# Patient Record
Sex: Female | Born: 1975 | Race: Black or African American | Hispanic: No | Marital: Married | Smoking: Never smoker
Health system: Southern US, Community
[De-identification: ages and names within clinical notes are randomized; demographics above are authoritative.]

## PROBLEM LIST (undated history)

## (undated) ENCOUNTER — Inpatient Hospital Stay (HOSPITAL_COMMUNITY): Payer: Self-pay

## (undated) DIAGNOSIS — K219 Gastro-esophageal reflux disease without esophagitis: Secondary | ICD-10-CM

## (undated) DIAGNOSIS — J45909 Unspecified asthma, uncomplicated: Secondary | ICD-10-CM

## (undated) DIAGNOSIS — I10 Essential (primary) hypertension: Secondary | ICD-10-CM

## (undated) HISTORY — PX: APPENDECTOMY: SHX54

## (undated) HISTORY — PX: TONSILLECTOMY: SUR1361

## (undated) HISTORY — PX: TOOTH EXTRACTION: SUR596

---

## 2001-10-06 HISTORY — PX: TONSILLECTOMY: SUR1361

## 2011-12-05 HISTORY — PX: DILATION AND CURETTAGE OF UTERUS: SHX78

## 2012-08-18 DIAGNOSIS — T781XXA Other adverse food reactions, not elsewhere classified, initial encounter: Secondary | ICD-10-CM | POA: Insufficient documentation

## 2012-08-18 DIAGNOSIS — Z91010 Allergy to peanuts: Secondary | ICD-10-CM | POA: Insufficient documentation

## 2012-11-01 ENCOUNTER — Encounter: Payer: Self-pay | Admitting: Emergency Medicine

## 2012-11-01 ENCOUNTER — Emergency Department
Admission: EM | Admit: 2012-11-01 | Discharge: 2012-11-01 | Disposition: A | Discharge status: Home / Self Care | Payer: Federal, State, Local not specified - PPO | Source: Home / Self Care | Attending: Family Medicine | Admitting: Family Medicine

## 2012-11-01 DIAGNOSIS — J45909 Unspecified asthma, uncomplicated: Secondary | ICD-10-CM

## 2012-11-01 DIAGNOSIS — J0100 Acute maxillary sinusitis, unspecified: Secondary | ICD-10-CM

## 2012-11-01 HISTORY — DX: Unspecified asthma, uncomplicated: J45.909

## 2012-11-01 MED ORDER — BENZONATATE 200 MG PO CAPS: 200.0000 mg | ORAL_CAPSULE | Freq: Every day | ORAL | Status: DC

## 2012-11-01 MED ORDER — AMOXICILLIN 875 MG PO TABS: 875.0000 mg | ORAL_TABLET | Freq: Two times a day (BID) | ORAL | Status: DC

## 2012-11-01 MED ORDER — ALBUTEROL SULFATE HFA 108 (90 BASE) MCG/ACT IN AERS: 2.0000 | INHALATION_SPRAY | RESPIRATORY_TRACT | Status: DC | PRN

## 2012-11-01 MED ORDER — PREDNISONE 20 MG PO TABS: 20.0000 mg | ORAL_TABLET | Freq: Two times a day (BID) | ORAL | Status: DC

## 2012-11-01 NOTE — ED Provider Notes (Signed)
History     CSN: [id number]  Arrival date & time 11/01/12  1753   First MD Initiated Contact with Patient 11/01/12 1903      Chief Complaint  Patient presents with  . Sinus Problem      HPI Comments: Patient reports that she has had persistent sinus congestion for about a month, and uses Rhinocort regularly.  She has asthma, and over the past four days has developed increased sinus congestion and facial pain, cough and increased wheezing, and chills.  The history is provided by the patient.    Past Medical History  Diagnosis Date  . Asthma     Past Surgical History  Procedure Date  . Tonsillectomy     No family history on file.  History  Substance Use Topics  . Smoking status: Never Smoker   . Smokeless tobacco: Not on file  . Alcohol Use: No    OB History    Grav Para Term Preterm Abortions TAB SAB Ect Mult Living                  Review of Systems + sore throat + cough No pleuritic pain + wheezing + nasal congestion + post-nasal drainage + sinus pain/pressure No itchy/red eyes No earache No hemoptysis + SOB No fever, + chills No nausea No vomiting No abdominal pain No diarrhea No urinary symptoms No skin rashes + fatigue + myalgias + headache Used OTC meds without relief  Allergies  Compazine and Latex  Home Medications   Current Outpatient Rx  Name  Route  Sig  Dispense  Refill  . ALBUTEROL SULFATE (2.5 MG/3ML) 0.083% IN NEBU   Nebulization   Take 2.5 mg by nebulization every 6 (six) hours as needed.         Marland Kitchen FLUTICASONE-SALMETEROL 100-50 MCG/DOSE IN AEPB   Inhalation   Inhale 1 puff into the lungs every 12 (twelve) hours.         Marland Kitchen HYDROCHLOROTHIAZIDE 25 MG PO TABS   Oral   Take 25 mg by mouth daily.         Marland Kitchen LORATADINE 10 MG PO TABS   Oral   Take 10 mg by mouth daily.         Marland Kitchen MONTELUKAST SODIUM 10 MG PO TABS   Oral   Take 10 mg by mouth at bedtime.         . ALBUTEROL SULFATE HFA 108 (90 BASE) MCG/ACT IN  AERS   Inhalation   Inhale 2 puffs into the lungs every 4 (four) hours as needed for wheezing.   1 Inhaler   1     Prefer Ventolin   . AMOXICILLIN 875 MG PO TABS   Oral   Take 1 tablet (875 mg total) by mouth 2 (two) times daily.   20 tablet   0   . BENZONATATE 200 MG PO CAPS   Oral   Take 1 capsule (200 mg total) by mouth at bedtime. Take as needed for cough   15 capsule   0   . PREDNISONE 20 MG PO TABS   Oral   Take 1 tablet (20 mg total) by mouth 2 (two) times daily.   10 tablet   0     BP 104/71  Pulse 70  Temp 98.4 F (36.9 C) (Oral)  Resp 16  Ht 5' 6.5" (1.689 m)  Wt 141 lb (63.957 kg)  BMI 22.42 kg/m2  SpO2 99%  LMP 10/18/2012  Physical Exam  Nursing notes and Vital Signs reviewed. Appearance:  Patient appears healthy, stated age, and in no acute distress Eyes:  Pupils are equal, round, and reactive to light and accomodation.  Extraocular movement is intact.  Conjunctivae are not inflamed  Ears:  Canals normal.  Tympanic membranes normal.  Nose:  Mildly congested turbinates.  Maxillary sinus tenderness is present.  Pharynx:  Normal Neck:  Supple.  Slightly tender shotty posterior nodes are palpated bilaterally  Lungs:  Clear to auscultation.  Breath sounds are equal.  Heart:  Regular rate and rhythm without murmurs, rubs, or gallops.  Abdomen:  Nontender without masses or hepatosplenomegaly.  Bowel sounds are present.  No CVA or flank tenderness.  Extremities:  No edema.  No calf tenderness Skin:  No rash present.   ED Course  Procedures  none      1. Acute maxillary sinusitis   2. Asthma       MDM  Begin amoxicillin and prednisone burst.  Prescription written for Benzonatate (Tessalon) to take at bedtime for night-time cough.  Take Mucinex D (guaifenesin with decongestant) twice daily for congestion.  Increase fluid intake, rest. May use Afrin nasal spray (or generic oxymetazoline) twice daily for about 5 days.  Also recommend using saline  nasal spray several times daily and saline nasal irrigation (AYR is a common brand).  Use Rhinocort nasal spray after Afrin and saline rinse. Stop all antihistamines for now, and other non-prescription cough/cold preparations. Continue all inhalers Follow-up with family doctor if not improving 7 to 10 days.         [name], MD 11/05/12 [phone]

## 2012-11-01 NOTE — ED Notes (Signed)
Sinus Pain, pressure, pressure behind eyes, headache, body aches, congestion, chills, SOB x 4 days

## 2012-11-16 ENCOUNTER — Encounter: Payer: Self-pay | Admitting: Family Medicine

## 2012-11-16 ENCOUNTER — Ambulatory Visit (INDEPENDENT_AMBULATORY_CARE_PROVIDER_SITE_OTHER): Payer: Federal, State, Local not specified - PPO | Admitting: Family Medicine

## 2012-11-16 VITALS — BP 118/73 | HR 91 | Ht 66.0 in | Wt 146.0 lb

## 2012-11-16 DIAGNOSIS — J45909 Unspecified asthma, uncomplicated: Secondary | ICD-10-CM

## 2012-11-16 DIAGNOSIS — J309 Allergic rhinitis, unspecified: Secondary | ICD-10-CM | POA: Insufficient documentation

## 2012-11-16 DIAGNOSIS — F419 Anxiety disorder, unspecified: Secondary | ICD-10-CM | POA: Insufficient documentation

## 2012-11-16 DIAGNOSIS — A499 Bacterial infection, unspecified: Secondary | ICD-10-CM

## 2012-11-16 DIAGNOSIS — J329 Chronic sinusitis, unspecified: Secondary | ICD-10-CM

## 2012-11-16 DIAGNOSIS — F32A Depression, unspecified: Secondary | ICD-10-CM | POA: Insufficient documentation

## 2012-11-16 DIAGNOSIS — M5030 Other cervical disc degeneration, unspecified cervical region: Secondary | ICD-10-CM | POA: Insufficient documentation

## 2012-11-16 DIAGNOSIS — K589 Irritable bowel syndrome without diarrhea: Secondary | ICD-10-CM | POA: Insufficient documentation

## 2012-11-16 DIAGNOSIS — I10 Essential (primary) hypertension: Secondary | ICD-10-CM

## 2012-11-16 DIAGNOSIS — B9689 Other specified bacterial agents as the cause of diseases classified elsewhere: Secondary | ICD-10-CM

## 2012-11-16 DIAGNOSIS — J4550 Severe persistent asthma, uncomplicated: Secondary | ICD-10-CM | POA: Insufficient documentation

## 2012-11-16 DIAGNOSIS — J4540 Moderate persistent asthma, uncomplicated: Secondary | ICD-10-CM | POA: Insufficient documentation

## 2012-11-16 MED ORDER — THEOPHYLLINE ER 400 MG PO CP24: 400.0000 mg | ORAL_CAPSULE | Freq: Every day | ORAL | Status: DC

## 2012-11-16 MED ORDER — LEVOFLOXACIN 500 MG PO TABS: 500.0000 mg | ORAL_TABLET | Freq: Every day | ORAL | Status: AC

## 2012-11-16 MED ORDER — ALBUTEROL SULFATE HFA 108 (90 BASE) MCG/ACT IN AERS: 2.0000 | INHALATION_SPRAY | RESPIRATORY_TRACT | Status: DC | PRN

## 2012-11-16 MED ORDER — HYDROCHLOROTHIAZIDE 25 MG PO TABS: 25.0000 mg | ORAL_TABLET | Freq: Every day | ORAL | Status: DC

## 2012-11-16 MED ORDER — FLUTICASONE-SALMETEROL 500-50 MCG/DOSE IN AEPB: 1.0000 | INHALATION_SPRAY | Freq: Two times a day (BID) | RESPIRATORY_TRACT | Status: DC

## 2012-11-16 MED ORDER — MONTELUKAST SODIUM 10 MG PO TABS: 10.0000 mg | ORAL_TABLET | Freq: Every day | ORAL | Status: DC

## 2012-11-16 NOTE — Progress Notes (Signed)
CC: Toni Harmon is a 37 y.o. female is here for Establish Toni   Subjective: HPI:  Toni Harmon here to establish Toni. Toni Harmon.  History of asthma: This is been present ever since her teens. She's currently on Advair, theophylline, Singulair and albuterol. She reports no need to use albuterol at night or while sleeping. She reports using it twice a day on a scheduled regimen.  Triggers include pollen and dust environments such as her work place at the [employer]. This is improved if she wears a mask or respirator. She's never been hospitalized for asthma, she is never been intubated for asthma. Her immunologist has told her that they may take her off of theophylline in the near future. She was on prednisone for a sinus infection last week. She denies wheezing, cough, chest congestion, wheezing, chest pain, lightheadedness. She denies tremor, nor unintentional weight loss. Interestingly she was given a diagnosis for hypertension around the time she was started on theophylline.  History of irritable bowel syndrome: She reports almost weekly episodes of abdominal pain that is improved with bowel movements. She reports both constipation and loose stools with the above symptoms. She has noticed improvement in her symptoms ever since cutting back on gluten and increasing vegetables in her diet. She does not take a fiber supplement. She once tried MiraLax but made symptoms worse from a dietary standpoint. She denies waking with abdominal pain at night, blood in the stool,tar like stools, food aversions, nausea, nor vomiting.  History allergic rhinitis: Currently taking Zyrtec and Rhinocort on a daily basis. Continues to have pressure underneath both eyes it is worse when lying down flat. Moderate in severity. No improvement whatsoever on amoxicillin 2 weeks ago. She reports clear to thick nasal discharge. She is using nasal saline which helps with the pain  in the face, nothing else makes better or worse.   essential hypertension: No outside blood pressures to report. Taking hydrochlorothiazide twice a day for years now. She believes her blood pressure has been fantastic ever since starting this medication.   Review of Systems - General ROS: negative for - chills, fever, night sweats, weight gain or weight loss Ophthalmic ROS: negative for - decreased vision Psychological ROS: negative for - anxiety or depression ENT ROS: negative for - hearing change tinnitus  Hematological and Lymphatic ROS: negative for - bleeding problems, bruising or swollen lymph nodes Breast ROS: negative Respiratory ROS: no cough, shortness of breath, or wheezing Cardiovascular ROS: no chest pain or dyspnea on exertion Gastrointestinal ROS: no  black or bloody stools Genito-Urinary ROS: negative for - genital discharge, genital ulcers, incontinence or abnormal bleeding from genitals Musculoskeletal ROS: negative for - joint pain or muscle pain Neurological ROS: negative for - headaches or memory loss Dermatological ROS: negative for lumps, mole changes, rash and skin lesion changes  Past Medical History  Diagnosis Date  . Asthma      No family history on file.   History  Substance Use Topics  . Smoking status: Never Smoker   . Smokeless tobacco: Not on file  . Alcohol Use: No     Objective: Filed Vitals:   11/16/12 1319  BP: 118/73  Pulse: 91    General: Alert and Oriented, No Acute Distress HEENT: Pupils equal, round, reactive to light. Conjunctivae clear.  External ears unremarkable, canals clear with intact TMs with appropriate landmarks.  Middle ear appears open without effusion. erythematous and boggy inferior turbinates with mild mucoid  discharge.  Moist mucous membranes, pharynx without inflammation nor lesions.  Neck supple without palpable lymphadenopathy nor abnormal masses. Lungs: Clear to auscultation bilaterally, no wheezing/ronchi/rales.   Comfortable work of breathing. Good air movement. Cardiac: Regular rate and rhythm. Normal S1/S2.  No murmurs, rubs, nor gallops.   Extremities: No peripheral edema.  Strong peripheral pulses.  Mental Status: No depression, anxiety, nor agitation. Skin: Warm and dry.  Assessment & Plan: Artesha was seen today for establish Toni.  Diagnoses and associated orders for this visit:  Essential hypertension, benign - hydrochlorothiazide (HYDRODIURIL) 25 MG tablet; Take 1 tablet (25 mg total) by mouth daily.  Asthma, chronic - albuterol (PROVENTIL HFA;VENTOLIN HFA) 108 (90 BASE) MCG/ACT inhaler; Inhale 2 puffs into the lungs every 4 (four) hours as needed for wheezing. Please supply Ventolin HFA - Fluticasone-Salmeterol (ADVAIR DISKUS) 500-50 MCG/DOSE AEPB; Inhale 1 puff into the lungs 2 (two) times daily. - montelukast (SINGULAIR) 10 MG tablet; Take 1 tablet (10 mg total) by mouth at bedtime. - theophylline (THEO-24) 400 MG 24 hr capsule; Take 1 capsule (400 mg total) by mouth daily.  Allergic rhinitis  IBS (irritable bowel syndrome)  Bacterial sinusitis - levofloxacin (LEVAQUIN) 500 MG tablet; Take 1 tablet (500 mg total) by mouth daily.  Other Orders - Discontinue: Fluticasone-Salmeterol (ADVAIR DISKUS) 500-50 MCG/DOSE AEPB; Inhale 1 puff into the lungs 2 (two) times daily. - Discontinue: theophylline (THEO-24) 400 MG 24 hr capsule; Take 1 capsule (400 mg total) by mouth daily.    Essential hypertension: Controlled, continue hydrochlorothiazide, discussed that she may be able to stop this once theophylline was discontinued. Asthma: Controlled and stable, continue albuterol, Singulair, Advair, he often however encouraged her to stop this under the guidance of her immunologist if the opportunity arises. Allergic rhinitis: Stable continue Zyrtec and Rhinocort IBS: Stable, strongly encouraged patient to add one heaping tablespoon of psyllum  powder to her diet daily to help minimize  future exacerbation frequency and severity Bacterial sinusitis: Deteriorated, we'll stepup therapy beyond amoxicillin, start Levaquin, continue nasal saline washes  FMLA paperwork was filled out for her asthma and IBS.  45 minutes spent face-to-face during visit today of which at least 50% was counseling or coordinating Toni regarding allergic rhinitis, asthma, IBS, bacterial sinusitis, essential hypertension.  Will hold off on labs until recent blood work is obtained from outside records to minimize unnecessary lab work  Return in about 3 months (around 02/13/2013).

## 2013-04-12 ENCOUNTER — Inpatient Hospital Stay (HOSPITAL_COMMUNITY): Payer: Federal, State, Local not specified - PPO

## 2013-04-12 ENCOUNTER — Encounter (HOSPITAL_COMMUNITY): Payer: Self-pay | Admitting: *Deleted

## 2013-04-12 ENCOUNTER — Inpatient Hospital Stay (HOSPITAL_COMMUNITY)
Admission: AD | Admit: 2013-04-12 | Discharge: 2013-04-12 | Disposition: A | Discharge status: Home / Self Care | Payer: Federal, State, Local not specified - PPO | Source: Ambulatory Visit | Attending: Family Medicine | Admitting: Family Medicine

## 2013-04-12 DIAGNOSIS — R109 Unspecified abdominal pain: Secondary | ICD-10-CM | POA: Insufficient documentation

## 2013-04-12 DIAGNOSIS — O039 Complete or unspecified spontaneous abortion without complication: Secondary | ICD-10-CM | POA: Insufficient documentation

## 2013-04-12 HISTORY — DX: Essential (primary) hypertension: I10

## 2013-04-12 HISTORY — DX: Gastro-esophageal reflux disease without esophagitis: K21.9

## 2013-04-12 LAB — CBC
HCT: 37.7 % (ref 36.0–46.0)
MCH: 28.5 pg (ref 26.0–34.0)
MCHC: 34.2 g/dL (ref 30.0–36.0)
MCV: 83.4 fL (ref 78.0–100.0)
Platelets: 336 10*3/uL (ref 150–400)
RDW: 12.7 % (ref 11.5–15.5)

## 2013-04-12 MED ORDER — IBUPROFEN 600 MG PO TABS: 600.0000 mg | ORAL_TABLET | Freq: Four times a day (QID) | ORAL | Status: DC | PRN

## 2013-04-12 NOTE — MAU Provider Note (Signed)
Chart reviewed and agree with management and plan.  

## 2013-04-12 NOTE — MAU Note (Addendum)
Pt G3 P0 , LMP 02/17/2013, +UPT, started spotting 1 wk ago, was seen at [facility], had an U/S performed, "everything looked good."  Cyst on left ovary was noted.  Bleeding and cramping increased yesterday.

## 2013-04-12 NOTE — MAU Note (Signed)
Bleeding and cramping started one week ago and worsened five days ago.  Says she has been bleeding moderately, approximately one pad/hour since last Wednesday.

## 2013-04-12 NOTE — MAU Provider Note (Signed)
Chief Complaint: Vaginal Bleeding and Abdominal Cramping   First Provider Initiated Contact with Patient 04/12/13 0216     SUBJECTIVE HPI: Toni Harmon is a 37 y.o. G3P0020 at 7.5 weeks by LMP who presents with moderate vaginal bleeding and low abd/back cramping. Bleeding now heavier than the beginning of a period, although minimal since arrival to MAU. She was seen at [facility] 1 week ago for spotting. States US showed intrauterine pregnancy, but baby was not visible. States she had blood work, pelvic exam, cultures done.   Past Medical History  Diagnosis Date  . Asthma   . GERD (gastroesophageal reflux disease)   . Hypertension     only to combat the effects of hctz   OB History   Grav Para Term Preterm Abortions TAB SAB Ect Mult Living   3    2 1 1    0     # Outc Date GA Lbr Len/2nd Wgt Sex Del Anes PTL Lv   1 TAB            2 SAB            3 CUR              Past Surgical History  Procedure Laterality Date  . Tonsillectomy     History   Social History  . Marital Status: Single    Spouse Name: N/A    Number of Children: N/A  . Years of Education: N/A   Occupational History  . Not on file.   Social History Main Topics  . Smoking status: Never Smoker   . Smokeless tobacco: Not on file  . Alcohol Use: No  . Drug Use: No  . Sexually Active: Not Currently    Birth Control/ Protection: None   Other Topics Concern  . Not on file   Social History Narrative  . No narrative on file   No current facility-administered medications on file prior to encounter.   Current Outpatient Prescriptions on File Prior to Encounter  Medication Sig Dispense Refill  . albuterol (PROVENTIL HFA;VENTOLIN HFA) 108 (90 BASE) MCG/ACT inhaler Inhale 2 puffs into the lungs every 4 (four) hours as needed for wheezing. Please supply Ventolin HFA  1 Inhaler  1  . Fluticasone-Salmeterol (ADVAIR DISKUS) 500-50 MCG/DOSE AEPB Inhale 1 puff into the lungs 2 (two) times  daily.  1 each  3  . hydrochlorothiazide (HYDRODIURIL) 25 MG tablet Take 1 tablet (25 mg total) by mouth daily.  90 tablet  3  . loratadine (CLARITIN) 10 MG tablet Take 10 mg by mouth daily.      . montelukast (SINGULAIR) 10 MG tablet Take 1 tablet (10 mg total) by mouth at bedtime.  90 tablet  3  . albuterol (PROVENTIL) (2.5 MG/3ML) 0.083% nebulizer solution Take 2.5 mg by nebulization every 6 (six) hours as needed.      . theophylline (THEO-24) 400 MG 24 hr capsule Take 1 capsule (400 mg total) by mouth daily.  90 capsule  1   Allergies  Allergen Reactions  . Compazine (Prochlorperazine Edisylate)   . Latex   . Peanut-Containing Drug Products     ROS: Pertinent positive items in HPI. Neg for passage of clots or tissue, fever, chills, vaginal discharge, urinary complaints, GI complaints.   OBJECTIVE Blood pressure 112/64, pulse 80, temperature 97.9 F (36.6 C), temperature source Oral, resp. rate 16, height 5' 6.8" (1.697 m), weight 72.576 kg (160 lb), last menstrual period 02/17/2013. GENERAL: Well-developed,  well-nourished female in no acute distress.  HEENT: Normocephalic HEART: normal rate RESP: normal effort ABDOMEN: Soft, non-tender EXTREMITIES: Nontender, no edema NEURO: Alert and oriented SPECULUM EXAM: NEFG, small amount of dark red blood noted in vault. Scant active bleeding from os. Cervix clean. BIMANUAL: cervix closed; uterus ? Slightly enlarged, no adnexal tenderness or masses. No CMT. Declined GC/CT cultures.  LAB RESULTS Results for orders placed during the hospital encounter of 04/12/13 (from the past 24 hour(s))  POCT PREGNANCY, URINE     Status: Abnormal   Collection Time    04/12/13  1:53 AM      Result Value Range   Preg Test, Ur POSITIVE (*) NEGATIVE  ABO/RH     Status: None   Collection Time    04/12/13  1:55 AM      Result Value Range   ABO/RH(D) B POS    CBC     Status: None   Collection Time    04/12/13  1:55 AM      Result Value Range   WBC 5.3   4.0 - 10.5 K/uL   RBC 4.52  3.87 - 5.11 MIL/uL   Hemoglobin 12.9  12.0 - 15.0 g/dL   HCT 45.4  09.8 - 11.9 %   MCV 83.4  78.0 - 100.0 fL   MCH 28.5  26.0 - 34.0 pg   MCHC 34.2  30.0 - 36.0 g/dL   RDW 14.7  82.9 - 56.2 %   Platelets 336  150 - 400 K/uL  HCG, QUANTITATIVE, PREGNANCY     Status: Abnormal   Collection Time    04/12/13  1:55 AM      Result Value Range   hCG, Beta Chain, Quant, S 192 (*) <5 mIU/mL    IMAGING US Ob Comp Less 14 Wks  04/12/2013   *RADIOLOGY REPORT*  Clinical Data: Early pregnancy with cramping and bleeding. Estimated gestational age by LMP is 7 weeks 5 days.  Quantitative beta HCG is 192.  OBSTETRIC <14 WK [place] AND TRANSVAGINAL OB US  Technique:  Both transabdominal and transvaginal ultrasound examinations were performed for complete evaluation of the gestation as well as the maternal uterus, adnexal regions, and pelvic cul-de-sac.  Transvaginal technique was performed to assess early pregnancy.  Comparison:  None.  Intrauterine gestational sac:  Multiple tiny cysts are demonstrated in the endometrium.  One or more of these could be early gestational sacs but are not definitive. Yolk sac: Not visualized Embryo: Not visualized Cardiac Activity: Not visualized  Maternal uterus/adnexae: Uterine myometrium appears homogeneous.  No myometrial mass lesions.  Both ovaries are visualized.  The right ovary measures 3 x 1.6 x 2 cm.  The left ovary measures 3.1 x 1.8 x 1.7 cm.  Normal follicular changes are demonstrated bilaterally.  No abnormal adnexal masses.  No free pelvic fluid collections.  IMPRESSION: Several small fluid collections within the endometrium are nonspecific.  Intrauterine pregnancy is not confirmed.  Follow up ultrasound is recommended in 10 days.   Original Report Authenticated By: Burman Nieves, M.D.   US Ob Transvaginal  04/12/2013   *RADIOLOGY REPORT*  Clinical Data: Early pregnancy with cramping and bleeding. Estimated gestational age by LMP is 7 weeks 5  days.  Quantitative beta HCG is 192.  OBSTETRIC <14 WK [place] AND TRANSVAGINAL OB US  Technique:  Both transabdominal and transvaginal ultrasound examinations were performed for complete evaluation of the gestation as well as the maternal uterus, adnexal regions, and pelvic cul-de-sac.  Transvaginal technique was  performed to assess early pregnancy.  Comparison:  None.  Intrauterine gestational sac:  Multiple tiny cysts are demonstrated in the endometrium.  One or more of these could be early gestational sacs but are not definitive. Yolk sac: Not visualized Embryo: Not visualized Cardiac Activity: Not visualized  Maternal uterus/adnexae: Uterine myometrium appears homogeneous.  No myometrial mass lesions.  Both ovaries are visualized.  The right ovary measures 3 x 1.6 x 2 cm.  The left ovary measures 3.1 x 1.8 x 1.7 cm.  Normal follicular changes are demonstrated bilaterally.  No abnormal adnexal masses.  No free pelvic fluid collections.  IMPRESSION: Several small fluid collections within the endometrium are nonspecific.  Intrauterine pregnancy is not confirmed.  Follow up ultrasound is recommended in 10 days.   Original Report Authenticated By: Burman Nieves, M.D.   MAU COURSE Records requested from [facility].  Per [facility] records 04/04/13:  Quant 4073  [place]: 5.5 week GS. No YS or FP. Left CLC. Multiple cystic areas in the endometrium.   ASSESSMENT 1. Miscarriage- IUP not confirmed    PLAN Discharge home in stable condition. Support given. Bleeding, pain , fever precautions.      Follow-up Information   Follow up with [facility] In 1 week. (or you Ob/Gyn office to check pregnancy hormone level.)    Contact information:   [address] [place] [address] [phone]      Follow up with [facility] [place] [facility] [place] MATERNITY ADMISSIONS. (As needed for severe bleeding, pain or fever greater than 100.4. )    Contact information:   [address] [id number] [place] [address] [phone]       Medication List         albuterol 108 (90 BASE) MCG/ACT inhaler  Commonly known as:  PROVENTIL HFA;VENTOLIN HFA  Inhale 2 puffs into the lungs every 4 (four) hours as needed for wheezing. Please supply Ventolin HFA     albuterol (2.5 MG/3ML) 0.083% nebulizer solution  Commonly known as:  PROVENTIL  Take 2.5 mg by nebulization every 6 (six) hours as needed.     Fluticasone-Salmeterol 500-50 MCG/DOSE Aepb  Commonly known as:  ADVAIR DISKUS  Inhale 1 puff into the lungs 2 (two) times daily.     hydrochlorothiazide 25 MG tablet  Commonly known as:  HYDRODIURIL  Take 1 tablet (25 mg total) by mouth daily.     ibuprofen 600 MG tablet  Commonly known as:  ADVIL,MOTRIN  Take 1 tablet (600 mg total) by mouth every 6 (six) hours as needed for pain.     loratadine 10 MG tablet  Commonly known as:  CLARITIN  Take 10 mg by mouth daily.     montelukast 10 MG tablet  Commonly known as:  SINGULAIR  Take 1 tablet (10 mg total) by mouth at bedtime.     pseudoephedrine 30 MG tablet  Commonly known as:  SUDAFED  Take 30 mg by mouth every 4 (four) hours as needed for congestion.     theophylline 400 MG 24 hr capsule  Commonly known as:  THEO-24  Take 1 capsule (400 mg total) by mouth daily.       [place], CNM 04/12/2013  3:40 AM

## 2013-04-22 ENCOUNTER — Other Ambulatory Visit: Payer: Federal, State, Local not specified - PPO

## 2013-04-22 DIAGNOSIS — O039 Complete or unspecified spontaneous abortion without complication: Secondary | ICD-10-CM

## 2013-04-23 LAB — HCG, QUANTITATIVE, PREGNANCY: hCG, Beta Chain, Quant, S: 4.8 m[IU]/mL

## 2013-04-28 ENCOUNTER — Ambulatory Visit (INDEPENDENT_AMBULATORY_CARE_PROVIDER_SITE_OTHER): Payer: Federal, State, Local not specified - PPO | Admitting: Family Medicine

## 2013-04-28 ENCOUNTER — Encounter: Payer: Self-pay | Admitting: Family Medicine

## 2013-04-28 VITALS — BP 110/77 | HR 71 | Temp 97.9°F | Ht 66.0 in | Wt 158.7 lb

## 2013-04-28 DIAGNOSIS — O039 Complete or unspecified spontaneous abortion without complication: Secondary | ICD-10-CM

## 2013-04-28 NOTE — Progress Notes (Signed)
Subjective:     Patient ID: Blanchard Kelch, female   DOB: 11-Mar-1976, 37 y.o.   MRN: [id number]  HPI  Ms. Franzoni is a 37 yo G3P0030 who is here for f/u HCG.    Was seen in MAU on 7/8 at [redacted]w[redacted]d for severe cramping and bleeding.  [place] at that time showed Several small fluid collections within the endometrium are  nonspecific. Intrauterine pregnancy is not confirmed. Quant 192  Prior quant and [place] Per [facility] records 04/04/13:  Quant 4073  [place]: 5.5 week GS. No YS or FP. Left CLC. Multiple cystic areas in the endometrium.  Was to follow up in a week with [place] for repeat Quant but did not. Stopped bleeding on 7/9. Has done well since then, feels physically normal. Emotionally doing well but sometimes still feeling down.  Taking a break for a couple months and going to use condoms and then try again.      Review of Systems No fevers, chills, abd pain, vaginal bleeding.  No headaches, sob.     Objective:   Physical Exam  Filed Vitals:   04/28/13 1545  BP: 110/77  Pulse: 71  Temp: 97.9 F (36.6 C)  Height: 5\' 6"  (1.676 m)  Weight: 158 lb 11.2 oz (71.986 kg)   GEN: NAD ABD: soft, NT, ND.  GU: deferred     Assessment:     Spontaneous abortion in first trimester - Plan: B-HCG Quant       Plan:     - completed abortion.  Based on [place] and story this may have been more a bligthed ovum - will repeat HCG  - discussed options for contraception - pt opted for condoms - may need further infertility workup in the future should she choose to pursue this.   Yanci Bachtell, [place] Baseman, MD

## 2013-04-28 NOTE — Patient Instructions (Signed)
Miscarriage A miscarriage is the sudden loss of an unborn baby (fetus) before the 20th week of pregnancy. Most miscarriages happen in the first 3 months of pregnancy. Sometimes, it happens before a woman even knows she is pregnant. A miscarriage is also called a "spontaneous miscarriage" or "early pregnancy loss." Having a miscarriage can be an emotional experience. Talk with your caregiver about any questions you may have about miscarrying, the grieving process, and your future pregnancy plans. CAUSES   Problems with the fetal chromosomes that make it impossible for the baby to develop normally. Problems with the baby's genes or chromosomes are most often the result of errors that occur, by chance, as the embryo divides and grows. The problems are not inherited from the parents.  Infection of the cervix or uterus.   Hormone problems.   Problems with the cervix, such as having an incompetent cervix. This is when the tissue in the cervix is not strong enough to hold the pregnancy.   Problems with the uterus, such as an abnormally shaped uterus, uterine fibroids, or congenital abnormalities.   Certain medical conditions.   Smoking, drinking alcohol, or taking illegal drugs.   Trauma.  Often, the cause of a miscarriage is unknown.  SYMPTOMS   Vaginal bleeding or spotting, with or without cramps or pain.  Pain or cramping in the abdomen or lower back.  Passing fluid, tissue, or blood clots from the vagina. DIAGNOSIS  Your caregiver will perform a physical exam. You may also have an ultrasound to confirm the miscarriage. Blood or urine tests may also be ordered. TREATMENT   Sometimes, treatment is not necessary if you naturally pass all the fetal tissue that was in the uterus. If some of the fetus or placenta remains in the body (incomplete miscarriage), tissue left behind may become infected and must be removed. Usually, a dilation and curettage (D and C) procedure is performed.  During a D and C procedure, the cervix is widened (dilated) and any remaining fetal or placental tissue is gently removed from the uterus.  Antibiotic medicines are prescribed if there is an infection. Other medicines may be given to reduce the size of the uterus (contract) if there is a lot of bleeding.  If you have Rh negative blood and your baby was Rh positive, you will need a Rh immunoglobulin shot. This shot will protect any future baby from having Rh blood problems in future pregnancies. HOME CARE INSTRUCTIONS   Your caregiver may order bed rest or may allow you to continue light activity. Resume activity as directed by your caregiver.  Have someone help with home and family responsibilities during this time.   Keep track of the number of sanitary pads you use each day and how soaked (saturated) they are. Write down this information.   Do not use tampons. Do not douche or have sexual intercourse until approved by your caregiver.   Only take over-the-counter or prescription medicines for pain or discomfort as directed by your caregiver.   Do not take aspirin. Aspirin can cause bleeding.   Keep all follow-up appointments with your caregiver.   If you or your partner have problems with grieving, talk to your caregiver or seek counseling to help cope with the pregnancy loss. Allow enough time to grieve before trying to get pregnant again.  SEEK IMMEDIATE MEDICAL CARE IF:   You have severe cramps or pain in your back or abdomen.  You have a fever.  You pass large blood clots (walnut-sized   or larger) ortissue from your vagina. Save any tissue for your caregiver to inspect.   Your bleeding increases.   You have a thick, bad-smelling vaginal discharge.  You become lightheaded, weak, or you faint.   You have chills.  MAKE SURE YOU:  Understand these instructions.  Will watch your condition.  Will get help right away if you are not doing well or get  worse. Document Released: 03/18/2001 Document Revised: 03/23/2012 Document Reviewed: 11/11/2011 [employer] Patient Information 2014 [employer].  

## 2013-04-29 ENCOUNTER — Other Ambulatory Visit: Payer: Federal, State, Local not specified - PPO

## 2013-10-26 ENCOUNTER — Ambulatory Visit (INDEPENDENT_AMBULATORY_CARE_PROVIDER_SITE_OTHER): Payer: Federal, State, Local not specified - PPO | Admitting: Family Medicine

## 2013-10-26 ENCOUNTER — Encounter: Payer: Self-pay | Admitting: Family Medicine

## 2013-10-26 VITALS — BP 126/82 | HR 74 | Temp 97.8°F | Wt 151.0 lb

## 2013-10-26 DIAGNOSIS — J45909 Unspecified asthma, uncomplicated: Secondary | ICD-10-CM

## 2013-10-26 DIAGNOSIS — J309 Allergic rhinitis, unspecified: Secondary | ICD-10-CM

## 2013-10-26 MED ORDER — AMBULATORY NON FORMULARY MEDICATION: Status: DC

## 2013-10-26 MED ORDER — MONTELUKAST SODIUM 10 MG PO TABS: 10.0000 mg | ORAL_TABLET | Freq: Every day | ORAL | Status: DC

## 2013-10-26 MED ORDER — LEVOCETIRIZINE DIHYDROCHLORIDE 2.5 MG/5ML PO SOLN: ORAL | Status: DC

## 2013-10-26 MED ORDER — FLUTICASONE-SALMETEROL 500-50 MCG/DOSE IN AEPB
1.0000 | INHALATION_SPRAY | Freq: Two times a day (BID) | RESPIRATORY_TRACT | Status: DC
Start: 2013-10-26 — End: 2015-02-05

## 2013-10-26 MED ORDER — ALBUTEROL SULFATE HFA 108 (90 BASE) MCG/ACT IN AERS: 2.0000 | INHALATION_SPRAY | RESPIRATORY_TRACT | Status: DC | PRN

## 2013-10-26 NOTE — Progress Notes (Signed)
CC: Blanchard Toni Harmon is a 38 y.o. female is here for Medication Management   Subjective: HPI:  Followup of asthma: She is requesting refills on Ventolin, Advair, Singulair she has not run out of any of these medications however abdomen was switched to generic albuterol which she believes is not helping. She is using albuterol inhaler 2 puffs 3-4 times a day for shortness of breath and chest tightness. Symptoms are relieved only for matter of one-to hours.  Denies any nocturnal symptoms. She has had one asthma exacerbation in the last 12 months. Symptoms are worse when at work due to in part are allergens including but not limited to dust.  Denies fevers, chills, nor confusion.  She is requesting a [facility]bulizer which [facility] not [facility] with since her move from [place].  She complains of nasal congestion that is persistent on a daily basis present all hours of the day no response to Allegra, Benadryl, Claritin, significantly improved with Zyrtec however as small as 2.5 mg will cause her sedation to where she has to fall asleep.  Denies cough, facial pressure, nor sore throat    Review Of Systems Outlined In HPI  Past Medical History  Diagnosis Date  . Asthma   . GERD (gastroesophageal reflux disease)   . Hypertension     only to combat the effects of hctz     Family History  Problem Relation Age of Onset  . Cancer Mother   . Hypertension Mother   . Diabetes Maternal Grandfather      History  Substance Use Topics  . Smoking status: Never Smoker   . Smokeless tobacco: Not on file  . Alcohol Use: No     Objective: Filed Vitals:   10/26/13 1125  BP: 126/82  Pulse: 74  Temp: 97.8 F (36.6 C)    General: Alert and Oriented, No Acute Distress HEENT: Pupils equal, round, reactive to light. Conjunctivae clear.  External ears unremarkable, canals clear with intact TMs with appropriate landmarks.  Middle ear appears open without effusion. Boggy erythematous inferior  turbinates.  Moist mucous membranes, pharynx without inflammation nor lesions.  Neck supple without palpable lymphadenopathy nor abnormal masses. Lungs: Clear to auscultation bilaterally, no wheezing/ronchi/rales.  Comfortable work of breathing. Good air movement. Cardiac: Regular rate and rhythm. Normal S1/S2.  No murmurs, rubs, nor gallops.   Mental Status: No depression, anxiety, nor agitation. Skin: Warm and dry.  Assessment & Plan: Toni Harmon was seen today for medication management.  Diagnoses and associated orders for this visit:  Asthma, chronic - montelukast (SINGULAIR) 10 MG tablet; Take 1 tablet (10 mg total) by mouth at bedtime. - levocetirizine (XYZAL) 2.5 MG/5ML solution; 2.5-975mL every evening for allergies. - albuterol (PROVENTIL HFA;VENTOLIN HFA) 108 (90 BASE) MCG/ACT inhaler; Inhale 2 puffs into the lungs every 4 (four) hours as needed for wheezing. Please supply Ventolin HFA - Discontinue: AMBULATORY NON FORMULARY MEDICATION; Nebulizer with necessary accessories.  Dx: Moderate Persistent Asthma - AMBULATORY NON FORMULARY MEDICATION; Nebulizer with necessary accessories.  Dx: Moderate Persistent Asthma - Fluticasone-Salmeterol (ADVAIR DISKUS) 500-50 MCG/DOSE AEPB; Inhale 1 puff into the lungs 2 (two) times daily.  Allergic rhinitis    Chronic asthma currently classified as moderate persistent and uncontrolled chronic condition, continue Singulair, albuterol specifically Ventolin, Advair.  We'll send a request/prescription for a home nebulizer with supplies to a respiratory company that is compatible with her insurance Allergic rhinitis: Uncontrolled stop Claritin instead start xyzal in hopes of minimizing sedation  Return in about 4  weeks (around 11/23/2013) for Asthma.

## 2014-01-10 ENCOUNTER — Encounter: Payer: Self-pay | Admitting: Family Medicine

## 2014-01-10 ENCOUNTER — Ambulatory Visit (INDEPENDENT_AMBULATORY_CARE_PROVIDER_SITE_OTHER): Payer: Federal, State, Local not specified - PPO | Admitting: Family Medicine

## 2014-01-10 VITALS — BP 112/69 | HR 70 | Wt 140.0 lb

## 2014-01-10 DIAGNOSIS — J309 Allergic rhinitis, unspecified: Secondary | ICD-10-CM

## 2014-01-10 DIAGNOSIS — I10 Essential (primary) hypertension: Secondary | ICD-10-CM

## 2014-01-10 DIAGNOSIS — J45909 Unspecified asthma, uncomplicated: Secondary | ICD-10-CM

## 2014-01-10 MED ORDER — MONTELUKAST SODIUM 10 MG PO TABS: 10.0000 mg | ORAL_TABLET | Freq: Every day | ORAL | Status: DC

## 2014-01-10 MED ORDER — HYDROCHLOROTHIAZIDE 25 MG PO TABS: 25.0000 mg | ORAL_TABLET | Freq: Every day | ORAL | Status: DC

## 2014-01-10 MED ORDER — BECLOMETHASONE DIPROPIONATE 80 MCG/ACT NA AERS: INHALATION_SPRAY | NASAL | Status: DC

## 2014-01-10 MED ORDER — ALBUTEROL SULFATE HFA 108 (90 BASE) MCG/ACT IN AERS: 2.0000 | INHALATION_SPRAY | RESPIRATORY_TRACT | Status: DC | PRN

## 2014-01-10 NOTE — Progress Notes (Signed)
CC: Toni Harmon is a 38 y.o. female is here for asthma f/u   Subjective: HPI:  Followup asthma: Continues to use Ventolin, Singulair, Advair without missed doses. Uses Ventolin 2-3 times a day this past week since pollen counts have been increasing. Prior to this week was using less than most days of the week only once a day.  Describes symptoms as cough and wheezing moderate in severity which resolves with Ventolin use. Denies fevers, chills, blood in sputum, sputum production, chest pain, confusion.  Followup allergic rhinitis: At her last visit we switched Zyrtec to xyzal which also caused intolerable fatigue. She's been using Claritin 10 mg twice a day which helps with itchy eyes and sneezing but not so much with nasal congestion and postnasal drip. She's using Rhinocort on a daily basis which mildly improved symptoms but symptoms are still a moderate severity on a daily basis all hours of the day. Similar symptoms were not controlled with Nasacort nor Flonase.  Followup essential hypertension: Continues on hydrochlorothiazide on a daily basis without missed doses and no outside blood pressures to report. Denies chest pain, orthopnea, peripheral edema, nor motor sensory disturbances   Review Of Systems Outlined In HPI  Past Medical History  Diagnosis Date  . Asthma   . GERD (gastroesophageal reflux disease)   . Hypertension     only to combat the effects of hctz    Past Surgical History  Procedure Laterality Date  . Tonsillectomy     Family History  Problem Relation Age of Onset  . Cancer Mother   . Hypertension Mother   . Diabetes Maternal Grandfather     History   Social History  . Marital Status: Single    Spouse Name: N/A    Number of Children: N/A  . Years of Education: N/A   Occupational History  . Not on file.   Social History Main Topics  . Smoking status: Never Smoker   . Smokeless tobacco: Not on file  . Alcohol Use: No  . Drug Use: No  . Sexual  Activity: Not Currently    Birth Control/ Protection: None   Other Topics Concern  . Not on file   Social History Narrative  . No narrative on file     Objective: BP 112/69  Pulse 70  Wt 140 lb (63.504 kg)  LMP 02/17/2013  General: Alert and Oriented, No Acute Distress HEENT: Pupils equal, round, reactive to light. Conjunctivae clear.  External ears unremarkable, canals clear with intact TMs with appropriate landmarks.  Middle ear appears open without effusion. Boggy erythematous inferior turbinates.  Moist mucous membranes, pharynx without inflammation nor lesions.  Neck supple without palpable lymphadenopathy nor abnormal masses. Lungs: Clear to auscultation bilaterally, no wheezing/ronchi/rales.  Comfortable work of breathing. Good air movement. Cardiac: Regular rate and rhythm. Normal S1/S2.  No murmurs, rubs, nor gallops.   Mental Status: No depression, anxiety, nor agitation. Skin: Warm and dry.  Assessment & Plan: [name] was seen today for asthma f/u.  Diagnoses and associated orders for this visit:  Asthma, chronic - montelukast (SINGULAIR) 10 MG tablet; Take 1 tablet (10 mg total) by mouth at bedtime. - albuterol (PROVENTIL HFA;VENTOLIN HFA) 108 (90 BASE) MCG/ACT inhaler; Inhale 2 puffs into the lungs every 4 (four) hours as needed for wheezing. Please supply Ventolin HFA  Essential hypertension, benign - hydrochlorothiazide (HYDRODIURIL) 25 MG tablet; Take 1 tablet (25 mg total) by mouth daily.  Allergic rhinitis  Other Orders - Beclomethasone Dipropionate (QNASL) 80 MCG/ACT  AERS; 2 sprays each nostril daily    Asthma: Uncontrolled moderate persistent, continue on Advair, Ventolin, Singulair we will try to get better control of her seasonal allergies in hopes of this helps minimize a need for albuterol Allergic rhinitis: Uncontrolled stop Rhinocort Qnasl samples provided as substitution. Call if improvement with nasal congestion, postnasal drip, and asthma for  formal prescription Essential hypertension: Controlled continue hydrochlorothiazide  Return in about 3 months (around 04/11/2014).

## 2014-02-10 ENCOUNTER — Ambulatory Visit: Payer: Federal, State, Local not specified - PPO | Admitting: Family Medicine

## 2014-02-14 ENCOUNTER — Encounter: Payer: Self-pay | Admitting: Family Medicine

## 2014-02-14 ENCOUNTER — Ambulatory Visit (INDEPENDENT_AMBULATORY_CARE_PROVIDER_SITE_OTHER): Payer: Federal, State, Local not specified - PPO | Admitting: Family Medicine

## 2014-02-14 VITALS — BP 114/72 | HR 84 | Wt 139.0 lb

## 2014-02-14 DIAGNOSIS — Z309 Encounter for contraceptive management, unspecified: Secondary | ICD-10-CM

## 2014-02-14 DIAGNOSIS — J309 Allergic rhinitis, unspecified: Secondary | ICD-10-CM

## 2014-02-14 DIAGNOSIS — Z202 Contact with and (suspected) exposure to infections with a predominantly sexual mode of transmission: Secondary | ICD-10-CM

## 2014-02-14 LAB — WET PREP FOR TRICH, YEAST, CLUE
Clue Cells Wet Prep HPF POC: NONE SEEN
Trich, Wet Prep: NONE SEEN
Yeast Wet Prep HPF POC: NONE SEEN

## 2014-02-14 MED ORDER — BECLOMETHASONE DIPROPIONATE 80 MCG/ACT NA AERS: INHALATION_SPRAY | NASAL | Status: DC

## 2014-02-14 NOTE — Progress Notes (Signed)
CC: [name] is a 38 y.o. female is here for STD testing   Subjective: HPI:  Patient reports concerns that she may have been exposed to a STD with former sexual partners however denies any current specific symptoms prompting concerns. She's never had an STD before that she knows of. She denies vaginal discharge, dysuria, urethral discharge, poorly healing wounds, rashes, skin lesions, right upper quadrant pain nor jaundice.  Requesting refill on qnasl which she believes significantly improved asthma symptoms and allergic rhinitis and kept wheezing and coughing at bay. Symptoms are mildly and gradually returning since running out of this medication.  She requests nonhormonal guidance on contraceptive measures. She has tried Depo before however this caused unintentional weight plane which was intolerable. She is allergic to latex and cannot use traditional condoms   Review Of Systems Outlined In HPI  Past Medical History  Diagnosis Date  . Asthma   . GERD (gastroesophageal reflux disease)   . Hypertension     only to combat the effects of hctz    Past Surgical History  Procedure Laterality Date  . Tonsillectomy     Family History  Problem Relation Age of Onset  . Cancer Mother   . Hypertension Mother   . Diabetes Maternal Grandfather     History   Social History  . Marital Status: Single    Spouse Name: N/A    Number of Children: N/A  . Years of Education: N/A   Occupational History  . Not on file.   Social History Main Topics  . Smoking status: Never Smoker   . Smokeless tobacco: Not on file  . Alcohol Use: No  . Drug Use: No  . Sexual Activity: Not Currently    Birth Control/ Protection: None   Other Topics Concern  . Not on file   Social History Narrative  . No narrative on file     Objective: BP 114/72  Pulse 84  Wt 139 lb (63.05 kg)  LMP 02/17/2013  Vital signs reviewed. General: Alert and Oriented, No Acute Distress HEENT: Pupils equal,  round, reactive to light. Conjunctivae clear.  External ears unremarkable.  Moist mucous membranes. Lungs: Clear and comfortable work of breathing, speaking in full sentences without accessory muscle use. Cardiac: Regular rate and rhythm.  Neuro: CN II-XII grossly intact, gait normal. Extremities: No peripheral edema.  Strong peripheral pulses.  Mental Status: No depression, anxiety, nor agitation. Logical though process. Skin: Warm and dry.  Assessment & Plan: Toni Harmon was seen today for std testing.  Diagnoses and associated orders for this visit:  Possible exposure to STD - GC/chlamydia probe amp, urine - WET PREP FOR TRICH, YEAST, CLUE - HIV antibody - RPR - Hepatitis C antibody - Hepatitis B surface antigen  Unspecified contraceptive management  Allergic rhinitis  Other Orders - Beclomethasone Dipropionate (QNASL) 80 MCG/ACT AERS; 2 sprays each nostril daily    Possible STD exposure: Discussed options for testing and she ultimately decided to go with labs listed above. Contraceptive management: Discussed multiple options and ultimately feel that ParaGard would be a very effective nonhormonal intervention. I encouraged her to discuss this with her OB/GYN since I do not inserted here in our office Allergic rhinitis: Uncontrolled, formal prescription for qnasl provided  25 minutes spent face-to-face during visit today of which at least 50% was counseling or coordinating care regarding: 1. Possible exposure to STD   2. Unspecified contraceptive management   3. Allergic rhinitis       No Follow-up  on file.

## 2014-02-15 LAB — GC/CHLAMYDIA PROBE AMP, URINE
CHLAMYDIA, SWAB/URINE, PCR: NEGATIVE
GC PROBE AMP, URINE: NEGATIVE

## 2014-02-15 LAB — HEPATITIS B SURFACE ANTIGEN: Hepatitis B Surface Ag: NEGATIVE

## 2014-02-15 LAB — HEPATITIS C ANTIBODY: HCV Ab: NEGATIVE

## 2014-02-15 LAB — RPR

## 2014-02-15 LAB — HIV ANTIBODY (ROUTINE TESTING W REFLEX): HIV 1&2 Ab, 4th Generation: NONREACTIVE

## 2014-03-22 ENCOUNTER — Ambulatory Visit: Payer: Federal, State, Local not specified - PPO | Admitting: Obstetrics & Gynecology

## 2014-03-22 DIAGNOSIS — Z01419 Encounter for gynecological examination (general) (routine) without abnormal findings: Secondary | ICD-10-CM

## 2014-08-07 ENCOUNTER — Encounter: Payer: Self-pay | Admitting: Family Medicine

## 2014-11-01 ENCOUNTER — Encounter: Payer: Self-pay | Admitting: Family Medicine

## 2014-11-01 ENCOUNTER — Ambulatory Visit (INDEPENDENT_AMBULATORY_CARE_PROVIDER_SITE_OTHER): Payer: Federal, State, Local not specified - PPO | Admitting: Family Medicine

## 2014-11-01 VITALS — BP 113/73 | HR 77 | Wt 159.0 lb

## 2014-11-01 DIAGNOSIS — J4550 Severe persistent asthma, uncomplicated: Secondary | ICD-10-CM

## 2014-11-01 DIAGNOSIS — I10 Essential (primary) hypertension: Secondary | ICD-10-CM | POA: Diagnosis not present

## 2014-11-01 DIAGNOSIS — K589 Irritable bowel syndrome without diarrhea: Secondary | ICD-10-CM

## 2014-11-01 MED ORDER — BUDESONIDE 32 MCG/ACT NA SUSP: 2.0000 | Freq: Every day | NASAL | Status: DC

## 2014-11-01 MED ORDER — MONTELUKAST SODIUM 10 MG PO TABS: 10.0000 mg | ORAL_TABLET | Freq: Every day | ORAL | Status: DC

## 2014-11-01 MED ORDER — HYDROCHLOROTHIAZIDE 25 MG PO TABS: 25.0000 mg | ORAL_TABLET | Freq: Every day | ORAL | Status: DC

## 2014-11-01 MED ORDER — ALBUTEROL SULFATE HFA 108 (90 BASE) MCG/ACT IN AERS: 2.0000 | INHALATION_SPRAY | RESPIRATORY_TRACT | Status: DC | PRN

## 2014-11-01 NOTE — Progress Notes (Signed)
CC: Toni Harmon is a 39 y.o. female is here for FMLA paper filled out   Subjective: HPI:  Follow-up hypertension: Continues to take 25 mg hydrochlorothiazide on a daily basis. She checks her blood pressure a few times a week it is consistently below 140/90. No chest pain orthopnea nor peripheral edema.  Follow-up asthma: She has paperwork to be filled out for FMLA. She tells me that this winter has been much worse for her asthma than normal and over the past 3 months she has had to take on average one day a week off of work. She stopped using nasal corticosteroids because qnasl was burning her nostrils. Stopping this has coincided with worsening asthma. Describes symptoms as cough, shortness of breath and wheezing that improves with albuterol inhaler and nebulizer  Follow-up IBS: She has paperwork to be filled out for FMLA. She tells me over the last year she has had to miss 8 days due to diarrhea attributed to IBS. Symptoms are described as abdominal discomfort that is relieved by defecation. She's also had issues with constipation however this is never caused her to miss work. Episodes last only 1 day when present. She is uncertain of any certain triggers. There has been no unintentional weight loss vomiting or blood in stool   Review Of Systems Outlined In HPI  Past Medical History  Diagnosis Date  . Asthma   . GERD (gastroesophageal reflux disease)   . Hypertension     only to combat the effects of hctz    Past Surgical History  Procedure Laterality Date  . Tonsillectomy     Family History  Problem Relation Age of Onset  . Cancer Mother   . Hypertension Mother   . Diabetes Maternal Grandfather     History   Social History  . Marital Status: Single    Spouse Name: N/A    Number of Children: N/A  . Years of Education: N/A   Occupational History  . Not on file.   Social History Main Topics  . Smoking status: Never Smoker   . Smokeless tobacco: Not on file  .  Alcohol Use: No  . Drug Use: No  . Sexual Activity: Not Currently    Birth Control/ Protection: None   Other Topics Concern  . Not on file   Social History Narrative     Objective: BP 113/73 mmHg  Pulse 77  Wt 159 lb (72.122 kg)  General: Alert and Oriented, No Acute Distress HEENT: Pupils equal, round, reactive to light. Conjunctivae clear.  Moist mucous membranes pharynx unremarkable mild nasal congestion. Lungs: Clear to auscultation bilaterally, no wheezing/ronchi/rales.  Comfortable work of breathing. Good air movement. Cardiac: Regular rate and rhythm. Normal S1/S2.  No murmurs, rubs, nor gallops.   Extremities: No peripheral edema.  Strong peripheral pulses.  Mental Status: No depression, anxiety, nor agitation. Skin: Warm and dry.  Assessment & Plan: Toni Harmon was seen today for fmla paper filled out.  Diagnoses and associated orders for this visit:  Essential hypertension, benign - hydrochlorothiazide (HYDRODIURIL) 25 MG tablet; Take 1 tablet (25 mg total) by mouth daily.  Asthma, chronic, severe persistent, uncomplicated - montelukast (SINGULAIR) 10 MG tablet; Take 1 tablet (10 mg total) by mouth at bedtime. - albuterol (PROVENTIL HFA;VENTOLIN HFA) 108 (90 BASE) MCG/ACT inhaler; Inhale 2 puffs into the lungs every 4 (four) hours as needed for wheezing. Please supply Ventolin HFA - budesonide (RHINOCORT AQUA) 32 MCG/ACT nasal spray; Place 2 sprays into both nostrils daily.  IBS (  irritable bowel syndrome)    Essential hypertension: Controlled continue hydrochlorothiazide Asthma: Uncontrolled continue Singulair, Advair, as needed albuterol. Restarting nasal corticosteroid as it sounds like postnasal drip could be worsening her symptoms. Time was taken to fill out FMLA paperwork IVS: Controlled, time was taken to fill out her FMLA paperwork  25 minutes spent face-to-face during visit today of which at least 50% was counseling or coordinating care regarding: 1.  Essential hypertension, benign   2. Asthma, chronic, severe persistent, uncomplicated   3. IBS (irritable bowel syndrome)      Return in about 3 months (around 01/31/2015) for Asthma Follow Up.

## 2014-12-24 ENCOUNTER — Inpatient Hospital Stay (HOSPITAL_COMMUNITY)
Admission: AD | Admit: 2014-12-24 | Discharge: 2014-12-25 | Disposition: A | Discharge status: Home / Self Care | Payer: Federal, State, Local not specified - PPO | Source: Ambulatory Visit | Attending: Obstetrics and Gynecology | Admitting: Obstetrics and Gynecology

## 2014-12-24 DIAGNOSIS — R109 Unspecified abdominal pain: Secondary | ICD-10-CM | POA: Insufficient documentation

## 2014-12-24 DIAGNOSIS — O26899 Other specified pregnancy related conditions, unspecified trimester: Secondary | ICD-10-CM

## 2014-12-24 DIAGNOSIS — O10011 Pre-existing essential hypertension complicating pregnancy, first trimester: Secondary | ICD-10-CM | POA: Insufficient documentation

## 2014-12-24 DIAGNOSIS — Z3A01 Less than 8 weeks gestation of pregnancy: Secondary | ICD-10-CM | POA: Insufficient documentation

## 2014-12-24 DIAGNOSIS — O209 Hemorrhage in early pregnancy, unspecified: Secondary | ICD-10-CM | POA: Insufficient documentation

## 2014-12-24 DIAGNOSIS — K219 Gastro-esophageal reflux disease without esophagitis: Secondary | ICD-10-CM | POA: Insufficient documentation

## 2014-12-24 DIAGNOSIS — O99611 Diseases of the digestive system complicating pregnancy, first trimester: Secondary | ICD-10-CM | POA: Insufficient documentation

## 2014-12-24 NOTE — MAU Note (Signed)
LMP 2/1. Vaginal spotting x 2 weeks; brown with some red. Abdominal cramping x 2 weeks, getting worse.

## 2014-12-25 ENCOUNTER — Encounter (HOSPITAL_COMMUNITY): Payer: Self-pay

## 2014-12-25 ENCOUNTER — Inpatient Hospital Stay (HOSPITAL_COMMUNITY): Payer: Federal, State, Local not specified - PPO

## 2014-12-25 DIAGNOSIS — O9989 Other specified diseases and conditions complicating pregnancy, childbirth and the puerperium: Secondary | ICD-10-CM | POA: Diagnosis not present

## 2014-12-25 DIAGNOSIS — Z3A01 Less than 8 weeks gestation of pregnancy: Secondary | ICD-10-CM | POA: Diagnosis not present

## 2014-12-25 DIAGNOSIS — O10011 Pre-existing essential hypertension complicating pregnancy, first trimester: Secondary | ICD-10-CM | POA: Diagnosis not present

## 2014-12-25 DIAGNOSIS — N939 Abnormal uterine and vaginal bleeding, unspecified: Secondary | ICD-10-CM | POA: Diagnosis present

## 2014-12-25 DIAGNOSIS — O99611 Diseases of the digestive system complicating pregnancy, first trimester: Secondary | ICD-10-CM | POA: Diagnosis not present

## 2014-12-25 DIAGNOSIS — K219 Gastro-esophageal reflux disease without esophagitis: Secondary | ICD-10-CM | POA: Diagnosis not present

## 2014-12-25 DIAGNOSIS — O209 Hemorrhage in early pregnancy, unspecified: Secondary | ICD-10-CM | POA: Diagnosis not present

## 2014-12-25 DIAGNOSIS — R109 Unspecified abdominal pain: Secondary | ICD-10-CM

## 2014-12-25 LAB — GC/CHLAMYDIA PROBE AMP (~~LOC~~) NOT AT ARMC
Chlamydia: NEGATIVE
NEISSERIA GONORRHEA: NEGATIVE

## 2014-12-25 LAB — URINALYSIS, ROUTINE W REFLEX MICROSCOPIC
Bilirubin Urine: NEGATIVE
GLUCOSE, UA: NEGATIVE mg/dL
Hgb urine dipstick: NEGATIVE
Ketones, ur: NEGATIVE mg/dL
LEUKOCYTES UA: NEGATIVE
Nitrite: NEGATIVE
PH: 6 (ref 5.0–8.0)
Protein, ur: NEGATIVE mg/dL
Specific Gravity, Urine: 1.025 (ref 1.005–1.030)
Urobilinogen, UA: 0.2 mg/dL (ref 0.0–1.0)

## 2014-12-25 LAB — HIV ANTIBODY (ROUTINE TESTING W REFLEX): HIV SCREEN 4TH GENERATION: NONREACTIVE

## 2014-12-25 LAB — WET PREP, GENITAL
CLUE CELLS WET PREP: NONE SEEN
Trich, Wet Prep: NONE SEEN
WBC WET PREP: NONE SEEN
Yeast Wet Prep HPF POC: NONE SEEN

## 2014-12-25 LAB — CBC
HEMATOCRIT: 35.3 % — AB (ref 36.0–46.0)
Hemoglobin: 12.3 g/dL (ref 12.0–15.0)
MCH: 29.1 pg (ref 26.0–34.0)
MCHC: 34.8 g/dL (ref 30.0–36.0)
MCV: 83.5 fL (ref 78.0–100.0)
PLATELETS: 291 10*3/uL (ref 150–400)
RBC: 4.23 MIL/uL (ref 3.87–5.11)
RDW: 13.3 % (ref 11.5–15.5)
WBC: 7.1 10*3/uL (ref 4.0–10.5)

## 2014-12-25 LAB — HCG, QUANTITATIVE, PREGNANCY: hCG, Beta Chain, Quant, S: 224924 m[IU]/mL — ABNORMAL HIGH (ref <5)

## 2014-12-25 LAB — POCT PREGNANCY, URINE: PREG TEST UR: POSITIVE — AB

## 2014-12-25 MED ORDER — METOCLOPRAMIDE HCL 10 MG PO TABS: 10.0000 mg | ORAL_TABLET | Freq: Three times a day (TID) | ORAL | Status: DC

## 2014-12-25 NOTE — MAU Provider Note (Signed)
History     CSN: [id number]  Arrival date and time: 12/24/14 2331   None     Chief Complaint  Patient presents with  . Vaginal Bleeding  . Abdominal Cramping  . Emesis During Pregnancy   HPI  Ms. Toni Harmon is a 39 y.o. G4P0020 at unknown gestation with report of dark brown discharge x two weeks.  Pelvic cramping started today.  Patient's last menstrual period was 11/06/2014 (within days).  Denies unilateral pain.  Pain is described as sharp and rated 6/10.  Past Medical History  Diagnosis Date  . Asthma   . GERD (gastroesophageal reflux disease)   . Hypertension     only to combat the effects of hctz    Past Surgical History  Procedure Laterality Date  . Tonsillectomy      Family History  Problem Relation Age of Onset  . Cancer Mother   . Hypertension Mother   . Diabetes Maternal Grandfather     History  Substance Use Topics  . Smoking status: Never Smoker   . Smokeless tobacco: Not on file  . Alcohol Use: No    Allergies:  Allergies  Allergen Reactions  . Compazine [Prochlorperazine Edisylate]   . Latex   . Other     Tree nuts, melon, gluten intolerance, eggs  . Peanut-Containing Drug Products     Prescriptions prior to admission  Medication Sig Dispense Refill Last Dose  . albuterol (PROVENTIL HFA;VENTOLIN HFA) 108 (90 BASE) MCG/ACT inhaler Inhale 2 puffs into the lungs every 4 (four) hours as needed for wheezing. Please supply Ventolin HFA 1 Inhaler 2 Past Week at Unknown time  . albuterol (PROVENTIL) (2.5 MG/3ML) 0.083% nebulizer solution Take 2.5 mg by nebulization every 6 (six) hours as needed.   Past Month at Unknown time  . budesonide (RHINOCORT AQUA) 32 MCG/ACT nasal spray Place 2 sprays into both nostrils daily. 8.6 g 11 12/24/2014 at Unknown time  . Fluticasone-Salmeterol (ADVAIR DISKUS) 500-50 MCG/DOSE AEPB Inhale 1 puff into the lungs 2 (two) times daily. 1 each 3 12/24/2014 at Unknown time  . hydrochlorothiazide (HYDRODIURIL) 25 MG  tablet Take 1 tablet (25 mg total) by mouth daily. 90 tablet 3 12/24/2014 at Unknown time  . ibuprofen (ADVIL,MOTRIN) 600 MG tablet Take 1 tablet (600 mg total) by mouth every 6 (six) hours as needed for pain. 30 tablet 0 Past Week at Unknown time  . loratadine (CLARITIN) 10 MG tablet Take 10 mg by mouth daily.   12/24/2014 at Unknown time  . montelukast (SINGULAIR) 10 MG tablet Take 1 tablet (10 mg total) by mouth at bedtime. 90 tablet 3 12/24/2014 at Unknown time  . pseudoephedrine (SUDAFED) 30 MG tablet Take 30 mg by mouth every 4 (four) hours as needed for congestion.   Past Week at Unknown time  . AMBULATORY NON FORMULARY MEDICATION Nebulizer with necessary accessories.  Dx: Moderate Persistent Asthma 1 Units 0 Taking    Review of Systems  Constitutional: Negative for fever and chills.  Gastrointestinal: Positive for nausea and abdominal pain (lower pelvic). Negative for diarrhea and constipation.  Genitourinary: Negative for dysuria, urgency and frequency.       Dark brown discharge  All other systems reviewed and are negative.  Physical Exam   Blood pressure 123/52, pulse 67, temperature 98.7 F (37.1 C), temperature source Oral, resp. rate 16, height 5\' 6"  (1.676 m), weight 74.571 kg (164 lb 6.4 oz), last menstrual period 11/06/2014, SpO2 100 %.  Physical Exam  Constitutional: She is  oriented to person, place, and time. She appears well-developed and well-nourished. No distress.  HENT:  Head: Normocephalic.  Neck: Normal range of motion. Neck supple.  Cardiovascular: Normal rate, regular rhythm and normal heart sounds.   Respiratory: Effort normal and breath sounds normal.  GI: Soft. There is no tenderness.  Genitourinary: Uterus is enlarged (6-[redacted] wks gestation). Cervix exhibits discharge (dark red blood). No bleeding in the vagina. Vaginal discharge (thin, yellowish) found.  Neurological: She is alert and oriented to person, place, and time.  Skin: Skin is warm and dry.    MAU  Course  Procedures Results for orders placed or performed during the hospital encounter of 12/24/14 (from the past 24 hour(s))  Urinalysis, Routine w reflex microscopic     Status: None   Collection Time: 12/25/14 12:05 AM  Result Value Ref Range   Color, Urine YELLOW YELLOW   APPearance CLEAR CLEAR   Specific Gravity, Urine 1.025 1.005 - 1.030   pH 6.0 5.0 - 8.0   Glucose, UA NEGATIVE NEGATIVE mg/dL   Hgb urine dipstick NEGATIVE NEGATIVE   Bilirubin Urine NEGATIVE NEGATIVE   Ketones, ur NEGATIVE NEGATIVE mg/dL   Protein, ur NEGATIVE NEGATIVE mg/dL   Urobilinogen, UA 0.2 0.0 - 1.0 mg/dL   Nitrite NEGATIVE NEGATIVE   Leukocytes, UA NEGATIVE NEGATIVE  Pregnancy, urine POC     Status: Abnormal   Collection Time: 12/25/14 12:10 AM  Result Value Ref Range   Preg Test, Ur POSITIVE (A) NEGATIVE  CBC     Status: Abnormal   Collection Time: 12/25/14 12:25 AM  Result Value Ref Range   WBC 7.1 4.0 - 10.5 K/uL   RBC 4.23 3.87 - 5.11 MIL/uL   Hemoglobin 12.3 12.0 - 15.0 g/dL   HCT 16.1 (L) 09.6 - 04.5 %   MCV 83.5 78.0 - 100.0 fL   MCH 29.1 26.0 - 34.0 pg   MCHC 34.8 30.0 - 36.0 g/dL   RDW 40.9 81.1 - 91.4 %   Platelets 291 150 - 400 K/uL  hCG, quantitative, pregnancy     Status: Abnormal   Collection Time: 12/25/14 12:25 AM  Result Value Ref Range   hCG, Beta Chain, Quant, S [id number] (H) <5 mIU/mL  Wet prep, genital     Status: None   Collection Time: 12/25/14  1:32 AM  Result Value Ref Range   Yeast Wet Prep HPF POC NONE SEEN NONE SEEN   Trich, Wet Prep NONE SEEN NONE SEEN   Clue Cells Wet Prep HPF POC NONE SEEN NONE SEEN   WBC, Wet Prep HPF POC NONE SEEN NONE SEEN    Ultrasound:  FINDINGS: Intrauterine gestational sac: Visualized/normal in shape.  Yolk sac: Yes  Embryo: Yes  Cardiac Activity: Yes  Heart Rate: 142 bpm  CRL: 11 mm 7 w 2 d [place] EDC: 08/11/2015  Maternal uterus/adnexae: Normal Assessment and Plan  Vaginal  Bleeding/Abdominal Pain in First Trimester  Plan: Discharge to home Prenatal vitamins GC/CT pending; HIV pending Encouraged prenatal care Reviewed warning signs of pregnancy  Marlis Edelson 12/25/2014, 2:28 AM

## 2015-01-01 ENCOUNTER — Inpatient Hospital Stay (HOSPITAL_COMMUNITY)
Admission: AD | Admit: 2015-01-01 | Discharge: 2015-01-02 | Disposition: A | Discharge status: Home / Self Care | Payer: Federal, State, Local not specified - PPO | Source: Ambulatory Visit | Attending: Obstetrics & Gynecology | Admitting: Obstetrics & Gynecology

## 2015-01-01 ENCOUNTER — Encounter (HOSPITAL_COMMUNITY): Payer: Self-pay | Admitting: *Deleted

## 2015-01-01 DIAGNOSIS — G4489 Other headache syndrome: Secondary | ICD-10-CM | POA: Diagnosis not present

## 2015-01-01 DIAGNOSIS — M545 Low back pain: Secondary | ICD-10-CM | POA: Diagnosis not present

## 2015-01-01 DIAGNOSIS — O219 Vomiting of pregnancy, unspecified: Secondary | ICD-10-CM | POA: Diagnosis not present

## 2015-01-01 DIAGNOSIS — J45909 Unspecified asthma, uncomplicated: Secondary | ICD-10-CM | POA: Diagnosis not present

## 2015-01-01 DIAGNOSIS — Z7951 Long term (current) use of inhaled steroids: Secondary | ICD-10-CM | POA: Insufficient documentation

## 2015-01-01 DIAGNOSIS — R1110 Vomiting, unspecified: Secondary | ICD-10-CM | POA: Diagnosis present

## 2015-01-01 DIAGNOSIS — Z3A08 8 weeks gestation of pregnancy: Secondary | ICD-10-CM | POA: Insufficient documentation

## 2015-01-01 DIAGNOSIS — O99511 Diseases of the respiratory system complicating pregnancy, first trimester: Secondary | ICD-10-CM | POA: Diagnosis not present

## 2015-01-01 DIAGNOSIS — Z91040 Latex allergy status: Secondary | ICD-10-CM | POA: Insufficient documentation

## 2015-01-01 DIAGNOSIS — O9989 Other specified diseases and conditions complicating pregnancy, childbirth and the puerperium: Secondary | ICD-10-CM | POA: Insufficient documentation

## 2015-01-01 LAB — BASIC METABOLIC PANEL
Anion gap: 8 (ref 5–15)
BUN: 10 mg/dL (ref 6–23)
CHLORIDE: 100 mmol/L (ref 96–112)
CO2: 28 mmol/L (ref 19–32)
CREATININE: 0.48 mg/dL — AB (ref 0.50–1.10)
Calcium: 9.8 mg/dL (ref 8.4–10.5)
GFR calc non Af Amer: >90 mL/min (ref >90)
Glucose, Bld: 79 mg/dL (ref 70–99)
POTASSIUM: 4.2 mmol/L (ref 3.5–5.1)
Sodium: 136 mmol/L (ref 135–145)

## 2015-01-01 LAB — URINALYSIS, ROUTINE W REFLEX MICROSCOPIC
Bilirubin Urine: NEGATIVE
Glucose, UA: NEGATIVE mg/dL
HGB URINE DIPSTICK: NEGATIVE
KETONES UR: NEGATIVE mg/dL
LEUKOCYTES UA: NEGATIVE
Nitrite: NEGATIVE
PROTEIN: NEGATIVE mg/dL
Specific Gravity, Urine: >1.03 — ABNORMAL HIGH (ref 1.005–1.030)
Urobilinogen, UA: 0.2 mg/dL (ref 0.0–1.0)
pH: 6 (ref 5.0–8.0)

## 2015-01-01 LAB — CBC
HEMATOCRIT: 35.5 % — AB (ref 36.0–46.0)
Hemoglobin: 12.1 g/dL (ref 12.0–15.0)
MCH: 28.8 pg (ref 26.0–34.0)
MCHC: 34.1 g/dL (ref 30.0–36.0)
MCV: 84.5 fL (ref 78.0–100.0)
Platelets: 308 10*3/uL (ref 150–400)
RBC: 4.2 MIL/uL (ref 3.87–5.11)
RDW: 14 % (ref 11.5–15.5)
WBC: 8.8 10*3/uL (ref 4.0–10.5)

## 2015-01-01 MED ORDER — ACETAMINOPHEN 325 MG PO TABS
650.0000 mg | ORAL_TABLET | Freq: Once | ORAL | Status: AC
  Administered 2015-01-01: 650 mg via ORAL
  Filled 2015-01-01: qty 2

## 2015-01-01 MED ORDER — LACTATED RINGERS IV BOLUS (SEPSIS)
1000.0000 mL | Freq: Once | INTRAVENOUS | Status: AC
  Administered 2015-01-01: 1000 mL via INTRAVENOUS

## 2015-01-01 MED ORDER — ONDANSETRON 8 MG PO TBDP: 8.0000 mg | ORAL_TABLET | Freq: Two times a day (BID) | ORAL | Status: DC

## 2015-01-01 NOTE — MAU Note (Signed)
Pt reports she began having lower back pain 4 days ago, states the reglan is not working because she vomited all day yesterday, and vomited x 2 today. States she has been having headaches due to the reglan.

## 2015-01-01 NOTE — MAU Provider Note (Signed)
CSN: [id number]     Arrival date & time 01/01/15  2030 History   None    No chief complaint on file.    (Consider location/radiation/quality/duration/timing/severity/associated sxs/prior Treatment) Patient is a 39 y.o. female presenting with vomiting. The history is provided by the patient. No language interpreter was used.  Emesis  This is a new problem. The current episode started more than 1 week ago. The problem occurs 2 to 4 times per day. The problem has not changed since onset.There has been no fever. Associated symptoms include headaches. Pertinent negatives include no abdominal pain, no arthralgias, no chills, no cough, no diarrhea, no fever and no URI.   Toni Harmon is a 39 y.o. G4P0030 @ [redacted]w[redacted]d gestation who presents to the ED with n/v , low back pain and headache that she thinks is due to the Reglan. She was evaluated here one week ago and treated with the Reglan. She has been vomiting 4 to 5 times a day. The Reglan has not helped only makes her head hurt. The headache is mostly frontal and is worse with vomiting. She has kept a small amount of ginger ale down. She reports having felt a little dizzy at times. Patient does not want phenergan while here in the MAU tonight because it makes her to sleepy. She does not want to take the Diclegis because she has asthma and read that it may cause her asthma to get bad. She does not want Zofran because of the possibility of congenital defects.  Patient has not taken anything for pain.  Past Medical History  Diagnosis Date  . Asthma   . GERD (gastroesophageal reflux disease)   . Hypertension     only to combat the effects of hctz   Past Surgical History  Procedure Laterality Date  . Tonsillectomy    . Tonsillectomy     Family History  Problem Relation Age of Onset  . Cancer Mother   . Hypertension Mother   . Diabetes Maternal Grandfather    History  Substance Use Topics  . Smoking status: Never Smoker   . Smokeless tobacco:  Never Used  . Alcohol Use: No   OB History    Gravida Para Term Preterm AB TAB SAB Ectopic Multiple Living   0     Review of Systems  Constitutional: Negative for fever and chills.  HENT: Positive for congestion and sinus pressure. Negative for ear pain and trouble swallowing.   Eyes: Negative for photophobia, pain and visual disturbance.  Respiratory: Negative for cough and chest tightness.   Cardiovascular: Negative for chest pain and leg swelling.  Gastrointestinal: Positive for nausea and vomiting. Negative for abdominal pain and diarrhea.  Genitourinary: Negative for dysuria, urgency, frequency, flank pain, decreased urine volume, vaginal bleeding, vaginal discharge and pelvic pain.  Musculoskeletal: Negative for arthralgias.  Skin: Negative for rash.  Neurological: Positive for headaches. Negative for speech difficulty.  Psychiatric/Behavioral: The patient is not nervous/anxious.       Allergies  Compazine; Latex; Other; and Peanut-containing drug products  Home Medications   Prior to Admission medications   Medication Sig Start Date End Date Taking? Authorizing Provider  albuterol (PROVENTIL HFA;VENTOLIN HFA) 108 (90 BASE) MCG/ACT inhaler Inhale 2 puffs into the lungs every 4 (four) hours as needed for wheezing. Please supply Ventolin [facility] 11/01/14 11/01/15 Yes [name], DO  budesonide (RHINOCORT AQUA) 32 MCG/ACT nasal spray Place 2 sprays into both nostrils daily. 11/01/14  Yes [name], DO  Fluticasone-Salmeterol (ADVAIR DISKUS) 500-50 MCG/DOSE AEPB Inhale 1 puff into the lungs 2 (two) times daily. 10/26/13  Yes [name], DO  HYDROCHLOROTHIAZIDE PO Take 1 tablet by mouth daily.   Yes Historical Provider, MD  loratadine (CLARITIN) 10 MG tablet Take 10 mg by mouth daily.   Yes Historical Provider, MD  montelukast (SINGULAIR) 10 MG tablet Take 1 tablet (10 mg total) by mouth at bedtime. 11/01/14  Yes [name], DO  albuterol (PROVENTIL) (2.5 MG/3ML) 0.083%  nebulizer solution Take 2.5 mg by nebulization every 6 (six) hours as needed for wheezing or shortness of breath.     Historical Provider, MD  AMBULATORY NON FORMULARY MEDICATION Nebulizer with necessary accessories.  Dx: Moderate Persistent Asthma 10/26/13   Laren BoomSean Hommel, DO  promethazine (PHENERGAN) 25 MG tablet Take 0.5 tablets (12.5 mg total) by mouth every 6 (six) hours as needed. 01/02/15   Hope Orlene OchM Neese, NP   BP 102/66 mmHg  Pulse 62  Resp 18  SpO2 100%  LMP 11/06/2014 (Within Days) Physical Exam  Constitutional: She is oriented to person, place, and time. She appears well-developed and well-nourished. No distress.  HENT:  Head: Normocephalic and atraumatic.  Right Ear: Tympanic membrane normal.  Left Ear: Tympanic membrane normal.  Nose: Nose normal.  Mouth/Throat: Uvula is midline, oropharynx is clear and moist and mucous membranes are normal.  Eyes: Conjunctivae and EOM are normal. Pupils are equal, round, and reactive to light.  Neck: Normal range of motion. Neck supple.  Cardiovascular: Normal rate and regular rhythm.   Pulmonary/Chest: Effort normal. She has no wheezes. She has no rales.  Abdominal: Soft. Bowel sounds are normal. She exhibits no mass. There is no tenderness.  Musculoskeletal: Normal range of motion. She exhibits no edema.  Radial and pedal pulses strong, adequate circulation, good touch sensation.  Neurological: She is alert and oriented to person, place, and time. She has normal strength. No cranial nerve deficit or sensory deficit. She displays a negative Romberg sign. Gait normal.  Reflex Scores:      Bicep reflexes are 2+ on the right side and 2+ on the left side.      Brachioradialis reflexes are 2+ on the right side and 2+ on the left side.      Patellar reflexes are 2+ on the right side and 2+ on the left side.      Achilles reflexes are 2+ on the right side and 2+ on the left side. Rapid alternating movement without difficulty. Stands on one foot  without difficulty.  Skin: Skin is warm and dry.  Psychiatric: She has a normal mood and affect. Her behavior is normal.  Nursing note and vitals reviewed.   ED Course  Procedures (including critical care time) Labs, tylenol, exam  Labs Review Results for orders placed or performed during the hospital encounter of 01/01/15 (from the past 24 hour(s))  Urinalysis, Routine w reflex microscopic     Status: Abnormal   Collection Time: 01/01/15  9:01 PM  Result Value Ref Range   Color, Urine YELLOW YELLOW   APPearance HAZY (A) CLEAR   Specific Gravity, Urine >1.030 (H) 1.005 - 1.030   pH 6.0 5.0 - 8.0   Glucose, UA NEGATIVE NEGATIVE mg/dL   Hgb urine dipstick NEGATIVE NEGATIVE   Bilirubin Urine NEGATIVE NEGATIVE   Ketones, ur NEGATIVE NEGATIVE mg/dL   Protein, ur NEGATIVE NEGATIVE mg/dL   Urobilinogen, UA 0.2 0.0 - 1.0 mg/dL   Nitrite NEGATIVE NEGATIVE  Leukocytes, UA NEGATIVE NEGATIVE  Basic metabolic panel     Status: Abnormal   Collection Time: 01/01/15 10:55 PM  Result Value Ref Range   Sodium 136 135 - 145 mmol/L   Potassium 4.2 3.5 - 5.1 mmol/L   Chloride 100 96 - 112 mmol/L   CO2 28 19 - 32 mmol/L   Glucose, Bld 79 70 - 99 mg/dL   BUN 10 6 - 23 mg/dL   Creatinine, Ser 1.61 (L) 0.50 - 1.10 mg/dL   Calcium 9.8 8.4 - 09.6 mg/dL   GFR calc non Af Amer >90 >90 mL/min   GFR calc Af Amer >90 >90 mL/min   Anion gap 8 5 - 15  CBC     Status: Abnormal   Collection Time: 01/01/15 10:55 PM  Result Value Ref Range   WBC 8.8 4.0 - 10.5 K/uL   RBC 4.20 3.87 - 5.11 MIL/uL   Hemoglobin 12.1 12.0 - 15.0 g/dL   HCT 04.5 (L) 40.9 - 81.1 %   MCV 84.5 78.0 - 100.0 fL   MCH 28.8 26.0 - 34.0 pg   MCHC 34.1 30.0 - 36.0 g/dL   RDW 91.4 78.2 - 95.6 %   Platelets 308 150 - 400 K/uL    Headache improved with tylenol and IV fluids. Will give patient Rx for Phenergan 12.5 mg. Discussed may half the does it it makes her sleepy.  MDM  39 y.o. female with nausea and vomiting in first  trimester pregnancy and headache.  Stable for d/c without focal neuro deficits. No red flags to indicate immediate neuro consult. I have reviewed this patient's vital signs, nurses notes and appropriate labs. I have discussed findings and plan of care with the patient. She voices understanding and agrees with plan. She will follow up for her prenatal care and will return here as needed.   Final diagnoses:  Nausea and vomiting in pregnancy  Other headache syndrome

## 2015-01-02 MED ORDER — PROMETHAZINE HCL 25 MG PO TABS: 12.5000 mg | ORAL_TABLET | Freq: Four times a day (QID) | ORAL | Status: DC | PRN

## 2015-01-02 NOTE — Discharge Instructions (Signed)
I am prescribing a small dose of phenergan that you may try for nausea. If it makes you sleepy you can half the dose. Follow up with your doctor. Return here as needed for worsening symptoms.

## 2015-01-04 ENCOUNTER — Encounter: Payer: Self-pay | Admitting: Family Medicine

## 2015-01-04 ENCOUNTER — Ambulatory Visit (INDEPENDENT_AMBULATORY_CARE_PROVIDER_SITE_OTHER): Payer: Federal, State, Local not specified - PPO | Admitting: Family Medicine

## 2015-01-04 VITALS — BP 111/70 | HR 68 | Wt 172.0 lb

## 2015-01-04 DIAGNOSIS — R112 Nausea with vomiting, unspecified: Secondary | ICD-10-CM

## 2015-01-04 NOTE — Progress Notes (Signed)
CC: Toni Harmon is a 39 y.o. female is here for Hospitalization Follow-up   Subjective: HPI:  Reports nausea that has been present for the past 2 weeks. Described as sudden and severe. She is able to keep it to a mild degree with taking half of a Unisom and an unknown dose of vitamin B6 3 times a day. She's also been able to help the symptoms with small frequent sips of Gatorade. Ginger ale has been of no help, no benefit from Dramamine. Has tried Phenergan however causes intolerable sedation. Try Reglan however no benefit for nausea and caused intolerable sedation and weight gain. She's been to an emergency room twice for this condition and she knows that when she received fluids earlier this week it helped dramatically for the severe exacerbation of her nausea. Symptoms seem to be worse during sudden fluctuations of temperature. She denies fevers, chills, abdominal pain, nor any genitourinary complaints. She recently found out she was pregnant and has her initial OB appointment at the end of April.     Review Of Systems Outlined In HPI  Past Medical History  Diagnosis Date  . Asthma   . GERD (gastroesophageal reflux disease)   . Hypertension     only to combat the effects of hctz    Past Surgical History  Procedure Laterality Date  . Tonsillectomy    . Tonsillectomy     Family History  Problem Relation Age of Onset  . Cancer Mother   . Hypertension Mother   . Diabetes Maternal Grandfather     History   Social History  . Marital Status: Single    Spouse Name: N/A  . Number of Children: N/A  . Years of Education: N/A   Occupational History  . Not on file.   Social History Main Topics  . Smoking status: Never Smoker   . Smokeless tobacco: Never Used  . Alcohol Use: No  . Drug Use: No  . Sexual Activity: Yes    Birth Control/ Protection: None   Other Topics Concern  . Not on file   Social History Narrative     Objective: BP 111/70 mmHg  Pulse 68  Wt 172  lb (78.019 kg)  SpO2 100%  LMP 11/06/2014 (Within Days)  Vital signs reviewed. General: Alert and Oriented, No Acute Distress HEENT: Pupils equal, round, reactive to light. Conjunctivae clear.  External ears unremarkable.  Moist mucous membranes. Lungs: Clear and comfortable work of breathing, speaking in full sentences without accessory muscle use. Cardiac: Regular rate and rhythm.  Neuro: CN II-XII grossly intact, gait normal. Extremities: No peripheral edema.  Strong peripheral pulses.  Mental Status: No depression, anxiety, nor agitation. Logical though process. Skin: Warm and dry.  Assessment & Plan: Toni Harmon was seen today for hospitalization follow-up.  Diagnoses and all orders for this visit:  Non-intractable vomiting with nausea, vomiting of unspecified type   Encouraged her to consider increasing Unisom to a full tablet every 8 hours if nausea begins to worsen. I also encouraged her to consider establishing with our women's health clinic here in the office, she is primarily looking an appointment closer to now than late April.  Discussed the possibility of meclizine however no first trimester studies, she understandably wants to avoid Zofran given history of birth defects. I've let her know that if she feels like things are getting out of control from a nausea perspective I be happy to add her to my schedule even if I'm already booked so that she can  receive fluids here in the clinic during normal business hours.  Return if symptoms worsen or fail to improve.

## 2015-01-18 ENCOUNTER — Encounter: Payer: Federal, State, Local not specified - PPO | Admitting: Family

## 2015-01-22 ENCOUNTER — Encounter: Payer: Federal, State, Local not specified - PPO | Admitting: Obstetrics & Gynecology

## 2015-01-22 DIAGNOSIS — Z3490 Encounter for supervision of normal pregnancy, unspecified, unspecified trimester: Secondary | ICD-10-CM

## 2015-01-25 ENCOUNTER — Encounter: Payer: Federal, State, Local not specified - PPO | Admitting: Obstetrics and Gynecology

## 2015-01-31 ENCOUNTER — Ambulatory Visit: Payer: Federal, State, Local not specified - PPO | Admitting: Family Medicine

## 2015-02-05 ENCOUNTER — Ambulatory Visit (INDEPENDENT_AMBULATORY_CARE_PROVIDER_SITE_OTHER): Payer: Federal, State, Local not specified - PPO | Admitting: Family Medicine

## 2015-02-05 ENCOUNTER — Encounter: Payer: Self-pay | Admitting: Family Medicine

## 2015-02-05 VITALS — BP 103/72 | HR 79 | Wt 173.0 lb

## 2015-02-05 DIAGNOSIS — I10 Essential (primary) hypertension: Secondary | ICD-10-CM | POA: Diagnosis not present

## 2015-02-05 DIAGNOSIS — O039 Complete or unspecified spontaneous abortion without complication: Secondary | ICD-10-CM

## 2015-02-05 DIAGNOSIS — J4550 Severe persistent asthma, uncomplicated: Secondary | ICD-10-CM | POA: Diagnosis not present

## 2015-02-05 MED ORDER — EPINEPHRINE 0.3 MG/0.3ML IJ SOAJ: 0.3000 mg | Freq: Once | INTRAMUSCULAR | Status: DC

## 2015-02-05 MED ORDER — FLUTICASONE FUROATE-VILANTEROL 200-25 MCG/INH IN AEPB: 1.0000 | INHALATION_SPRAY | Freq: Every day | RESPIRATORY_TRACT | Status: DC

## 2015-02-05 NOTE — Progress Notes (Signed)
CC: Toni Harmon is a 39 y.o. female is here for Follow-up   Subjective: HPI:  Follow-up asthma: She's been using Singulair, Claritin, Advair 500-50 on a daily basis and despite this she still has been experiencing wheezing and the need to use albuterol on a daily basis. She's also noticed that her allergies are worse and wonders if this is a trigger for her recent need for albuterol. Pulmonary symptoms are resolved with using albuterol however only temporarily. She's been bothered by itchy eyes, great and her eyelashes in the morning and nasal congestion. Denies chest pain or productive cough. No fevers, chills nor swollen lymph nodes. Symptoms were present on a daily basis to a moderate degree for at least the last 2 weeks.  Follow-up essential hypertension: Currently on no antihypertensives. Focusing on sodium restriction as the only active intervention. No chest pain nor motor or sensory disturbances.  She tells me that since I saw her last she had a miscarriage in the middle of April. She still having some occasional spotting, she wants know we can check hormone levels.      Review Of Systems Outlined In HPI  Past Medical History  Diagnosis Date  . Asthma   . GERD (gastroesophageal reflux disease)   . Hypertension     only to combat the effects of hctz    Past Surgical History  Procedure Laterality Date  . Tonsillectomy    . Tonsillectomy     Family History  Problem Relation Age of Onset  . Cancer Mother   . Hypertension Mother   . Diabetes Maternal Grandfather     History   Social History  . Marital Status: Single    Spouse Name: N/A  . Number of Children: N/A  . Years of Education: N/A   Occupational History  . Not on file.   Social History Main Topics  . Smoking status: Never Smoker   . Smokeless tobacco: Never Used  . Alcohol Use: No  . Drug Use: No  . Sexual Activity: Yes    Birth Control/ Protection: None   Other Topics Concern  . Not on file    Social History Narrative     Objective: BP 103/72 mmHg  Pulse 79  Wt 173 lb (78.472 kg)  LMP 11/06/2014 (Within Days)  General: Alert and Oriented, No Acute Distress HEENT: Pupils equal, round, reactive to light. Conjunctivae clear.  Moist pedis membranes pharynx unremarkable Lungs: Clear to auscultation bilaterally, no wheezing/ronchi/rales.  Comfortable work of breathing. Good air movement. Cardiac: Regular rate and rhythm. Normal S1/S2.  No murmurs, rubs, nor gallops.   Extremities: No peripheral edema.  Strong peripheral pulses.  Mental Status: No depression, anxiety, nor agitation. Skin: Warm and dry.  Assessment & Plan: Toni Harmon was seen today for follow-up.  Diagnoses and all orders for this visit:  Miscarriage Orders: -     B-HCG Quant  Essential hypertension, benign  Asthma, chronic, severe persistent, uncomplicated  Other orders -     Fluticasone Furoate-Vilanterol (BREO ELLIPTA) 200-25 MCG/INH AEPB; Inhale 1 puff into the lungs daily. -     EPINEPHrine 0.3 mg/0.3 mL IJ SOAJ injection; Inject 0.3 mLs (0.3 mg total) into the muscle once.   Miscarriage: Given persistent spotting will check beta hCG today and if not 0 repeat in 2-3 days to assess trend. Essential hypertension: Controlled with diet and exercise interventions Asthma: Uncontrolled chronic condition switching from Advair to high dose breo.  Return in about 3 months (around 05/08/2015), or if symptoms  worsen or fail to improve.

## 2015-02-06 LAB — HCG, QUANTITATIVE, PREGNANCY: HCG, BETA CHAIN, QUANT, S: 394.6 m[IU]/mL

## 2015-02-08 ENCOUNTER — Telehealth: Payer: Self-pay | Admitting: *Deleted

## 2015-02-08 DIAGNOSIS — O039 Complete or unspecified spontaneous abortion without complication: Secondary | ICD-10-CM

## 2015-02-22 NOTE — Telephone Encounter (Signed)
closed

## 2015-03-19 ENCOUNTER — Encounter: Payer: Self-pay | Admitting: Obstetrics & Gynecology

## 2015-03-19 ENCOUNTER — Ambulatory Visit (INDEPENDENT_AMBULATORY_CARE_PROVIDER_SITE_OTHER): Payer: Federal, State, Local not specified - PPO | Admitting: Obstetrics & Gynecology

## 2015-03-19 VITALS — Ht 66.0 in | Wt 165.0 lb

## 2015-03-19 DIAGNOSIS — Z1151 Encounter for screening for human papillomavirus (HPV): Secondary | ICD-10-CM

## 2015-03-19 DIAGNOSIS — Z0000 Encounter for general adult medical examination without abnormal findings: Secondary | ICD-10-CM

## 2015-03-19 DIAGNOSIS — N938 Other specified abnormal uterine and vaginal bleeding: Secondary | ICD-10-CM | POA: Diagnosis not present

## 2015-03-19 DIAGNOSIS — Z113 Encounter for screening for infections with a predominantly sexual mode of transmission: Secondary | ICD-10-CM | POA: Diagnosis not present

## 2015-03-19 DIAGNOSIS — Z124 Encounter for screening for malignant neoplasm of cervix: Secondary | ICD-10-CM

## 2015-03-19 NOTE — Progress Notes (Signed)
   Subjective:    Patient ID: Toni Harmon, female    DOB: 1975-10-22, 39 y.o.   MRN: [id number]  HPI 39 yo M P0 here today with the issue of irregular periods/spotting after a miscarriage in 4/16. This was managed without medical treatment. She had 1 TAB and then 3 miscarriages. She is interested in having a baby.    Review of Systems Monogamous for about a year. Works at the [employer] in [place]. She is currently using family planning for contraception.    Objective:   Physical Exam  WNWHBFNAD Abd- benign EG, vagina, cervix all normal Bimanual exam- NSSA, NT, mobile, normal adnexal exam      Assessment & Plan:  Irregular bleeding since miscarriage Check cervical culture with pap, order gyn u/s, TSH Dr. Lyndal Rainbow number given to her

## 2015-03-20 ENCOUNTER — Ambulatory Visit (INDEPENDENT_AMBULATORY_CARE_PROVIDER_SITE_OTHER): Payer: Federal, State, Local not specified - PPO

## 2015-03-20 ENCOUNTER — Other Ambulatory Visit: Payer: Federal, State, Local not specified - PPO

## 2015-03-20 DIAGNOSIS — N938 Other specified abnormal uterine and vaginal bleeding: Secondary | ICD-10-CM

## 2015-03-20 LAB — HEPATITIS C ANTIBODY: HCV Ab: NEGATIVE

## 2015-03-20 LAB — TSH: TSH: 2.124 u[IU]/mL (ref 0.350–4.500)

## 2015-03-20 LAB — HIV ANTIBODY (ROUTINE TESTING W REFLEX): HIV: NONREACTIVE

## 2015-03-20 LAB — HEPATITIS B SURFACE ANTIGEN: Hepatitis B Surface Ag: NEGATIVE

## 2015-03-20 LAB — RPR

## 2015-03-21 ENCOUNTER — Telehealth: Payer: Self-pay | Admitting: *Deleted

## 2015-03-21 LAB — CYTOLOGY - PAP

## 2015-03-21 NOTE — Telephone Encounter (Signed)
-----   Message from Allie Bossier, MD sent at 03/21/2015 10:02 AM EDT ----- She needs to come in soon to have a QBHCG drawn. Thanks

## 2015-03-21 NOTE — Telephone Encounter (Signed)
Called pt to adv needs HCG completed since US shows products of conception remaining. Set appt for pt to come in Thursday.

## 2015-03-22 ENCOUNTER — Other Ambulatory Visit (INDEPENDENT_AMBULATORY_CARE_PROVIDER_SITE_OTHER): Payer: Federal, State, Local not specified - PPO | Admitting: *Deleted

## 2015-03-22 ENCOUNTER — Encounter: Payer: Self-pay | Admitting: Obstetrics & Gynecology

## 2015-03-22 DIAGNOSIS — N926 Irregular menstruation, unspecified: Secondary | ICD-10-CM

## 2015-03-23 LAB — HCG, QUANTITATIVE, PREGNANCY: hCG, Beta Chain, Quant, S: 5.6 m[IU]/mL

## 2015-03-29 ENCOUNTER — Telehealth: Payer: Self-pay | Admitting: *Deleted

## 2015-03-29 NOTE — Telephone Encounter (Signed)
LM on voicemail of BHCG results.  She is to call office to schedule a repeat in 3 weeks.

## 2015-03-29 NOTE — Telephone Encounter (Signed)
Erroneous

## 2015-03-29 NOTE — Telephone Encounter (Signed)
-----   Message from Allie Bossier, MD sent at 03/28/2015  9:05 AM EDT ----- She will need another [facility]. Thanks

## 2015-04-24 ENCOUNTER — Other Ambulatory Visit: Payer: Federal, State, Local not specified - PPO

## 2015-04-24 DIAGNOSIS — O039 Complete or unspecified spontaneous abortion without complication: Secondary | ICD-10-CM

## 2015-04-25 ENCOUNTER — Telehealth: Payer: Self-pay | Admitting: *Deleted

## 2015-04-25 LAB — HCG, QUANTITATIVE, PREGNANCY

## 2015-04-25 NOTE — Telephone Encounter (Signed)
LM on voicemail of neg BHCG results

## 2015-05-09 ENCOUNTER — Encounter: Payer: Self-pay | Admitting: Family Medicine

## 2015-05-09 ENCOUNTER — Ambulatory Visit (INDEPENDENT_AMBULATORY_CARE_PROVIDER_SITE_OTHER): Payer: Federal, State, Local not specified - PPO | Admitting: Family Medicine

## 2015-05-09 VITALS — BP 112/71 | HR 65 | Wt 160.0 lb

## 2015-05-09 DIAGNOSIS — J4550 Severe persistent asthma, uncomplicated: Secondary | ICD-10-CM

## 2015-05-09 DIAGNOSIS — I10 Essential (primary) hypertension: Secondary | ICD-10-CM | POA: Diagnosis not present

## 2015-05-09 MED ORDER — FLUTICASONE-SALMETEROL 500-50 MCG/DOSE IN AEPB: 1.0000 | INHALATION_SPRAY | Freq: Two times a day (BID) | RESPIRATORY_TRACT | Status: DC

## 2015-05-09 MED ORDER — PREDNISONE 20 MG PO TABS: ORAL_TABLET | ORAL | Status: DC

## 2015-05-09 NOTE — Progress Notes (Signed)
CC: Toni Harmon is a 39 y.o. female is here for f/u asthma   Subjective: HPI:  Follow essential hypertension: Continues on hydrochlorothiazide daily basis. No outside blood pressures report. No chest pain motor or sensory disturbances or irregular heartbeat.  Follow-up asthma: Over the past 1 or 2 weeks she's had nocturnal symptoms most nights of the week. She'll wake up in the middle the night coughing which leads to wheezing. It slowly improves after taking a albuterol nebulizer. Symptoms are also worse and humid environments. She's had some improvement with overall symptoms after getting a dehumidifier for the house. Never got Breo, taking weeks for her pharmacy to process the symptoms she went back to Advair. She is also using Singulair daily along with loratadine. Have a use albuterol most days a week   Review Of Systems Outlined In HPI  Past Medical History  Diagnosis Date  . Asthma   . GERD (gastroesophageal reflux disease)   . Hypertension     only to combat the effects of hctz    Past Surgical History  Procedure Laterality Date  . Tonsillectomy  2003  . Dilation and curettage of uterus  12/2011   Family History  Problem Relation Age of Onset  . Cervical cancer Mother 35  . Hypertension Mother   . Diabetes Maternal Grandfather     History   Social History  . Marital Status: Single    Spouse Name: N/A  . Number of Children: N/A  . Years of Education: N/A   Occupational History  . Not on file.   Social History Main Topics  . Smoking status: Never Smoker   . Smokeless tobacco: Never Used  . Alcohol Use: No  . Drug Use: No  . Sexual Activity:    Partners: Male    Birth Control/ Protection: None   Other Topics Concern  . Not on file   Social History Narrative     Objective: BP 112/71 mmHg  Pulse 65  Wt 160 lb (72.576 kg)  SpO2 100%  General: Alert and Oriented, No Acute Distress HEENT: Pupils equal, round, reactive to light. Conjunctivae clear.   Moist mucous membranes times unremarkable Lungs: Clear to auscultation bilaterally, no wheezing/ronchi/rales.  Comfortable work of breathing. Good air movement. Cardiac: Regular rate and rhythm. Normal S1/S2.  No murmurs, rubs, nor gallops.   Extremities: No peripheral edema.  Strong peripheral pulses.  Mental Status: No depression, anxiety, nor agitation. Skin: Warm and dry.  Assessment & Plan: Toni Harmon was seen today for f/u asthma.  Diagnoses and all orders for this visit:  Asthma, chronic, severe persistent, uncomplicated Orders: -     Fluticasone-Salmeterol (ADVAIR DISKUS) 500-50 MCG/DOSE AEPB; Inhale 1 puff into the lungs 2 (two) times daily. -     predniSONE (DELTASONE) 20 MG tablet; Three tabs daily days 1-3, two tabs daily days 4-6, one tab daily days 7-9, half tab daily days 10-13.  Essential hypertension, benign   Asthma: Chronic uncontrolled condition with nocturnal symptoms therefore start prednisone taper. I've offered her Xolair however she is fearful of the side effects and this seems to bother her more than her current symptom state . Continue Advair and singular Essential hypertension: Controlled continue hydrocortisone Dyazide.  Return in about 3 months (around 08/09/2015).

## 2015-08-08 ENCOUNTER — Ambulatory Visit (INDEPENDENT_AMBULATORY_CARE_PROVIDER_SITE_OTHER): Payer: Federal, State, Local not specified - PPO | Admitting: Family Medicine

## 2015-08-08 ENCOUNTER — Encounter: Payer: Self-pay | Admitting: Family Medicine

## 2015-08-08 VITALS — BP 106/68 | HR 77 | Wt 145.0 lb

## 2015-08-08 DIAGNOSIS — I10 Essential (primary) hypertension: Secondary | ICD-10-CM | POA: Diagnosis not present

## 2015-08-08 DIAGNOSIS — R252 Cramp and spasm: Secondary | ICD-10-CM

## 2015-08-08 DIAGNOSIS — J4550 Severe persistent asthma, uncomplicated: Secondary | ICD-10-CM | POA: Diagnosis not present

## 2015-08-08 MED ORDER — CETIRIZINE HCL 5 MG/5ML PO SYRP: 5.0000 mg | ORAL_SOLUTION | Freq: Every day | ORAL | Status: DC

## 2015-08-08 NOTE — Progress Notes (Signed)
CC: Toni Harmon is a 39 y.o. female is here for Asthma and Hypotension   Subjective: HPI:  Follow-up asthma: Taking Claritin, only 5 mg of Zyrtec at night because it makes her tired, Advair Diskus, and albuterol as prescribed. She is having to use albuterol inhaler 1-2 times a day almost always at work and rarely at home. She does admit that since I saw her last she had 3 episodes where she woke up in the middle of the night feeling like she couldn't breathe properly. Symptoms resolved quickly after taking 2 puffs of albuterol. Symptoms seem to be worse around dusty environments at work or when around pollen. Nothing else seems to make symptoms better or worse. There's been no fevers, chills, and cough is never productive.  Follow essential hypertension: She is taking hydrochlorothiazide as directed. Blood pressures have been in normotensive range when she checks it numerous times every week. Denies chest pain orthopnea nor peripheral edema  Since starting the ketogenic diet she's noticed that she gets cramps in her feet and also in the calves especially at night when she is trying to fall asleep. She's been taking a salt substitute that has potassium in it and symptoms have slightly improved. She denies any other motor or sensory disturbances.   Review Of Systems Outlined In HPI  Past Medical History  Diagnosis Date  . Asthma   . GERD (gastroesophageal reflux disease)   . Hypertension     only to combat the effects of hctz    Past Surgical History  Procedure Laterality Date  . Tonsillectomy  2003  . Dilation and curettage of uterus  12/2011   Family History  Problem Relation Age of Onset  . Cervical cancer Mother 9  . Hypertension Mother   . Diabetes Maternal Grandfather     Social History   Social History  . Marital Status: Single    Spouse Name: N/A  . Number of Children: N/A  . Years of Education: N/A   Occupational History  . Not on file.   Social History Main  Topics  . Smoking status: Never Smoker   . Smokeless tobacco: Never Used  . Alcohol Use: No  . Drug Use: No  . Sexual Activity:    Partners: Male    Birth Control/ Protection: None   Other Topics Concern  . Not on file   Social History Narrative     Objective: BP 106/68 mmHg  Pulse 77  Wt 145 lb (65.772 kg)  General: Alert and Oriented, No Acute Distress HEENT: Pupils equal, round, reactive to light. Conjunctivae clear.  Moist mucous membranes per unremarkable. Lungs: Clear to auscultation bilaterally, no wheezing/ronchi/rales.  Comfortable work of breathing. Good air movement. Cardiac: Regular rate and rhythm. Normal S1/S2.  No murmurs, rubs, nor gallops.   Extremities: No peripheral edema.  Strong peripheral pulses.  Mental Status: No depression, anxiety, nor agitation. Skin: Warm and dry.  Assessment & Plan: Toni Harmon was seen today for asthma and hypotension.  Diagnoses and all orders for this visit:  Asthma, chronic, severe persistent, uncomplicated -     Ambulatory referral to Allergy  Essential hypertension, benign  Cramp in limb -     Potassium  Other orders -     cetirizine HCl (ZYRTEC CHILDRENS ALLERGY) 5 MG/5ML SYRP; Take 5 mLs (5 mg total) by mouth daily.   Asthma: Uncontrolled chronic condition with nocturnal symptoms, I propose that she start on cromolyn nebulized every 6 hours. She's going to check with her  work to see if she can bring her nebulizer at work and will call me if this is feasible. Continue all other asthma medications. Joint decision to to refer for a second opinion on other forms of management Essential hypertension: Controlled continue Hydrocort thiazide Cramping: Checking potassium level  Return in about 3 months (around 11/08/2015).

## 2015-08-24 ENCOUNTER — Emergency Department
Admission: EM | Admit: 2015-08-24 | Discharge: 2015-08-24 | Disposition: A | Discharge status: Home / Self Care | Payer: Federal, State, Local not specified - PPO | Source: Home / Self Care | Attending: Family Medicine | Admitting: Family Medicine

## 2015-08-24 ENCOUNTER — Encounter: Payer: Self-pay | Admitting: *Deleted

## 2015-08-24 DIAGNOSIS — R110 Nausea: Secondary | ICD-10-CM

## 2015-08-24 DIAGNOSIS — Z331 Pregnant state, incidental: Secondary | ICD-10-CM

## 2015-08-24 DIAGNOSIS — O26811 Pregnancy related exhaustion and fatigue, first trimester: Secondary | ICD-10-CM

## 2015-08-24 LAB — POCT CBC W AUTO DIFF (K'VILLE URGENT CARE)

## 2015-08-24 LAB — COMPLETE METABOLIC PANEL WITH GFR
ALT: 11 U/L (ref 6–29)
AST: 11 U/L (ref 10–30)
Albumin: 4.4 g/dL (ref 3.6–5.1)
Alkaline Phosphatase: 35 U/L (ref 33–115)
BUN: 22 mg/dL (ref 7–25)
CO2: 23 mmol/L (ref 20–31)
Calcium: 9.8 mg/dL (ref 8.6–10.2)
Chloride: 105 mmol/L (ref 98–110)
Creat: 0.72 mg/dL (ref 0.50–1.10)
GFR, Est African American: >89 mL/min (ref >=60)
GFR, Est Non African American: >89 mL/min (ref >=60)
Glucose, Bld: 63 mg/dL — ABNORMAL LOW (ref 65–99)
Potassium: 4.7 mmol/L (ref 3.5–5.3)
Sodium: 137 mmol/L (ref 135–146)
Total Bilirubin: 0.3 mg/dL (ref 0.2–1.2)
Total Protein: 6.7 g/dL (ref 6.1–8.1)

## 2015-08-24 LAB — HCG, QUANTITATIVE, PREGNANCY: hCG, Beta Chain, Quant, S: 1314.8 m[IU]/mL — ABNORMAL HIGH

## 2015-08-24 LAB — POCT URINE PREGNANCY: PREG TEST UR: POSITIVE — AB

## 2015-08-24 NOTE — ED Provider Notes (Signed)
CSN: [id number]     Arrival date & time 08/24/15  1249 History   First MD Initiated Contact with Patient 08/24/15 1321     Chief Complaint  Patient presents with  . Fatigue  . Nausea   (Consider location/radiation/quality/duration/timing/severity/associated sxs/prior Treatment) HPI  Pt is a 39yo female presenting to [facility] with c/o fatigue and nausea that started about 1 week ago. Pt also reports nasal congestion with sneezing for 2 days but relates that to her allergies.  Pt c/o mild intermittent generalized headache and reports mild fatigue. She also reports mild intermittent abdominal cramping but denies abdominal pain or back pain at this time. Denies vomiting or diarrhea. Denies fever or chills.  Denies urinary or vaginal symptoms. Pt does note she is due for her menstrual cycle but also notes her cycles have been irregular after a miscarriage in April of this year.  Past Medical History  Diagnosis Date  . Asthma   . GERD (gastroesophageal reflux disease)   . Hypertension     only to combat the effects of hctz   Past Surgical History  Procedure Laterality Date  . Tonsillectomy  2003  . Dilation and curettage of uterus  12/2011   Family History  Problem Relation Age of Onset  . Cervical cancer Mother 47  . Hypertension Mother   . Diabetes Maternal Grandfather    Social History  Substance Use Topics  . Smoking status: Never Smoker   . Smokeless tobacco: Never Used  . Alcohol Use: No   OB History    Gravida Para Term Preterm AB TAB SAB Ectopic Multiple Living   0     Review of Systems  Constitutional: Positive for fatigue. Negative for fever and chills.  HENT: Positive for congestion, rhinorrhea and sneezing. Negative for ear pain, sore throat, trouble swallowing and voice change.   Respiratory: Negative for cough and shortness of breath.   Cardiovascular: Negative for chest pain and palpitations.  Gastrointestinal: Positive for nausea and abdominal pain (  cramping). Negative for vomiting and diarrhea.  Genitourinary: Positive for pelvic pain ( cramping). Negative for dysuria, urgency, frequency, hematuria, flank pain, decreased urine volume, vaginal bleeding, vaginal discharge and vaginal pain.  Musculoskeletal: Negative for myalgias, back pain and arthralgias.  Skin: Negative for rash.  Neurological: Positive for headaches. Negative for dizziness and light-headedness.    Allergies  Compazine; Latex; Other; and Peanut-containing drug products  Home Medications   Prior to Admission medications   Medication Sig Start Date End Date Taking? Authorizing Provider  albuterol (PROVENTIL HFA;VENTOLIN HFA) 108 (90 BASE) MCG/ACT inhaler Inhale 2 puffs into the lungs every 4 (four) hours as needed for wheezing. Please supply Ventolin [facility] 11/01/14 11/01/15  Laren Boom, DO  AMBULATORY NON FORMULARY MEDICATION Nebulizer with necessary accessories.  Dx: Moderate Persistent Asthma 10/26/13   [name], DO  budesonide (RHINOCORT AQUA) 32 MCG/ACT nasal spray Place 2 sprays into both nostrils daily. 11/01/14   Laren Boom, DO  cetirizine HCl (ZYRTEC CHILDRENS ALLERGY) 5 MG/5ML SYRP Take 5 mLs (5 mg total) by mouth daily. 08/08/15   [name], DO  EPINEPHrine 0.3 mg/0.3 mL IJ SOAJ injection Inject 0.3 mLs (0.3 mg total) into the muscle once. 02/05/15   [name], DO  Fluticasone-Salmeterol (ADVAIR DISKUS) 500-50 MCG/DOSE AEPB Inhale 1 puff into the lungs 2 (two) times daily. 05/09/15   [name], DO  HYDROCHLOROTHIAZIDE PO Take 1 tablet by mouth daily.    Historical  Provider, MD  loratadine (CLARITIN) 10 MG tablet Take 10 mg by mouth daily.    Historical Provider, MD  montelukast (SINGULAIR) 10 MG tablet Take 1 tablet (10 mg total) by mouth at bedtime. 11/01/14   Laren BoomSean Hommel, DO   Meds Ordered and Administered this Visit  Medications - No data to display  BP 102/67 mmHg  Pulse 87  Temp(Src) 98.7 F (37.1 C) (Oral)  Resp 16  Wt 144 lb (65.318 kg)  SpO2 100%   LMP 07/27/2015 No data found.   Physical Exam  Constitutional: She appears well-developed and well-nourished. No distress.  Pt appears well, non-toxic. NAD  HENT:  Head: Normocephalic and atraumatic.  Right Ear: Hearing, tympanic membrane, external ear and ear canal normal.  Left Ear: Hearing, tympanic membrane, external ear and ear canal normal.  Nose: Nose normal.  Mouth/Throat: Uvula is midline, oropharynx is clear and moist and mucous membranes are normal.  Eyes: Conjunctivae are normal. No scleral icterus.  Neck: Normal range of motion. Neck supple.  Cardiovascular: Normal rate, regular rhythm and normal heart sounds.   Pulmonary/Chest: Effort normal and breath sounds normal. No respiratory distress. She has no wheezes. She has no rales. She exhibits no tenderness.  Abdominal: Soft. Bowel sounds are normal. She exhibits no distension and no mass. There is no tenderness. There is no rebound, no guarding and no CVA tenderness.  Soft, non-distended. Non-tender  Musculoskeletal: Normal range of motion.  Neurological: She is alert.  Skin: Skin is warm and dry. She is not diaphoretic.  Nursing note and vitals reviewed.   ED Course  Procedures (including critical care time)  Labs Review Labs Reviewed  POCT URINE PREGNANCY - Abnormal; Notable for the following:    Preg Test, Ur Positive (*)    All other components within normal limits  COMPLETE METABOLIC PANEL WITH GFR  HCG, QUANTITATIVE, PREGNANCY  POCT CBC W AUTO DIFF ([facility])    Imaging Review No results found.    MDM   1. Pregnant state, incidental   2. Nausea   3. Pregnancy related fatigue in first trimester     Pt is a 39yo female [facility] with c/o fatigue, nausea and mild intermittent headache and abdominal cramping.  Vitals: WNL Abd: soft, non-distended, non-tender  Urine pregnancy: Positive Doubt ectopic pregnancy at this time.  Discussed results with pt. Encouraged to f/u with  PCP and OB/GYN Labs sent: CBC, CMP, and Quantitative Hcg   Discussed symptoms that warrant emergent care in the ED. Patient verbalized understanding and agreement with treatment plan.     Junius Finnerrin O'Malley, PA-C 08/24/15 1348

## 2015-08-24 NOTE — ED Notes (Signed)
Pt c/o 1 week of nausea and fatigue. Occasional HA.

## 2015-08-25 ENCOUNTER — Telehealth: Payer: Self-pay | Admitting: Emergency Medicine

## 2015-09-14 ENCOUNTER — Emergency Department
Admission: EM | Admit: 2015-09-14 | Discharge: 2015-09-14 | Disposition: A | Discharge status: Home / Self Care | Payer: Federal, State, Local not specified - PPO | Source: Home / Self Care | Attending: Family Medicine | Admitting: Family Medicine

## 2015-09-14 DIAGNOSIS — R519 Headache, unspecified: Secondary | ICD-10-CM

## 2015-09-14 DIAGNOSIS — R51 Headache: Secondary | ICD-10-CM

## 2015-09-14 DIAGNOSIS — J069 Acute upper respiratory infection, unspecified: Secondary | ICD-10-CM

## 2015-09-14 DIAGNOSIS — B9789 Other viral agents as the cause of diseases classified elsewhere: Principal | ICD-10-CM

## 2015-09-14 MED ORDER — METOCLOPRAMIDE HCL 5 MG PO TABS: ORAL_TABLET | ORAL | Status: DC

## 2015-09-14 MED ORDER — AMOXICILLIN 875 MG PO TABS: 875.0000 mg | ORAL_TABLET | Freq: Two times a day (BID) | ORAL | Status: DC

## 2015-09-14 NOTE — Discharge Instructions (Signed)
Take plain guaifenesin (1200mg  extended release tabs such as Mucinex) twice daily, with plenty of water, for cough and congestion.  May add Pseudoephedrine (30mg , one or two every 4 to 6 hours) for sinus congestion.  Get adequate rest.    Also recommend using saline nasal spray several times daily and saline nasal irrigation (AYR is a common brand).  Use Rhinocort nasal spray each morning after saline nasal irrigation. Try warm salt water gargles for sore throat.  Stop all antihistamines for now, and other non-prescription cough/cold preparations. Continue all asthma medications. May take Delsym Cough Suppressant at bedtime for nighttime cough.  Begin Amoxicillin if not improving about one week or if persistent fever develops   Follow-up with family doctor if not improving about 7 to 10 days.

## 2015-09-14 NOTE — ED Notes (Signed)
Sinus pressure started 1 week ago, pain under eyes and back of head, denies ear pain eye pain.  Alternates between runny nose and congestion.  Has allergies and asthma.

## 2015-09-14 NOTE — ED Provider Notes (Signed)
CSN: [id number]     Arrival date & time 09/14/15  1148 History   First MD Initiated Contact with Patient 09/14/15 1218     Chief Complaint  Patient presents with  . Headache  . Facial Pain     HPI Comments: Patient complains of five day history of typical cold-like symptoms including sinus congestion, headache, fatigue, and cough.  She has a history of asthma and has been using her Ventolin inhaler more often.  The history is provided by the patient.    Past Medical History  Diagnosis Date  . Asthma   . GERD (gastroesophageal reflux disease)   . Hypertension     only to combat the effects of hctz   Past Surgical History  Procedure Laterality Date  . Tonsillectomy  2003  . Dilation and curettage of uterus  12/2011   Family History  Problem Relation Age of Onset  . Cervical cancer Mother 72  . Hypertension Mother   . Diabetes Maternal Grandfather    Social History  Substance Use Topics  . Smoking status: Never Smoker   . Smokeless tobacco: Never Used  . Alcohol Use: No   OB History    Gravida Para Term Preterm AB TAB SAB Ectopic Multiple Living   0     Review of Systems No sore throat + cough No pleuritic pain + wheezing + nasal congestion + post-nasal drainage No sinus pain/pressure No itchy/red eyes No earache No hemoptysis + SOB No fever, + chills No nausea No vomiting No abdominal pain No diarrhea No urinary symptoms No skin rash + fatigue No myalgias + headache Used OTC meds without relief  Allergies  Compazine; Latex; Other; and Peanut-containing drug products  Home Medications   Prior to Admission medications   Medication Sig Start Date End Date Taking? Authorizing Provider  albuterol (PROVENTIL HFA;VENTOLIN HFA) 108 (90 BASE) MCG/ACT inhaler Inhale 2 puffs into the lungs every 4 (four) hours as needed for wheezing. Please supply Ventolin [facility] 11/01/14 11/01/15  Laren Boom, DO  AMBULATORY NON FORMULARY MEDICATION Nebulizer with  necessary accessories.  Dx: Moderate Persistent Asthma 10/26/13   Laren Boom, DO  amoxicillin (AMOXIL) 875 MG tablet Take 1 tablet (875 mg total) by mouth 2 (two) times daily. (Rx void after 09/22/15) 09/14/15   [name], MD  budesonide (RHINOCORT AQUA) 32 MCG/ACT nasal spray Place 2 sprays into both nostrils daily. 11/01/14   Laren Boom, DO  cetirizine HCl (ZYRTEC CHILDRENS ALLERGY) 5 MG/5ML SYRP Take 5 mLs (5 mg total) by mouth daily. 08/08/15   [name], DO  EPINEPHrine 0.3 mg/0.3 mL IJ SOAJ injection Inject 0.3 mLs (0.3 mg total) into the muscle once. 02/05/15   [name], DO  Fluticasone-Salmeterol (ADVAIR DISKUS) 500-50 MCG/DOSE AEPB Inhale 1 puff into the lungs 2 (two) times daily. 05/09/15   [name], DO  HYDROCHLOROTHIAZIDE PO Take 1 tablet by mouth daily.    Historical Provider, MD  loratadine (CLARITIN) 10 MG tablet Take 10 mg by mouth daily.    Historical Provider, MD  metoCLOPramide (REGLAN) 5 MG tablet May take by mouth one to three times daily prior to Tylenol for nausea 09/14/15   [name], MD  montelukast (SINGULAIR) 10 MG tablet Take 1 tablet (10 mg total) by mouth at bedtime. 11/01/14   Laren Boom, DO   Meds Ordered and [id number] this Visit  Medications - No data to display  BP 113/74 mmHg  Pulse 80  Temp(Src) 98 F (36.7 C) (Oral)  Ht 5\' 6"  (1.676 m)  Wt 154 lb 12 oz (70.194 kg)  BMI 24.99 kg/m2  SpO2 100%  LMP 07/27/2015 No data found.   Physical Exam Nursing notes and Vital Signs reviewed. Appearance:  Patient appears stated age, and in no acute distress Eyes:  Pupils are equal, round, and reactive to light and accomodation.  Extraocular movement is intact.  Conjunctivae are not inflamed  Ears:  Canals normal.  Tympanic membranes normal.  Nose:  Mildly congested turbinates.  No sinus tenderness.   Pharynx:  Normal Neck:  Supple.   Tender enlarged posterior nodes are palpated bilaterally  Lungs:  Clear to auscultation.  Breath sounds are equal.   Moving air well. Chest:  Distinct tenderness to palpation over the mid-sternum.  Heart:  Regular rate and rhythm without murmurs, rubs, or gallops.  Abdomen:  Nontender without masses or hepatosplenomegaly.  Bowel sounds are present.  No CVA or flank tenderness.  Extremities:  No edema.    Skin:  No rash present.   ED Course  Procedures none  MDM   1. Viral URI with cough   2. Sinus headache    There is no evidence of bacterial infection today.   Treat symptomatically for now.  For headache, try Tylenol, preceding with Reglan to control nausea. Take plain guaifenesin (1200mg  extended release tabs such as Mucinex) twice daily, with plenty of water, for cough and congestion.  May add Pseudoephedrine (30mg , one or two every 4 to 6 hours) for sinus congestion.  Get adequate rest.    Also recommend using saline nasal spray several times daily and saline nasal irrigation (AYR is a common brand).  Use Rhinocort nasal spray each morning after saline nasal irrigation. Try warm salt water gargles for sore throat.  Stop all antihistamines for now, and other non-prescription cough/cold preparations. Continue all asthma medications. May take Delsym Cough Suppressant at bedtime for nighttime cough.  Begin Amoxicillin if not improving about one week or if persistent fever develops (Given a prescription to hold, with an expiration date)  Follow-up with family doctor if not improving about 7 to 10 days.     Lattie HawStephen A Claryssa Sandner, MD 09/20/15 [phone]

## 2015-09-29 ENCOUNTER — Encounter (HOSPITAL_COMMUNITY): Payer: Self-pay

## 2015-09-29 ENCOUNTER — Inpatient Hospital Stay (HOSPITAL_COMMUNITY)
Admission: AD | Admit: 2015-09-29 | Discharge: 2015-09-29 | Disposition: A | Discharge status: Home / Self Care | Payer: Federal, State, Local not specified - PPO | Source: Ambulatory Visit | Attending: Family Medicine | Admitting: Family Medicine

## 2015-09-29 ENCOUNTER — Inpatient Hospital Stay (HOSPITAL_COMMUNITY): Payer: Federal, State, Local not specified - PPO

## 2015-09-29 DIAGNOSIS — Z91040 Latex allergy status: Secondary | ICD-10-CM | POA: Diagnosis not present

## 2015-09-29 DIAGNOSIS — J45909 Unspecified asthma, uncomplicated: Secondary | ICD-10-CM | POA: Insufficient documentation

## 2015-09-29 DIAGNOSIS — O99511 Diseases of the respiratory system complicating pregnancy, first trimester: Secondary | ICD-10-CM | POA: Insufficient documentation

## 2015-09-29 DIAGNOSIS — O209 Hemorrhage in early pregnancy, unspecified: Secondary | ICD-10-CM | POA: Diagnosis not present

## 2015-09-29 DIAGNOSIS — Z3A09 9 weeks gestation of pregnancy: Secondary | ICD-10-CM | POA: Diagnosis not present

## 2015-09-29 DIAGNOSIS — N939 Abnormal uterine and vaginal bleeding, unspecified: Secondary | ICD-10-CM | POA: Diagnosis present

## 2015-09-29 DIAGNOSIS — O4691 Antepartum hemorrhage, unspecified, first trimester: Secondary | ICD-10-CM

## 2015-09-29 DIAGNOSIS — O3680X0 Pregnancy with inconclusive fetal viability, not applicable or unspecified: Secondary | ICD-10-CM

## 2015-09-29 LAB — CBC
HCT: 33.9 % — ABNORMAL LOW (ref 36.0–46.0)
Hemoglobin: 11.3 g/dL — ABNORMAL LOW (ref 12.0–15.0)
MCH: 28 pg (ref 26.0–34.0)
MCHC: 33.3 g/dL (ref 30.0–36.0)
MCV: 84.1 fL (ref 78.0–100.0)
PLATELETS: 378 10*3/uL (ref 150–400)
RBC: 4.03 MIL/uL (ref 3.87–5.11)
RDW: 13.3 % (ref 11.5–15.5)
WBC: 5.3 10*3/uL (ref 4.0–10.5)

## 2015-09-29 LAB — WET PREP, GENITAL
CLUE CELLS WET PREP: NONE SEEN
SPERM: NONE SEEN
Trich, Wet Prep: NONE SEEN
WBC, Wet Prep HPF POC: NONE SEEN
Yeast Wet Prep HPF POC: NONE SEEN

## 2015-09-29 LAB — URINALYSIS, ROUTINE W REFLEX MICROSCOPIC
BILIRUBIN URINE: NEGATIVE
Glucose, UA: NEGATIVE mg/dL
Ketones, ur: 15 mg/dL — AB
Leukocytes, UA: NEGATIVE
Nitrite: NEGATIVE
PH: 5.5 (ref 5.0–8.0)
Protein, ur: NEGATIVE mg/dL

## 2015-09-29 LAB — URINE MICROSCOPIC-ADD ON

## 2015-09-29 LAB — HCG, QUANTITATIVE, PREGNANCY: hCG, Beta Chain, Quant, S: 5278 m[IU]/mL — ABNORMAL HIGH (ref <5)

## 2015-09-29 LAB — HIV ANTIBODY (ROUTINE TESTING W REFLEX): HIV SCREEN 4TH GENERATION: NONREACTIVE

## 2015-09-29 NOTE — MAU Provider Note (Signed)
Chief Complaint: Vaginal Bleeding   None     SUBJECTIVE HPI: Toni Harmon is a 39 y.o. G5P0040 at [redacted]w[redacted]d by LMP who presents to maternity admissions reporting onset of heavy vaginal bleeding at 5 pm today, requiring a pad change every hour with small clots. She reports this bleeding is heavier than her usual periods.  It is not associated with abdominal or back pain.  She has not tried any treatments for the bleeding and it is gradually improving, with less bleeding here in MAU than at home. She denies vaginal itching/burning, urinary symptoms, h/a, dizziness, n/v, or fever/chills.     HPI  Past Medical History  Diagnosis Date  . Asthma   . GERD (gastroesophageal reflux disease)   . Hypertension     only to combat the effects of hctz   Past Surgical History  Procedure Laterality Date  . Tonsillectomy  2003  . Dilation and curettage of uterus  12/2011   Social History   Social History  . Marital Status: Married    Spouse Name: N/A  . Number of Children: N/A  . Years of Education: N/A   Occupational History  . Not on file.   Social History Main Topics  . Smoking status: Never Smoker   . Smokeless tobacco: Never Used  . Alcohol Use: No  . Drug Use: No  . Sexual Activity:    Partners: Male    Birth Control/ Protection: None   Other Topics Concern  . Not on file   Social History Narrative   No current facility-administered medications on file prior to encounter.   Current Outpatient Prescriptions on File Prior to Encounter  Medication Sig Dispense Refill  . albuterol (PROVENTIL HFA;VENTOLIN HFA) 108 (90 BASE) MCG/ACT inhaler Inhale 2 puffs into the lungs every 4 (four) hours as needed for wheezing. Please supply Ventolin HFA 1 Inhaler 2  . AMBULATORY NON FORMULARY MEDICATION Nebulizer with necessary accessories.  Dx: Moderate Persistent Asthma 1 Units 0  . budesonide (RHINOCORT AQUA) 32 MCG/ACT nasal spray Place 2 sprays into both nostrils daily. 8.6 g 11  .  cetirizine HCl (ZYRTEC CHILDRENS ALLERGY) 5 MG/5ML SYRP Take 5 mLs (5 mg total) by mouth daily. 300 mL   . Fluticasone-Salmeterol (ADVAIR DISKUS) 500-50 MCG/DOSE AEPB Inhale 1 puff into the lungs 2 (two) times daily. 60 each 5  . loratadine (CLARITIN) 10 MG tablet Take 10 mg by mouth daily.    . metoCLOPramide (REGLAN) 5 MG tablet May take by mouth one to three times daily prior to Tylenol for nausea 15 tablet 0  . montelukast (SINGULAIR) 10 MG tablet Take 1 tablet (10 mg total) by mouth at bedtime. 90 tablet 3  . amoxicillin (AMOXIL) 875 MG tablet Take 1 tablet (875 mg total) by mouth 2 (two) times daily. (Rx void after 09/22/15) 20 tablet 0  . EPINEPHrine 0.3 mg/0.3 mL IJ SOAJ injection Inject 0.3 mLs (0.3 mg total) into the muscle once. 1 Device 0  . HYDROCHLOROTHIAZIDE PO Take 1 tablet by mouth daily.     Allergies  Allergen Reactions  . Compazine [Prochlorperazine Edisylate]   . Latex   . Other     Tree nuts, melon, gluten intolerance, eggs  . Peanut-Containing Drug Products     ROS:  Review of Systems  Constitutional: Negative for fever, chills and fatigue.  Respiratory: Negative for shortness of breath.   Cardiovascular: Negative for chest pain.  Genitourinary: Positive for vaginal bleeding. Negative for dysuria, flank pain, vaginal discharge, difficulty  urinating, vaginal pain and pelvic pain.  Neurological: Negative for dizziness and headaches.  Psychiatric/Behavioral: Negative.      I have reviewed patient's Past Medical Hx, Surgical Hx, Family Hx, Social Hx, medications and allergies.   Physical Exam   Patient Vitals for the past 24 hrs:  BP Temp Temp src Pulse Resp SpO2  09/29/15 0200 126/71 mmHg 97.9 F (36.6 C) Oral 68 16 100 %   Constitutional: Well-developed, well-nourished female in no acute distress.  Cardiovascular: normal rate Respiratory: normal effort GI: Abd soft, non-tender. Pos BS x 4 MS: Extremities nontender, no edema, normal ROM Neurologic: Alert  and oriented x 4.  GU: Neg CVAT.  PELVIC EXAM: Cervix pink, visually closed, without lesion, small amount dark red bleeding, vaginal walls and external genitalia normal Bimanual exam: Cervix 0/long/high, firm, anterior, neg CMT, uterus nontender, nonenlarged, adnexa without tenderness, enlargement, or mass   LAB RESULTS Results for orders placed or performed during the hospital encounter of 09/29/15 (from the past 24 hour(s))  Urinalysis, Routine w reflex microscopic (not [facility])     Status: Abnormal   Collection Time: 09/29/15  1:37 AM  Result Value Ref Range   Color, Urine YELLOW YELLOW   APPearance CLEAR CLEAR   Specific Gravity, Urine <1.005 (L) 1.005 - 1.030   pH 5.5 5.0 - 8.0   Glucose, UA NEGATIVE NEGATIVE mg/dL   Hgb urine dipstick LARGE (A) NEGATIVE   Bilirubin Urine NEGATIVE NEGATIVE   Ketones, ur 15 (A) NEGATIVE mg/dL   Protein, ur NEGATIVE NEGATIVE mg/dL   Nitrite NEGATIVE NEGATIVE   Leukocytes, UA NEGATIVE NEGATIVE  Urine microscopic-add on     Status: Abnormal   Collection Time: 09/29/15  1:37 AM  Result Value Ref Range   Squamous Epithelial / LPF 0-5 (A) NONE SEEN   WBC, UA 0-5 0 - 5 WBC/hpf   RBC / HPF 6-30 0 - 5 RBC/hpf   Bacteria, UA FEW (A) NONE SEEN  hCG, quantitative, pregnancy     Status: Abnormal   Collection Time: 09/29/15  2:22 AM  Result Value Ref Range   hCG, Beta Chain, Quant, S 5278 (H) <5 mIU/mL  CBC     Status: Abnormal   Collection Time: 09/29/15  2:23 AM  Result Value Ref Range   WBC 5.3 4.0 - 10.5 K/uL   RBC 4.03 3.87 - 5.11 MIL/uL   Hemoglobin 11.3 (L) 12.0 - 15.0 g/dL   HCT 16.133.9 (L) 09.636.0 - 04.546.0 %   MCV 84.1 78.0 - 100.0 fL   MCH 28.0 26.0 - 34.0 pg   MCHC 33.3 30.0 - 36.0 g/dL   RDW 40.913.3 81.111.5 - 91.415.5 %   Platelets 378 150 - 400 K/uL  Wet prep, genital     Status: None   Collection Time: 09/29/15  3:14 AM  Result Value Ref Range   Yeast Wet Prep HPF POC NONE SEEN NONE SEEN   Trich, Wet Prep NONE SEEN NONE SEEN   Clue Cells Wet Prep  HPF POC NONE SEEN NONE SEEN   WBC, Wet Prep HPF POC NONE SEEN NONE SEEN   Sperm NONE SEEN        IMAGING [place] Ob Comp Less 14 Wks  09/29/2015  CLINICAL DATA:  39 year old female with bleeding since yesterday. Pelvic cramping. EXAM: OBSTETRIC <14 WK US AND TRANSVAGINAL OB US TECHNIQUE: Both transabdominal and transvaginal ultrasound examinations were performed for complete evaluation of the gestation as well as the maternal uterus, adnexal regions, and pelvic cul-de-sac.  Transvaginal technique was performed to assess early pregnancy. COMPARISON:  Ultrasound dated 03/20/2015. FINDINGS: The uterus appears retroverted and demonstrates a heterogeneous echotexture. A 2.3 x 1.7 x 2.0 cm ill-defined heterogeneous area in the right fundal myometrium may represent a fibroid. The endometrium is thickened and measures 2.5 cm. No intrauterine pregnancy identified. In the absence of documented intrauterine pregnancy, possibly of an ectopic pregnancy is not excluded. Clinical correlation and follow-up with ultrasound and serial HCG levels recommended. The right ovary measures 3.0 x 1.8 x 3.0 cm and the left ovary measures 4.5 x 1.6 x 5.0 cm. Left ovarian corpus luteum may be present. Trace free fluid within the pelvis. IMPRESSION: Thickened endometrium. No intrauterine pregnancy identified. Correlation with HCG level and follow-up with ultrasound recommended. Heterogeneous uterus with possible fundal fibroid. Unremarkable ovaries. Electronically Signed   By: Elgie Collard M.D.   On: 09/29/2015 03:21   US Ob Transvaginal  09/29/2015  CLINICAL DATA:  39 year old female with bleeding since yesterday. Pelvic cramping. EXAM: OBSTETRIC <14 WK [place] AND TRANSVAGINAL OB US TECHNIQUE: Both transabdominal and transvaginal ultrasound examinations were performed for complete evaluation of the gestation as well as the maternal uterus, adnexal regions, and pelvic cul-de-sac. Transvaginal technique was performed to assess early  pregnancy. COMPARISON:  Ultrasound dated 03/20/2015. FINDINGS: The uterus appears retroverted and demonstrates a heterogeneous echotexture. A 2.3 x 1.7 x 2.0 cm ill-defined heterogeneous area in the right fundal myometrium may represent a fibroid. The endometrium is thickened and measures 2.5 cm. No intrauterine pregnancy identified. In the absence of documented intrauterine pregnancy, possibly of an ectopic pregnancy is not excluded. Clinical correlation and follow-up with ultrasound and serial HCG levels recommended. The right ovary measures 3.0 x 1.8 x 3.0 cm and the left ovary measures 4.5 x 1.6 x 5.0 cm. Left ovarian corpus luteum may be present. Trace free fluid within the pelvis. IMPRESSION: Thickened endometrium. No intrauterine pregnancy identified. Correlation with HCG level and follow-up with ultrasound recommended. Heterogeneous uterus with possible fundal fibroid. Unremarkable ovaries. Electronically Signed   By: Elgie Collard M.D.   On: 09/29/2015 03:21    MAU Management/MDM: Ordered labs and reviewed results.  Vaginal bleeding and thickened endometrium most consistent with SAB but ectopic not ruled out today. Plan for pt to return in 48 hours for repeat quant hcg.  Bleeding and ectopic precautions given.  Pt stable at time of discharge.  ASSESSMENT 1. Pregnancy of unknown anatomic location   2. Vaginal bleeding in pregnancy, first trimester     PLAN Discharge home with bleeding and ectopic precautions   Follow-up Information    Follow up with [facility] [place] [facility].   Why:  On Monday 12/26 in the morning, or sooner as needed   Contact information:   [address] [id number] mc [place] [address] [phone]      Sharen Counter Certified Nurse-Midwife 09/29/2015  4:07 AM

## 2015-09-29 NOTE — Discharge Instructions (Signed)

## 2015-09-29 NOTE — MAU Note (Signed)
At 5 pm, started bleeding, soaking a pad an hour with one tiny clot.  Lower abd and back cramping started at 8 pm.

## 2015-10-04 ENCOUNTER — Telehealth: Payer: Self-pay | Admitting: *Deleted

## 2015-10-04 DIAGNOSIS — O209 Hemorrhage in early pregnancy, unspecified: Secondary | ICD-10-CM

## 2015-10-04 NOTE — Telephone Encounter (Signed)
Pt was seen in MAU 09/29/15  Pt to rtn for repeat BHCG per l leftwich-Kirby, CNM

## 2015-10-05 LAB — GC/CHLAMYDIA PROBE AMP (~~LOC~~) NOT AT ARMC
Chlamydia: NEGATIVE
Neisseria Gonorrhea: NEGATIVE

## 2015-10-11 ENCOUNTER — Telehealth: Payer: Self-pay | Admitting: *Deleted

## 2015-10-11 LAB — HCG, QUANTITATIVE, PREGNANCY: hCG, Beta Chain, Quant, S: 1682.8 m[IU]/mL — ABNORMAL HIGH

## 2015-10-11 NOTE — Telephone Encounter (Signed)
Pt notified of dropping HCG which is now 1682.8.  She will return in 2 weeks for a repeat.  Explained to patient why she needs to have blood levels followed until <5.

## 2015-10-24 ENCOUNTER — Other Ambulatory Visit (INDEPENDENT_AMBULATORY_CARE_PROVIDER_SITE_OTHER): Payer: Federal, State, Local not specified - PPO | Admitting: *Deleted

## 2015-10-24 DIAGNOSIS — O039 Complete or unspecified spontaneous abortion without complication: Secondary | ICD-10-CM

## 2015-10-25 ENCOUNTER — Telehealth: Payer: Self-pay | Admitting: *Deleted

## 2015-10-25 LAB — HCG, QUANTITATIVE, PREGNANCY: HCG, BETA CHAIN, QUANT, S: 177.4 m[IU]/mL — AB

## 2015-10-25 NOTE — Telephone Encounter (Signed)
Pt notified of declining BHCG of 177.  She will return in 4 weeks for a repeat.

## 2015-11-02 ENCOUNTER — Other Ambulatory Visit: Payer: Self-pay | Admitting: Family Medicine

## 2015-11-02 ENCOUNTER — Other Ambulatory Visit: Payer: Self-pay

## 2015-11-02 MED ORDER — MONTELUKAST SODIUM 10 MG PO TABS: 10.0000 mg | ORAL_TABLET | Freq: Every day | ORAL | Status: DC

## 2015-11-02 MED ORDER — HYDROCHLOROTHIAZIDE 25 MG PO TABS: 25.0000 mg | ORAL_TABLET | Freq: Every day | ORAL | Status: DC

## 2015-11-07 ENCOUNTER — Other Ambulatory Visit: Payer: Self-pay | Admitting: Family Medicine

## 2015-11-08 ENCOUNTER — Ambulatory Visit (INDEPENDENT_AMBULATORY_CARE_PROVIDER_SITE_OTHER): Payer: Federal, State, Local not specified - PPO | Admitting: Family Medicine

## 2015-11-08 ENCOUNTER — Encounter: Payer: Self-pay | Admitting: Family Medicine

## 2015-11-08 ENCOUNTER — Telehealth: Payer: Self-pay | Admitting: Family Medicine

## 2015-11-08 VITALS — BP 102/70 | HR 76 | Wt 146.0 lb

## 2015-11-08 DIAGNOSIS — J4550 Severe persistent asthma, uncomplicated: Secondary | ICD-10-CM

## 2015-11-08 DIAGNOSIS — I10 Essential (primary) hypertension: Secondary | ICD-10-CM | POA: Diagnosis not present

## 2015-11-08 MED ORDER — ALBUTEROL SULFATE HFA 108 (90 BASE) MCG/ACT IN AERS: INHALATION_SPRAY | RESPIRATORY_TRACT | Status: DC

## 2015-11-08 MED ORDER — BUDESONIDE 32 MCG/ACT NA SUSP: 2.0000 | Freq: Every day | NASAL | Status: DC

## 2015-11-08 MED ORDER — HYDROCHLOROTHIAZIDE 25 MG PO TABS: 25.0000 mg | ORAL_TABLET | Freq: Every day | ORAL | Status: DC

## 2015-11-08 MED ORDER — METOCLOPRAMIDE HCL 5 MG PO TABS: ORAL_TABLET | ORAL | Status: DC

## 2015-11-08 MED ORDER — MONTELUKAST SODIUM 10 MG PO TABS: 10.0000 mg | ORAL_TABLET | Freq: Every day | ORAL | Status: DC

## 2015-11-08 MED ORDER — LORATADINE 10 MG PO TABS: 10.0000 mg | ORAL_TABLET | Freq: Every day | ORAL | Status: DC

## 2015-11-08 MED ORDER — CROMOLYN SODIUM 20 MG/2ML IN NEBU: INHALATION_SOLUTION | RESPIRATORY_TRACT | Status: DC

## 2015-11-08 MED ORDER — PSEUDOEPHEDRINE HCL 30 MG PO TABS: 30.0000 mg | ORAL_TABLET | ORAL | Status: DC | PRN

## 2015-11-08 MED ORDER — FLUTICASONE-SALMETEROL 500-50 MCG/DOSE IN AEPB: 1.0000 | INHALATION_SPRAY | Freq: Two times a day (BID) | RESPIRATORY_TRACT | Status: DC

## 2015-11-08 MED ORDER — EPINEPHRINE 0.3 MG/0.3ML IJ SOAJ: 0.3000 mg | Freq: Once | INTRAMUSCULAR | Status: DC

## 2015-11-08 MED ORDER — CETIRIZINE HCL 5 MG/5ML PO SYRP: 5.0000 mg | ORAL_SOLUTION | Freq: Every day | ORAL | Status: DC

## 2015-11-08 NOTE — Telephone Encounter (Signed)
Toni Harmon, Can you please call Toni Harmon at [phone] or [phone] and see if he still is representing [employer], if so He please bring by some more portable non-child nebulizers. When these arrive can you call this patient to have her come by to look if she wants to acquire one of them.

## 2015-11-08 NOTE — Progress Notes (Signed)
CC: Toni Harmon is a 40 y.o. female is here for No chief complaint on file.   Subjective: HPI:  Follow-up essential hypertension: Taking hydrochlorothiazide on a daily basis with no side effects. No outside blood pressures reported. No chest pain orthopnea or peripheral edema.  Follow-up asthma: Continues to take Rhinocort, Zyrtec, Claritin, Advair and Singulair daily. She still has to use albuterol most days out of the week. Occasional nocturnal symptoms. As was also triggered by a bicycle ride she took last week. Albuterol still helps but only for a few hours. Denies fevers, chills, chest pain.   Review Of Systems Outlined In HPI  Past Medical History  Diagnosis Date  . Asthma   . GERD (gastroesophageal reflux disease)   . Hypertension     only to combat the effects of hctz    Past Surgical History  Procedure Laterality Date  . Tonsillectomy  2003  . Dilation and curettage of uterus  12/2011   Family History  Problem Relation Age of Onset  . Cervical cancer Mother 53  . Hypertension Mother   . Diabetes Maternal Grandfather     Social History   Social History  . Marital Status: Married    Spouse Name: N/A  . Number of Children: N/A  . Years of Education: N/A   Occupational History  . Not on file.   Social History Main Topics  . Smoking status: Never Smoker   . Smokeless tobacco: Never Used  . Alcohol Use: No  . Drug Use: No  . Sexual Activity:    Partners: Male    Birth Control/ Protection: None   Other Topics Concern  . Not on file   Social History Narrative     Objective: BP 102/70 mmHg  Pulse 76  Wt 146 lb (66.225 kg)  LMP 07/27/2015  General: Alert and Oriented, No Acute Distress HEENT: Pupils equal, round, reactive to light. Conjunctivae clear.  Moist mucous membranes Lungs: Clear to auscultation bilaterally, no wheezing/ronchi/rales.  Comfortable work of breathing. Good air movement. Cardiac: Regular rate and rhythm. Normal S1/S2.  No  murmurs, rubs, nor gallops.   Extremities: No peripheral edema.  Strong peripheral pulses.  Mental Status: No depression, anxiety, nor agitation. Skin: Warm and dry.  Assessment & Plan: Diagnoses and all orders for this visit:  Essential hypertension, benign  Asthma, chronic, severe persistent, uncomplicated -     budesonide (RHINOCORT AQUA) 32 MCG/ACT nasal spray; Place 2 sprays into both nostrils daily. -     Fluticasone-Salmeterol (ADVAIR DISKUS) 500-50 MCG/DOSE AEPB; Inhale 1 puff into the lungs 2 (two) times daily.  Other orders -     cetirizine HCl (ZYRTEC CHILDRENS ALLERGY) 5 MG/5ML SYRP; Take 5 mLs (5 mg total) by mouth daily. -     EPINEPHrine 0.3 mg/0.3 mL IJ SOAJ injection; Inject 0.3 mLs (0.3 mg total) into the muscle once. -     hydrochlorothiazide (HYDRODIURIL) 25 MG tablet; Take 1 tablet (25 mg total) by mouth daily. -     loratadine (CLARITIN) 10 MG tablet; Take 1 tablet (10 mg total) by mouth daily. -     Discontinue: metoCLOPramide (REGLAN) 5 MG tablet; May take by mouth one to three times daily prior to Tylenol for nausea -     montelukast (SINGULAIR) 10 MG tablet; Take 1 tablet (10 mg total) by mouth at bedtime. -     pseudoephedrine (SUDAFED) 30 MG tablet; Take 1 tablet (30 mg total) by mouth every 4 (four) hours as needed for  congestion. -     albuterol (VENTOLIN HFA) 108 (90 Base) MCG/ACT inhaler; INHALE 2 PUFFS INTO THE LUNGS EVERY 4 HOURS AS NEEDED FOR WHEEZING. -     cromolyn (INTAL) 20 MG/2ML nebulizer solution; Inhale  four times a day, may decrease to twice a day once symptoms improve.   essential hypertension: Controlled continue HCTZ  Asthma: Suboptimal control, she'll be seen in allergy specialist next week to get second opinions on asthma. In the meantime I've encouraged her to consider starting on cromolyn however will hurt my feelings if she decides to wait until she sees her new allergist.   Return in about 3 months (around 02/05/2016).

## 2015-11-12 NOTE — Telephone Encounter (Signed)
Toni Harmon no longer works for [employer] but he gave me the number to [place].  Spoke with [place] and she is willing to bring out samples sometime this week.

## 2015-11-14 ENCOUNTER — Ambulatory Visit (INDEPENDENT_AMBULATORY_CARE_PROVIDER_SITE_OTHER): Payer: Federal, State, Local not specified - PPO | Admitting: Allergy and Immunology

## 2015-11-14 ENCOUNTER — Encounter: Payer: Self-pay | Admitting: Allergy and Immunology

## 2015-11-14 VITALS — BP 110/60 | HR 72 | Temp 98.3°F | Resp 16 | Ht 64.96 in | Wt 144.4 lb

## 2015-11-14 DIAGNOSIS — J3089 Other allergic rhinitis: Secondary | ICD-10-CM | POA: Diagnosis not present

## 2015-11-14 DIAGNOSIS — J4540 Moderate persistent asthma, uncomplicated: Secondary | ICD-10-CM | POA: Diagnosis not present

## 2015-11-14 MED ORDER — PREDNISONE 1 MG PO TABS: 10.0000 mg | ORAL_TABLET | ORAL | Status: DC

## 2015-11-14 NOTE — Patient Instructions (Signed)
Allergic rhinitis We were unable to perform skin tests today due to recent administration of antihistamine.   The patient is scheduled to return in the next 1-2 weeks for allergy skin testing after having been off of antihistamines for at least 3 days.  To control symptoms while off of antihistamines for 3 days, prednisone has been provided: 10g per day for 3 days. Further recommendations will be made at that time based upon skin test results.  Moderate persistent asthma The patient's history is consistent with moderate persistent asthma.  Her spirometry today was normal.  Further recommendations, rate guarding allergen avoidance measures and medication optimization, will be made next week when she returns for further evaluation.   Return in about 1 week (around 11/21/2015) for allergy skin testing.

## 2015-11-14 NOTE — Assessment & Plan Note (Addendum)
We were unable to perform skin tests today due to recent administration of antihistamine.   The patient is scheduled to return in the next 1-2 weeks for allergy skin testing after having been off of antihistamines for at least 3 days.  To control symptoms while off of antihistamines for 3 days, prednisone has been provided: 10g per day for 3 days. Further recommendations will be made at that time based upon skin test results.

## 2015-11-14 NOTE — Assessment & Plan Note (Signed)
The patient's history is consistent with moderate persistent asthma.  Her spirometry today was normal.  Further recommendations, rate guarding allergen avoidance measures and medication optimization, will be made next week when she returns for further evaluation.

## 2015-11-14 NOTE — Assessment & Plan Note (Signed)
>>  ASSESSMENT AND PLAN FOR MODERATE PERSISTENT ASTHMA WRITTEN ON 11/14/2015  5:16 PM BY BOBBITT, RALPH CARTER, MD  The patient's history is consistent with moderate persistent asthma.  Her spirometry today was normal. Further recommendations, rate guarding allergen avoidance measures and medication optimization, will be made next week when she returns for further evaluation.

## 2015-11-14 NOTE — Progress Notes (Signed)
New Patient Note  RE: Toni Harmon MRN: [id number] DOB: 04/25/1976 Date of Office Visit: 11/14/2015  Referring provider: Laren Boom, DO Primary care provider: Laren Boom, DO  Chief Complaint: Asthma; Allergic Rhinitis ; and Breathing Problem   History of present illness: HPI Comments: Toni Harmon is a 40 y.o. female who presents today for evaluation of asthma and rhinitis.  She reports that she was diagnosed with asthma in 2004.  Her asthma symptoms consist of coughing, chest tightness, dyspnea, and wheezing.  Specific triggers include exposure to dust, exposure to pollens, upper respiratory tract infections, and exercise.  She typically requires albuterol rescue one or 2 times per day on average and experiences nocturnal awakenings due to lower respiration symptoms one or 2 times per week on average.  She currently uses Advair discus 500/50, one inhalation twice a day, montelukast 10 mg daily at bedtime, and albuterol every 4-6 hours as needed and 15 minutes prior to exercise. Minnie also complains of nasal congestion, rhinorrhea, sneezing, ocular pruritus, postnasal drainage, and occasional sinus pressure.  These symptoms are triggered by exposure to dust, pollens, cats, dogs, and cold air.  She has failed Flonase, Rhinocort, and Nasacort.   Assessment and plan: Allergic rhinitis We were unable to perform skin tests today due to recent administration of antihistamine.   The patient is scheduled to return in the next 1-2 weeks for allergy skin testing after having been off of antihistamines for at least 3 days.  To control symptoms while off of antihistamines for 3 days, prednisone has been provided: 10g per day for 3 days. Further recommendations will be made at that time based upon skin test results.  Moderate persistent asthma The patient's history is consistent with moderate persistent asthma.  Her spirometry today was normal.  Further recommendations, rate guarding  allergen avoidance measures and medication optimization, will be made next week when she returns for further evaluation.   Diagnositics: Spirometry: FVC was 2.96 L and FEV1 was 2.49 L. Allergy skin testing: We were unable to perform skin tests today due to recent administration of antihistamine.     Physical examination: Blood pressure 110/60, pulse 72, temperature 98.3 F (36.8 C), resp. rate 16, height 5' 4.96" (1.65 m), weight 144 lb 6.4 oz (65.5 kg), last menstrual period 07/27/2015, unknown if currently breastfeeding.  General: Alert, interactive, in no acute distress. HEENT: TMs pearly gray, turbinates edematous and pale with clear discharge, post-pharynx mildly erythematous. Neck: Supple without lymphadenopathy. Lungs: Clear to auscultation without wheezing, rhonchi or rales. CV: Normal S1, S2 without murmurs. Abdomen: Nondistended, nontender. Skin: Warm and dry, without lesions or rashes. Extremities:  No clubbing, cyanosis or edema. Neuro:   Grossly intact.  Review of systems: Review of Systems  Constitutional: Negative for fever, chills and weight loss.  HENT: Positive for congestion. Negative for nosebleeds.   Eyes: Negative for blurred vision.  Respiratory: Positive for cough, sputum production, shortness of breath and wheezing. Negative for hemoptysis.   Cardiovascular: Negative for chest pain.  Gastrointestinal: Negative for diarrhea and constipation.  Genitourinary: Negative for dysuria.  Musculoskeletal: Negative for myalgias and joint pain.  Neurological: Positive for headaches. Negative for dizziness.  Endo/Heme/Allergies: Positive for environmental allergies. Does not bruise/bleed easily.    Past medical history: Past Medical History  Diagnosis Date  . Asthma   . GERD (gastroesophageal reflux disease)   . Hypertension     only to combat the effects of hctz    Past surgical history: Past Surgical History  Procedure Laterality Date  . Tonsillectomy   2003  . Dilation and curettage of uterus  12/2011    Family history: Family History  Problem Relation Age of Onset  . Cervical cancer Mother 69  . Hypertension Mother   . Food Allergy Mother   . Diabetes Maternal Grandfather     Social history: Social History   Social History  . Marital Status: Married    Spouse Name: N/A  . Number of Children: N/A  . Years of Education: N/A   Occupational History  . Not on file.   Social History Main Topics  . Smoking status: Never Smoker   . Smokeless tobacco: Never Used  . Alcohol Use: No  . Drug Use: No  . Sexual Activity:    Partners: Male    Birth Control/ Protection: None   Other Topics Concern  . Not on file   Social History Narrative   Environmental History: The patient lives in a 75-year-old house with carpeting throughout and central air/heat.  She is a nonsmoker without pets.    Medication List       This list is accurate as of: 11/14/15  5:21 PM.  Always use your most recent med list.               albuterol 108 (90 Base) MCG/ACT inhaler  Commonly known as:  VENTOLIN HFA  INHALE 2 PUFFS INTO THE LUNGS EVERY 4 HOURS AS NEEDED FOR WHEEZING.     AMBULATORY NON FORMULARY MEDICATION  Nebulizer with necessary accessories.  Dx: Moderate Persistent Asthma     budesonide 32 MCG/ACT nasal spray  Commonly known as:  RHINOCORT AQUA  Place 2 sprays into both nostrils daily.     cetirizine HCl 5 MG/5ML Syrp  Commonly known as:  ZYRTEC CHILDRENS ALLERGY  Take 5 mLs (5 mg total) by mouth daily.     cromolyn 20 MG/2ML nebulizer solution  Commonly known as:  INTAL  Inhale 20mg  four times a day, may decrease to twice a day once symptoms improve.     EPINEPHrine 0.3 mg/0.3 mL Soaj injection  Commonly known as:  EPI-PEN  Inject 0.3 mLs (0.3 mg total) into the muscle once.     Fluticasone-Salmeterol 500-50 MCG/DOSE Aepb  Commonly known as:  ADVAIR DISKUS  Inhale 1 puff into the lungs 2 (two) times daily.      hydrochlorothiazide 25 MG tablet  Commonly known as:  HYDRODIURIL  Take 1 tablet (25 mg total) by mouth daily.     loratadine 10 MG tablet  Commonly known as:  CLARITIN  Take 1 tablet (10 mg total) by mouth daily.     montelukast 10 MG tablet  Commonly known as:  SINGULAIR  Take 1 tablet (10 mg total) by mouth at bedtime.     pseudoephedrine 30 MG tablet  Commonly known as:  SUDAFED  Take 1 tablet (30 mg total) by mouth every 4 (four) hours as needed for congestion.     ranitidine 150 MG tablet  Commonly known as:  ZANTAC  Take 150 mg by mouth daily.        Known medication allergies: Allergies  Allergen Reactions  . Compazine [Prochlorperazine Edisylate]     seizure  . Other     Tree nuts, melon, gluten intolerance, eggs  . Peanut-Containing Drug Products   . Ppd [Tuberculin Purified Protein Derivative]     Allergic reaction  . Latex Hives and Rash    I appreciate the opportunity to  take part in this Aino's care. Please do not hesitate to contact me with questions.  Sincerely,   R. Jorene Guest, MD

## 2015-11-19 ENCOUNTER — Encounter: Payer: Self-pay | Admitting: *Deleted

## 2015-11-19 NOTE — Telephone Encounter (Signed)
Will you please let patient know that we have two different models of portable nebulizers in stock now if she would like to schedule a nurse visit to chose one.

## 2015-11-19 NOTE — Telephone Encounter (Signed)
Vm left for pt.

## 2015-11-21 ENCOUNTER — Ambulatory Visit: Payer: Federal, State, Local not specified - PPO | Admitting: Allergy and Immunology

## 2015-11-29 ENCOUNTER — Other Ambulatory Visit (INDEPENDENT_AMBULATORY_CARE_PROVIDER_SITE_OTHER): Payer: Federal, State, Local not specified - PPO

## 2015-11-29 DIAGNOSIS — O039 Complete or unspecified spontaneous abortion without complication: Secondary | ICD-10-CM

## 2015-11-30 ENCOUNTER — Telehealth: Payer: Self-pay | Admitting: *Deleted

## 2015-11-30 LAB — HCG, QUANTITATIVE, PREGNANCY: hCG, Beta Chain, Quant, S: 2.9 m[IU]/mL

## 2015-11-30 NOTE — Telephone Encounter (Signed)
Pt notified of neg HCG and no further f/u HCG's are needed.

## 2015-12-12 ENCOUNTER — Encounter: Payer: Self-pay | Admitting: Obstetrics & Gynecology

## 2015-12-12 ENCOUNTER — Ambulatory Visit (INDEPENDENT_AMBULATORY_CARE_PROVIDER_SITE_OTHER): Payer: Federal, State, Local not specified - PPO | Admitting: Obstetrics & Gynecology

## 2015-12-12 VITALS — BP 108/73 | HR 80 | Ht 66.0 in | Wt 145.0 lb

## 2015-12-12 DIAGNOSIS — N938 Other specified abnormal uterine and vaginal bleeding: Secondary | ICD-10-CM | POA: Diagnosis not present

## 2015-12-12 NOTE — Progress Notes (Signed)
   Subjective:    Patient ID: Toni Harmon, female    DOB: August 13, 1976, 40 y.o.   MRN: [id number]  HPI  40 yo M G6P0 here to discuss constant daily bleeding since 11/20/15. She had another miscarriage 12/16. She has some left pelvic cramping and some LBP.   Review of Systems     Objective:   Physical Exam  Thin WHNADBF Abd- benign Cervix- old brown blood at os, no polyps or lesions 6 week size mid plane, NT, mobile uterus and no palpable adnexal masses      Assessment & Plan:  Dub-check cbc She will be calling Dr. April MansonYalcinkaya, so I will let him do her gyn u/s

## 2015-12-13 ENCOUNTER — Telehealth: Payer: Self-pay | Admitting: *Deleted

## 2015-12-13 LAB — CBC
HCT: 38.9 % (ref 36.0–46.0)
HEMOGLOBIN: 12.3 g/dL (ref 12.0–15.0)
MCH: 26.9 pg (ref 26.0–34.0)
MCHC: 31.6 g/dL (ref 30.0–36.0)
MCV: 84.9 fL (ref 78.0–100.0)
MPV: 8.9 fL (ref 8.6–12.4)
Platelets: 342 10*3/uL (ref 150–400)
RBC: 4.58 MIL/uL (ref 3.87–5.11)
RDW: 14.4 % (ref 11.5–15.5)
WBC: 5 10*3/uL (ref 4.0–10.5)

## 2015-12-13 LAB — TSH: TSH: 2.9 m[IU]/L

## 2015-12-13 LAB — HCG, QUANTITATIVE, PREGNANCY: hCG, Beta Chain, Quant, S: <2 m[IU]/mL

## 2015-12-13 NOTE — Telephone Encounter (Signed)
Pt notified of normal labs

## 2016-02-02 ENCOUNTER — Other Ambulatory Visit: Payer: Self-pay | Admitting: Family Medicine

## 2016-02-06 ENCOUNTER — Encounter: Payer: Self-pay | Admitting: Family Medicine

## 2016-02-06 ENCOUNTER — Ambulatory Visit (INDEPENDENT_AMBULATORY_CARE_PROVIDER_SITE_OTHER): Payer: Federal, State, Local not specified - PPO | Admitting: Family Medicine

## 2016-02-06 VITALS — BP 104/70 | HR 74 | Wt 140.0 lb

## 2016-02-06 DIAGNOSIS — B351 Tinea unguium: Secondary | ICD-10-CM | POA: Diagnosis not present

## 2016-02-06 DIAGNOSIS — I10 Essential (primary) hypertension: Secondary | ICD-10-CM | POA: Diagnosis not present

## 2016-02-06 DIAGNOSIS — J4540 Moderate persistent asthma, uncomplicated: Secondary | ICD-10-CM | POA: Diagnosis not present

## 2016-02-06 MED ORDER — EPINEPHRINE 0.3 MG/0.3ML IJ SOAJ: 0.3000 mg | Freq: Once | INTRAMUSCULAR | Status: DC

## 2016-02-06 MED ORDER — HYDROCHLOROTHIAZIDE 25 MG PO TABS: 25.0000 mg | ORAL_TABLET | Freq: Every day | ORAL | Status: DC

## 2016-02-06 MED ORDER — MONTELUKAST SODIUM 10 MG PO TABS: ORAL_TABLET | ORAL | Status: DC

## 2016-02-06 NOTE — Progress Notes (Signed)
CC: Toni KelchLannette Harmon is a 40 y.o. female is here for No chief complaint on file.   Subjective: HPI:  Follow-up essential hypertension: Taking hydrochlorothiazide on a daily basis. No outside blood pressures report. No chest pain, peripheral edema nor motor or sensory disturbances.  Follow-up asthma: Continues to take Singulair, Claritin, Advair, and Zyrtec. She never started cromolyn because she would like to run this recommendation by an allergist before she starts using it. She continues to use albuterol on a daily basis. Symptoms of asthma include cough and wheezing with occasional shortness of breath. Symptoms are worse when pollen counts are high. She was unable to go through a skin test for allergens due to fear of coming off of all of her antihistamines. She is currently a second opinion from a different allergist in hopes of getting blood work instead of the skin test. Denies fevers, chills or skin complaints.  Complains of toenail fungus involving the right great toe. She wants to see if I can refer her to a civilian podiatrist. She's tried multiple topical therapies that have failed.   Review Of Systems Outlined In HPI  Past Medical History  Diagnosis Date  . Asthma   . GERD (gastroesophageal reflux disease)   . Hypertension     only to combat the effects of hctz    Past Surgical History  Procedure Laterality Date  . Tonsillectomy  2003  . Dilation and curettage of uterus  12/2011   Family History  Problem Relation Age of Onset  . Cervical cancer Mother [id number]  . Hypertension Mother   . Food Allergy Mother   . Diabetes Maternal Grandfather     Social History   Social History  . Marital Status: Married    Spouse Name: N/A  . Number of Children: N/A  . Years of Education: N/A   Occupational History  . Not on file.   Social History Main Topics  . Smoking status: Never Smoker   . Smokeless tobacco: Never Used  . Alcohol Use: No  . Drug Use: No  . Sexual Activity:    Partners: Male    Birth Control/ Protection: None   Other Topics Concern  . Not on file   Social History Narrative     Objective: BP 104/70 mmHg  Pulse 74  Wt 140 lb (63.504 kg)  LMP 07/27/2015  General: Alert and Oriented, No Acute Distress HEENT: Pupils equal, round, reactive to light. Conjunctivae clear.  Moist mucous membranes Lungs: Clear to auscultation bilaterally, no wheezing/ronchi/rales.  Comfortable work of breathing. Good air movement. Cardiac: Regular rate and rhythm. Normal S1/S2.  No murmurs, rubs, nor gallops.   Extremities: No peripheral edema.  Strong peripheral pulses.  Mental Status: No depression, anxiety, nor agitation. Skin: Warm and dry.  Assessment & Plan: Diagnoses and all orders for this visit:  Essential hypertension, benign -     hydrochlorothiazide (HYDRODIURIL) 25 MG tablet; Take 1 tablet (25 mg total) by mouth daily.  Fungal infection of nail -     Ambulatory referral to Podiatry  Moderate persistent asthma, uncomplicated  Other orders -     EPINEPHrine 0.3 mg/0.3 mL IJ SOAJ injection; Inject 0.3 mLs (0.3 mg total) into the muscle once. -     montelukast (SINGULAIR) 10 MG tablet; TAKE 1 TABLET (10 MG TOTAL) BY MOUTH AT BEDTIME.   Essential hypertension: Controlled continue hydrochlorothiazide Referral to podiatry seems reasonable Moderate persistent asthma currently uncontrolled requiring albuterol daily. Encourage her to consider starting cromylin has already  been sent to her pharmacy.  Return in about 3 months (around 05/08/2016), or if symptoms worsen or fail to improve.

## 2016-02-20 ENCOUNTER — Ambulatory Visit (INDEPENDENT_AMBULATORY_CARE_PROVIDER_SITE_OTHER): Payer: Federal, State, Local not specified - PPO | Admitting: Podiatry

## 2016-02-20 ENCOUNTER — Encounter: Payer: Self-pay | Admitting: Podiatry

## 2016-02-20 VITALS — BP 112/76 | HR 66 | Resp 18

## 2016-02-20 DIAGNOSIS — B351 Tinea unguium: Secondary | ICD-10-CM

## 2016-02-20 DIAGNOSIS — L603 Nail dystrophy: Secondary | ICD-10-CM

## 2016-02-20 NOTE — Patient Instructions (Signed)
Onychomycosis/Fungal Toenails  WHAT IS IT? An infection that lies within the keratin of your nail plate that is caused by a fungus.  WHY ME? Fungal infections affect all ages, sexes, races, and creeds.  There may be many factors that predispose you to a fungal infection such as age, coexisting medical conditions such as diabetes, or an autoimmune disease; stress, medications, fatigue, genetics, etc.  Bottom line: fungus thrives in a warm, moist environment and your shoes offer such a location.  IS IT CONTAGIOUS? Theoretically, yes.  You do not want to share shoes, nail clippers or files with someone who has fungal toenails.  Walking around barefoot in the same room or sleeping in the same bed is unlikely to transfer the organism.  It is important to realize, however, that fungus can spread easily from one nail to the next on the same foot.  HOW DO WE TREAT THIS?  There are several ways to treat this condition.  Treatment may depend on many factors such as age, medications, pregnancy, liver and kidney conditions, etc.  It is best to ask your doctor which options are available to you.  1. No treatment.   Unlike many other medical concerns, you can live with this condition.  However for many people this can be a painful condition and may lead to ingrown toenails or a bacterial infection.  It is recommended that you keep the nails cut short to help reduce the amount of fungal nail. 2. Topical treatment.  These range from herbal remedies to prescription strength nail lacquers.  About 40-50% effective, topicals require twice daily application for approximately 9 to 12 months or until an entirely new nail has grown out.  The most effective topicals are medical grade medications available through physicians offices. 3. Oral antifungal medications.  With an 80-90% cure rate, the most common oral medication requires 3 to 4 months of therapy and stays in your system for a year as the new nail grows out.  Oral  antifungal medications do require blood work to make sure it is a safe drug for you.  A liver function panel will be performed prior to starting the medication and after the first month of treatment.  It is important to have the blood work performed to avoid any harmful side effects.  In general, this medication safe but blood work is required. 4. Laser Therapy.  This treatment is performed by applying a specialized laser to the affected nail plate.  This therapy is noninvasive, fast, and non-painful.  It is not covered by insurance and is therefore, out of pocket.  The results have been very good with a 80-95% cure rate.  The [facility] is the only practice in the area to offer this therapy. 5. Permanent Nail Avulsion.  Removing the entire nail so that a new nail will not grow back. Terbinafine oral granules What is this medicine? TERBINAFINE (TER bin a feen) is an antifungal medicine. It is used to treat certain kinds of fungal or yeast infections. This medicine may be used for other purposes; ask your health care provider or pharmacist if you have questions. What should I tell my health care provider before I take this medicine? They need to know if you have any of these conditions: -drink alcoholic beverages -kidney disease -liver disease -an unusual or allergic reaction to Terbinafine, other medicines, foods, dyes, or preservatives -pregnant or trying to get pregnant -breast-feeding How should I use this medicine? Take this medicine by mouth. Follow  the directions on the prescription label. Hold packet with cut line on top. Shake packet gently to settle contents. Tear packet open along cut line, or use scissors to cut across line. Carefully pour the entire contents of packet onto a spoonful of a soft food, such as pudding or other soft, non-acidic food such as mashed potatoes (do NOT use applesauce or a fruit-based food). If two packets are required for each dose, you may either sprinkle  the content of both packets on one spoonful of non-acidic food, or sprinkle the contents of both packets on two spoonfuls of non-acidic food. Make sure that no granules remain in the packet. Swallow the mxiture of the food and granules without chewing. Take your medicine at regular intervals. Do not take it more often than directed. Take all of your medicine as directed even if you think you are better. Do not skip doses or stop your medicine early. Contact your pediatrician or health care professional regarding the use of this medicine in children. While this medicine may be prescribed for children as young as 4 years for selected conditions, precautions do apply. Overdosage: If you think you have taken too much of this medicine contact a poison control center or emergency room at once. NOTE: This medicine is only for you. Do not share this medicine with others. What if I miss a dose? If you miss a dose, take it as soon as you can. If it is almost time for your next dose, take only that dose. Do not take double or extra doses. What may interact with this medicine? Do not take this medicine with any of the following medications: -thioridazine This medicine may also interact with the following medications: -beta-blockers -caffeine -cimetidine -cyclosporine -MAOIs like Carbex, Eldepryl, Marplan, Nardil, and Parnate -medicines for fungal infections like fluconazole and ketoconazole -medicines for irregular heartbeat like amiodarone, flecainide and propafenone -rifampin -SSRIs like citalopram, escitalopram, fluoxetine, fluvoxamine, paroxetine and sertraline -tricyclic antidepressants like amitriptyline, clomipramine, desipramine, imipramine, nortriptyline, and others -warfarin This list may not describe all possible interactions. Give your health care provider a list of all the medicines, herbs, non-prescription drugs, or dietary supplements you use. Also tell them if you smoke, drink alcohol, or  use illegal drugs. Some items may interact with your medicine. What should I watch for while using this medicine? Your doctor may monitor your liver function. Tell your doctor right away if you have nausea or vomiting, loss of appetite, stomach pain on your right upper side, yellow skin, dark urine, light stools, or are over tired. You need to take this medicine for 6 weeks or longer to cure the fungal infection. Take your medicine regularly for as long as your doctor or health care professional tells you to. What side effects may I notice from receiving this medicine? Side effects that you should report to your doctor or health care professional as soon as possible: -allergic reactions like skin rash or hives, swelling of the face, lips, or tongue -change in vision -dark urine -fever or infection -general ill feeling or flu-like symptoms -light-colored stools -loss of appetite, nausea -redness, blistering, peeling or loosening of the skin, including inside the mouth -right upper belly pain -unusually weak or tired -yellowing of the eyes or skin Side effects that usually do not require medical attention (report to your doctor or health care professional if they continue or are bothersome): -changes in taste -diarrhea -hair loss -muscle or joint pain -stomach upset This list may not describe all possible  side effects. Call your doctor for medical advice about side effects. You may report side effects to FDA at 1-800-FDA-1088. Where should I keep my medicine? Keep out of the reach of children. Store at room temperature between 15 and 30 degrees C (59 and 86 degrees F). Throw away any unused medicine after the expiration date. NOTE: This sheet is a summary. It may not cover all possible information. If you have questions about this medicine, talk to your doctor, pharmacist, or health care provider.    2016, Elsevier/Gold Standard. (2007-12-03 17:25:48)

## 2016-02-20 NOTE — Progress Notes (Signed)
   Subjective:    Patient ID: Toni Harmon, female    DOB: [birth year], 40 y.o.   MRN: [id number]  HPI  40 year old female presents the office today for concerns of toenail fungus to both of her feet. She states the nails only become painful in the digit elongated and the get ingrown but she denies his pain this time. Denies any redness or drainage. [facility] and she has been applying which she thinks is niacin cream over on the toenails this has not been helping. She was soaking a peroxide this may help some. No other complaints.   Review of Systems  All other systems reviewed and are negative.      Objective:   Physical Exam General: AAO x3, NAD  Dermatological: Nails appear to be hypertrophic, dystrophic, brittle, discolored, elongated 10. There is no swelling redness or drainage. There is no tenderness the nails at this time. No open lesions present.  Vascular: Dorsalis Pedis artery and Posterior Tibial artery pedal pulses are 2/4 bilateral with immedate capillary fill time. Pedal hair growth present. No varicosities and no lower extremity edema present bilateral. There is no pain with calf compression, swelling, warmth, erythema.   Neruologic: Grossly intact via light touch bilateral. Vibratory intact via tuning fork bilateral. Protective threshold with Semmes Wienstein monofilament intact to all pedal sites bilateral. Patellar and Achilles deep tendon reflexes 2+ bilateral.   Musculoskeletal: No gross boney pedal deformities bilateral. No pain, crepitus, or limitation noted with foot and ankle range of motion bilateral. Muscular strength 5/5 in all groups tested bilateral.  Gait: Unassisted, Nonantalgic.      Assessment & Plan:  40 year old female with onychodystrophy likely onychomycosis -Treatment options discussed including all alternatives, risks, and complications -Etiology of symptoms were discussed -[facility] -Discussed treatment options for onychomycosis but we will await the results of the biopsy before proceeding with treatment. -Follow-up in 4 weeks or sooner if any problems arise. In the meantime, encouraged to call the office with any questions, concerns, change in symptoms.   Ovid CurdMatthew Wagoner, DPM

## 2016-02-22 NOTE — Addendum Note (Signed)
Addended by: Hadley PenOX, Kimmi Acocella R on: 02/22/2016 11:56 AM   Modules accepted: Orders

## 2016-03-14 ENCOUNTER — Ambulatory Visit (INDEPENDENT_AMBULATORY_CARE_PROVIDER_SITE_OTHER): Payer: Federal, State, Local not specified - PPO | Admitting: Podiatry

## 2016-03-14 ENCOUNTER — Encounter: Payer: Self-pay | Admitting: Podiatry

## 2016-03-14 ENCOUNTER — Other Ambulatory Visit: Payer: Self-pay

## 2016-03-14 DIAGNOSIS — B351 Tinea unguium: Secondary | ICD-10-CM

## 2016-03-14 MED ORDER — MONTELUKAST SODIUM 10 MG PO TABS: 10.0000 mg | ORAL_TABLET | Freq: Every day | ORAL | Status: DC

## 2016-03-14 MED ORDER — TERBINAFINE HCL 250 MG PO TABS: 250.0000 mg | ORAL_TABLET | Freq: Every day | ORAL | Status: DC

## 2016-03-14 NOTE — Patient Instructions (Addendum)
We started lasering of the toenails today Get blood work done Take lamisil 1 pill a day for 7 days straight each month for 4 months. We will likely do laser for 4-6 treatments.    Terbinafine oral granules What is this medicine? TERBINAFINE (TER bin a feen) is an antifungal medicine. It is used to treat certain kinds of fungal or yeast infections. This medicine may be used for other purposes; ask your health care provider or pharmacist if you have questions. What should I tell my health care provider before I take this medicine? They need to know if you have any of these conditions: -drink alcoholic beverages -kidney disease -liver disease -an unusual or allergic reaction to Terbinafine, other medicines, foods, dyes, or preservatives -pregnant or trying to get pregnant -breast-feeding How should I use this medicine? Take this medicine by mouth. Follow the directions on the prescription label. Hold packet with cut line on top. Shake packet gently to settle contents. Tear packet open along cut line, or use scissors to cut across line. Carefully pour the entire contents of packet onto a spoonful of a soft food, such as pudding or other soft, non-acidic food such as mashed potatoes (do NOT use applesauce or a fruit-based food). If two packets are required for each dose, you may either sprinkle the content of both packets on one spoonful of non-acidic food, or sprinkle the contents of both packets on two spoonfuls of non-acidic food. Make sure that no granules remain in the packet. Swallow the mxiture of the food and granules without chewing. Take your medicine at regular intervals. Do not take it more often than directed. Take all of your medicine as directed even if you think you are better. Do not skip doses or stop your medicine early. Contact your pediatrician or health care professional regarding the use of this medicine in children. While this medicine may be prescribed for children as young as  4 years for selected conditions, precautions do apply. Overdosage: If you think you have taken too much of this medicine contact a poison control center or emergency room at once. NOTE: This medicine is only for you. Do not share this medicine with others. What if I miss a dose? If you miss a dose, take it as soon as you can. If it is almost time for your next dose, take only that dose. Do not take double or extra doses. What may interact with this medicine? Do not take this medicine with any of the following medications: -thioridazine This medicine may also interact with the following medications: -beta-blockers -caffeine -cimetidine -cyclosporine -MAOIs like Carbex, Eldepryl, Marplan, Nardil, and Parnate -medicines for fungal infections like fluconazole and ketoconazole -medicines for irregular heartbeat like amiodarone, flecainide and propafenone -rifampin -SSRIs like citalopram, escitalopram, fluoxetine, fluvoxamine, paroxetine and sertraline -tricyclic antidepressants like amitriptyline, clomipramine, desipramine, imipramine, nortriptyline, and others -warfarin This list may not describe all possible interactions. Give your health care provider a list of all the medicines, herbs, non-prescription drugs, or dietary supplements you use. Also tell them if you smoke, drink alcohol, or use illegal drugs. Some items may interact with your medicine. What should I watch for while using this medicine? Your doctor may monitor your liver function. Tell your doctor right away if you have nausea or vomiting, loss of appetite, stomach pain on your right upper side, yellow skin, dark urine, light stools, or are over tired. You need to take this medicine for 6 weeks or longer to cure the fungal infection.  Take your medicine regularly for as long as your doctor or health care professional tells you to. What side effects may I notice from receiving this medicine? Side effects that you should report to  your doctor or health care professional as soon as possible: -allergic reactions like skin rash or hives, swelling of the face, lips, or tongue -change in vision -dark urine -fever or infection -general ill feeling or flu-like symptoms -light-colored stools -loss of appetite, nausea -redness, blistering, peeling or loosening of the skin, including inside the mouth -right upper belly pain -unusually weak or tired -yellowing of the eyes or skin Side effects that usually do not require medical attention (report to your doctor or health care professional if they continue or are bothersome): -changes in taste -diarrhea -hair loss -muscle or joint pain -stomach upset This list may not describe all possible side effects. Call your doctor for medical advice about side effects. You may report side effects to FDA at 1-800-FDA-1088. Where should I keep my medicine? Keep out of the reach of children. Store at room temperature between 15 and 30 degrees C (59 and 86 degrees F). Throw away any unused medicine after the expiration date. NOTE: This sheet is a summary. It may not cover all possible information. If you have questions about this medicine, talk to your doctor, pharmacist, or health care provider.    2016, Elsevier/Gold Standard. (2007-12-03 17:25:48)

## 2016-03-16 ENCOUNTER — Emergency Department (HOSPITAL_BASED_OUTPATIENT_CLINIC_OR_DEPARTMENT_OTHER)
Admission: EM | Admit: 2016-03-16 | Discharge: 2016-03-16 | Disposition: A | Discharge status: Home / Self Care | Payer: Federal, State, Local not specified - PPO | Attending: Emergency Medicine | Admitting: Emergency Medicine

## 2016-03-16 ENCOUNTER — Encounter (HOSPITAL_BASED_OUTPATIENT_CLINIC_OR_DEPARTMENT_OTHER): Payer: Self-pay | Admitting: *Deleted

## 2016-03-16 ENCOUNTER — Emergency Department (HOSPITAL_BASED_OUTPATIENT_CLINIC_OR_DEPARTMENT_OTHER): Payer: Federal, State, Local not specified - PPO

## 2016-03-16 DIAGNOSIS — I10 Essential (primary) hypertension: Secondary | ICD-10-CM | POA: Diagnosis not present

## 2016-03-16 DIAGNOSIS — S39012A Strain of muscle, fascia and tendon of lower back, initial encounter: Secondary | ICD-10-CM

## 2016-03-16 DIAGNOSIS — J45909 Unspecified asthma, uncomplicated: Secondary | ICD-10-CM | POA: Diagnosis not present

## 2016-03-16 DIAGNOSIS — Y939 Activity, unspecified: Secondary | ICD-10-CM | POA: Diagnosis not present

## 2016-03-16 DIAGNOSIS — Y999 Unspecified external cause status: Secondary | ICD-10-CM | POA: Diagnosis not present

## 2016-03-16 DIAGNOSIS — S3992XA Unspecified injury of lower back, initial encounter: Secondary | ICD-10-CM | POA: Diagnosis present

## 2016-03-16 DIAGNOSIS — Y929 Unspecified place or not applicable: Secondary | ICD-10-CM | POA: Insufficient documentation

## 2016-03-16 DIAGNOSIS — X58XXXA Exposure to other specified factors, initial encounter: Secondary | ICD-10-CM | POA: Insufficient documentation

## 2016-03-16 LAB — CBC WITH DIFFERENTIAL/PLATELET
Basophils Absolute: 0 10*3/uL (ref 0.0–0.1)
Basophils Relative: 1 %
Eosinophils Absolute: 0.1 10*3/uL (ref 0.0–0.7)
Eosinophils Relative: 2 %
HEMATOCRIT: 37.2 % (ref 36.0–46.0)
HEMOGLOBIN: 12.7 g/dL (ref 12.0–15.0)
LYMPHS ABS: 2.3 10*3/uL (ref 0.7–4.0)
LYMPHS PCT: 55 %
MCH: 28.4 pg (ref 26.0–34.0)
MCHC: 34.1 g/dL (ref 30.0–36.0)
MCV: 83.2 fL (ref 78.0–100.0)
MONOS PCT: 8 %
Monocytes Absolute: 0.3 10*3/uL (ref 0.1–1.0)
NEUTROS PCT: 34 %
Neutro Abs: 1.4 10*3/uL — ABNORMAL LOW (ref 1.7–7.7)
Platelets: 404 10*3/uL — ABNORMAL HIGH (ref 150–400)
RBC: 4.47 MIL/uL (ref 3.87–5.11)
RDW: 13.8 % (ref 11.5–15.5)
WBC: 4.1 10*3/uL (ref 4.0–10.5)

## 2016-03-16 LAB — COMPREHENSIVE METABOLIC PANEL WITH GFR
ALT: 15 U/L (ref 14–54)
AST: 15 U/L (ref 15–41)
Albumin: 4.4 g/dL (ref 3.5–5.0)
Alkaline Phosphatase: 45 U/L (ref 38–126)
Anion gap: 8 (ref 5–15)
BUN: 17 mg/dL (ref 6–20)
CO2: 24 mmol/L (ref 22–32)
Calcium: 9.6 mg/dL (ref 8.9–10.3)
Chloride: 105 mmol/L (ref 101–111)
Creatinine, Ser: 0.76 mg/dL (ref 0.44–1.00)
GFR calc Af Amer: >60 mL/min
GFR calc non Af Amer: >60 mL/min
Glucose, Bld: 73 mg/dL (ref 65–99)
Potassium: 4 mmol/L (ref 3.5–5.1)
Sodium: 137 mmol/L (ref 135–145)
Total Bilirubin: 0.4 mg/dL (ref 0.3–1.2)
Total Protein: 7 g/dL (ref 6.5–8.1)

## 2016-03-16 LAB — URINALYSIS, ROUTINE W REFLEX MICROSCOPIC
Bilirubin Urine: NEGATIVE
GLUCOSE, UA: NEGATIVE mg/dL
Ketones, ur: NEGATIVE mg/dL
LEUKOCYTES UA: NEGATIVE
Nitrite: NEGATIVE
PH: 5.5 (ref 5.0–8.0)
Protein, ur: NEGATIVE mg/dL
Specific Gravity, Urine: 1.007 (ref 1.005–1.030)

## 2016-03-16 LAB — URINE MICROSCOPIC-ADD ON: WBC UA: NONE SEEN WBC/hpf (ref 0–5)

## 2016-03-16 LAB — PREGNANCY, URINE: Preg Test, Ur: NEGATIVE

## 2016-03-16 MED ORDER — SODIUM CHLORIDE 0.9 % IV BOLUS (SEPSIS)
1000.0000 mL | Freq: Once | INTRAVENOUS | Status: AC
  Administered 2016-03-16: 1000 mL via INTRAVENOUS

## 2016-03-16 MED ORDER — METHOCARBAMOL 500 MG PO TABS
500.0000 mg | ORAL_TABLET | Freq: Four times a day (QID) | ORAL | Status: DC
Start: 2016-03-16 — End: 2016-09-02

## 2016-03-16 MED ORDER — KETOROLAC TROMETHAMINE 15 MG/ML IJ SOLN
15.0000 mg | Freq: Once | INTRAMUSCULAR | Status: AC
  Administered 2016-03-16: 15 mg via INTRAVENOUS
  Filled 2016-03-16: qty 1

## 2016-03-16 NOTE — ED Provider Notes (Signed)
CSN: [id number]     Arrival date & time 03/16/16  1606 History  By signing my name below, I, Soijett Blue, attest that this documentation has been prepared under the direction and in the presence of Tilden Fossa, MD. Electronically Signed: Soijett Blue, ED Scribe. 03/16/2016. 5:44 PM.   Chief Complaint  Patient presents with  . Back Pain       The history is provided by the patient. No language interpreter was used.    HPI Comments: Toni Harmon is a 40 y.o. female who presents to the Emergency Department complaining of constant, cramping, right lower back pain onset last night worsening today. Pt notes that she is currently on her period. Pt states that her right lower back pain is worsened with drinking water and radiates to groin area. Denies any alleviating factors. Denies any recent injury/trauma. She states that she has tried heating pad with no relief for her symptoms. Pt denies vomiting, dysuria, fever, diarrhea, abdominal pain, leg pain, leg weakness, and any other symptoms. Denies medical issues. Pt is allergic to compazine. Pt is an Lexicographer with mild strenuous activity. Denies abdominal surgeries.   Past Medical History  Diagnosis Date  . Asthma   . GERD (gastroesophageal reflux disease)   . Hypertension     only to combat the effects of hctz   Past Surgical History  Procedure Laterality Date  . Tonsillectomy  2003  . Dilation and curettage of uterus  12/2011   Family History  Problem Relation Age of Onset  . Cervical cancer Mother 76  . Hypertension Mother   . Food Allergy Mother   . Diabetes Maternal Grandfather    Social History  Substance Use Topics  . Smoking status: Never Smoker   . Smokeless tobacco: Never Used  . Alcohol Use: No   OB History    Gravida Para Term Preterm AB TAB SAB Ectopic Multiple Living   5    4 1 3    0     Review of Systems  Constitutional: Negative for fever.  Gastrointestinal: Negative for vomiting, abdominal  pain and diarrhea.  Genitourinary: Negative for dysuria.  Musculoskeletal: Positive for back pain. Negative for arthralgias.  Neurological: Negative for weakness.      Allergies  Compazine; Other; Peanut-containing drug products; Ppd; and Latex  Home Medications   Prior to Admission medications   Medication Sig Start Date End Date Taking? Authorizing Provider  albuterol (VENTOLIN HFA) 108 (90 Base) MCG/ACT inhaler INHALE 2 PUFFS INTO THE LUNGS EVERY 4 HOURS AS NEEDED FOR WHEEZING. 11/08/15   Laren Boom, DO  AMBULATORY NON FORMULARY MEDICATION Nebulizer with necessary accessories.  Dx: Moderate Persistent Asthma 10/26/13   [name], DO  budesonide (RHINOCORT AQUA) 32 MCG/ACT nasal spray Place 2 sprays into both nostrils daily. 11/08/15   Laren Boom, DO  cetirizine HCl (ZYRTEC CHILDRENS ALLERGY) 5 MG/5ML SYRP Take 5 mLs (5 mg total) by mouth daily. 11/08/15   Laren Boom, DO  cromolyn (INTAL) 20 MG/2ML nebulizer solution Inhale 20mg  four times a day, may decrease to twice a day once symptoms improve. 11/08/15   [name], DO  EPINEPHrine 0.3 mg/0.3 mL IJ SOAJ injection Inject 0.3 mLs (0.3 mg total) into the muscle once. 02/06/16   [name], DO  Fluticasone-Salmeterol (ADVAIR DISKUS) 500-50 MCG/DOSE AEPB Inhale 1 puff into the lungs 2 (two) times daily. 11/08/15   Laren Boom, DO  hydrochlorothiazide (HYDRODIURIL) 25 MG tablet Take 1 tablet (25 mg total) by mouth daily. 02/06/16  [name], DO  loratadine (CLARITIN) 10 MG tablet Take 1 tablet (10 mg total) by mouth daily. 11/08/15   Laren BoomSean Hommel, DO  methocarbamol (ROBAXIN) 500 MG tablet Take 1 tablet (500 mg total) by mouth 4 (four) times daily. 03/16/16   Tilden FossaElizabeth Darwyn Ponzo, MD  montelukast (SINGULAIR) 10 MG tablet Take 1 tablet (10 mg total) by mouth at bedtime. 03/14/16   [name], DO  pseudoephedrine (SUDAFED) 30 MG tablet Take 1 tablet (30 mg total) by mouth every 4 (four) hours as needed for congestion. 11/08/15   [name], DO  ranitidine  (ZANTAC) 150 MG tablet Take 150 mg by mouth daily.    Historical Provider, MD  terbinafine (LAMISIL) 250 MG tablet Take 1 tablet (250 mg total) by mouth daily. 03/14/16   Vivi BarrackMatthew R Wagoner, DPM   BP 112/80 mmHg  Pulse 64  Temp(Src) 98.4 F (36.9 C) (Oral)  Resp 18  Ht 5\' 6"  (1.676 m)  Wt 132 lb (59.875 kg)  BMI 21.32 kg/m2  SpO2 100%  LMP 03/13/2016 Physical Exam  Constitutional: She is oriented to person, place, and time. She appears well-developed and well-nourished.  HENT:  Head: Normocephalic and atraumatic.  Cardiovascular: Normal rate and regular rhythm.   No murmur heard. Pulmonary/Chest: Effort normal and breath sounds normal. No respiratory distress.  Abdominal: Soft. There is no tenderness. There is no rebound and no guarding.  Musculoskeletal: She exhibits no edema or tenderness.  Mild right lower back TTP  Neurological: She is alert and oriented to person, place, and time.  5/5 strength in BLE  Skin: Skin is warm and dry.  Psychiatric: She has a normal mood and affect. Her behavior is normal.  Nursing note and vitals reviewed.   ED Course  Procedures (including critical care time) DIAGNOSTIC STUDIES: Oxygen Saturation is 100% on RA, nl by my interpretation.    COORDINATION OF CARE: 5:44 PM Discussed treatment plan with pt at bedside which includes UA, CT renal stone study, labs, and pt agreed to plan.    Labs Review Labs Reviewed  URINALYSIS, ROUTINE W REFLEX MICROSCOPIC (NOT [facility]) - Abnormal; Notable for the following:    Color, Urine AMBER (*)    Hgb urine dipstick SMALL (*)    All other components within normal limits  URINE MICROSCOPIC-ADD ON - Abnormal; Notable for the following:    Squamous Epithelial / LPF 0-5 (*)    Bacteria, UA RARE (*)    All other components within normal limits  CBC WITH DIFFERENTIAL/PLATELET - Abnormal; Notable for the following:    Platelets 404 (*)    Neutro Abs 1.4 (*)    All other components within normal limits   PREGNANCY, URINE  COMPREHENSIVE METABOLIC PANEL    Imaging Review Ct Renal Stone Study  03/16/2016  CLINICAL DATA:  Right flank pain EXAM: CT ABDOMEN AND PELVIS WITHOUT CONTRAST TECHNIQUE: Multidetector CT imaging of the abdomen and pelvis was performed following the standard protocol without IV contrast. COMPARISON:  None. FINDINGS: Lower chest:  Lung bases are clear. Hepatobiliary: Unenhanced liver is unremarkable. Layering gallbladder sludge (series 2/ image 25). No intrahepatic or extrahepatic ductal dilatation. Pancreas: Within normal limits. Spleen: Within normal limits. Adrenals/Urinary Tract: Adrenal glands are within normal limits. Kidneys are within normal limits. No renal, ureteral, or bladder calculi. No hydronephrosis. Bladder is within normal limits. Stomach/Bowel: Stomach is within normal limits. No evidence of bowel obstruction. Normal appendix (series 2/ image 42). Vascular/Lymphatic: No evidence of abdominal aortic aneurysm. No suspicious abdominopelvic  lymphadenopathy. Reproductive: Uterus is within normal limits. Bilateral ovaries are within normal limits. Other: No abdominopelvic ascites. Musculoskeletal: Mild degenerative changes of the lower lumbar spine. IMPRESSION: No evidence of bowel obstruction.  Normal appendix. No renal, ureteral, or bladder calculi.  No hydronephrosis. No CT findings to account for the patient's right flank pain. Electronically Signed   By: Charline Bills M.D.   On: 03/16/2016 18:14   I have personally reviewed and evaluated these images and lab results as part of my medical decision-making.   EKG Interpretation None      MDM   Final diagnoses:  Low back strain, initial encounter   Pt here for evaluation of acute low back pain.  NVI on exam.  Initial concern for potential renal colic.  CT stone study negative.  UA not c/w UTI  D/w pt likely MSK back pain.  Discussed home care with rest, NSAIDs, muscle relaxants, outpatient follow up, return  precautions.     I personally performed the services described in this documentation, which was scribed in my presence. The recorded information has been reviewed and is accurate.    Tilden Fossa, MD 03/17/16 1131

## 2016-03-16 NOTE — ED Notes (Signed)
Right flank pain x1 day

## 2016-03-16 NOTE — ED Notes (Addendum)
Pt c/o right flank pain onset last p.m. On day 3 of menstrual cycle. Denies urinary s/s. Pain increased with standing. Tried heat last night without relief. No hx of stone. Took Ibuprofen 800 mg PTA at 1530.

## 2016-03-16 NOTE — Discharge Instructions (Signed)

## 2016-03-17 LAB — CBC WITH DIFFERENTIAL/PLATELET
BASOS ABS: 43 {cells}/uL (ref 0–200)
BASOS PCT: 1 %
EOS ABS: 129 {cells}/uL (ref 15–500)
Eosinophils Relative: 3 %
HCT: 35.4 % (ref 35.0–45.0)
HEMOGLOBIN: 11.7 g/dL (ref 11.7–15.5)
LYMPHS ABS: 2236 {cells}/uL (ref 850–3900)
Lymphocytes Relative: 52 %
MCH: 27.5 pg (ref 27.0–33.0)
MCHC: 33.1 g/dL (ref 32.0–36.0)
MCV: 83.3 fL (ref 80.0–100.0)
MONO ABS: 344 {cells}/uL (ref 200–950)
MPV: 8.6 fL (ref 7.5–12.5)
Monocytes Relative: 8 %
NEUTROS ABS: 1548 {cells}/uL (ref 1500–7800)
Neutrophils Relative %: 36 %
Platelets: 373 10*3/uL (ref 140–400)
RBC: 4.25 MIL/uL (ref 3.80–5.10)
RDW: 14.7 % (ref 11.0–15.0)
WBC: 4.3 10*3/uL (ref 3.8–10.8)

## 2016-03-18 LAB — HEPATIC FUNCTION PANEL
ALBUMIN: 4 g/dL (ref 3.6–5.1)
ALK PHOS: 51 U/L (ref 33–115)
ALT: 12 U/L (ref 6–29)
AST: 11 U/L (ref 10–30)
BILIRUBIN INDIRECT: 0.2 mg/dL (ref 0.2–1.2)
Bilirubin, Direct: 0 mg/dL (ref <=0.2)
TOTAL PROTEIN: 6 g/dL — AB (ref 6.1–8.1)
Total Bilirubin: 0.2 mg/dL (ref 0.2–1.2)

## 2016-03-19 ENCOUNTER — Telehealth: Payer: Self-pay | Admitting: *Deleted

## 2016-03-19 DIAGNOSIS — B351 Tinea unguium: Secondary | ICD-10-CM | POA: Insufficient documentation

## 2016-03-19 NOTE — Progress Notes (Signed)
Patient ID: Toni Harmon, female   DOB: 02/14/1976, 40 y.o.   MRN: [id number]  Subjective: 40 year old female presents the office they discussed nail culture results. She states the nails continue to be thick and discolored. Denies any painful toenails no surrounding redness or drainage. Denies any systemic complaints such as fevers, chills, nausea, vomiting. No acute changes since last appointment, and no other complaints at this time.   Objective: AAO x3, NAD DP/PT pulses palpable bilaterally, CRT less than 3 seconds Nails are hypertrophic, dystrophic, brittle, discolored, elongated 10. No swelling redness or drainage. No tenderness the nails. No areas of pinpoint bony tenderness or pain with vibratory sensation. MMT 5/5, ROM WNL. No edema, erythema, increase in warmth to bilateral lower extremities.  No open lesions or pre-ulcerative lesions.  No pain with calf compression, swelling, warmth, erythema  Assessment: Onychomycosis  Plan: -All treatment options discussed with the patient including all alternatives, risks, complications.  -Discussed multiple treatment options. This time she is up to proceed with Lamisil, pulse dose, laser therapy. -Order blood work for Lamisil. Also ordered the medicine however does not start to call her with results. She will take Lamisil 7 days each month for 3 months. -She underwent her first course of laser therapy today without complications. -Laserering of Toenails was carried out at today's visit via Q-switch YAG laser by [employer] at continuous on the 10 digit toenails.  Patient and staff were wearing appropriate laser protective goggles/eyewear Laser device was tested prior to use and safety protocols were followed Frequency of 5 Hz, Level 4, Joules 1.3 delivered. The patient tolerated the lasering well without any complications. They were encouraged to call the office with any questions, concerns, change in symptoms. -F/U 1 month -Patient  encouraged to call the office with any questions, concerns, change in symptoms.   Ovid CurdMatthew Ervin Hensley, DPM

## 2016-03-19 NOTE — Telephone Encounter (Signed)
Dr. Ardelle AntonWagoner reviewed 03/17/2016 blood work and states may start Lamisil.  I informed pt of Dr. Gabriel RungWagoner's orders.

## 2016-03-21 ENCOUNTER — Encounter: Payer: Self-pay | Admitting: Family Medicine

## 2016-03-21 ENCOUNTER — Ambulatory Visit (INDEPENDENT_AMBULATORY_CARE_PROVIDER_SITE_OTHER): Payer: Federal, State, Local not specified - PPO | Admitting: Family Medicine

## 2016-03-21 VITALS — BP 100/68 | HR 73 | Wt 137.0 lb

## 2016-03-21 DIAGNOSIS — J4540 Moderate persistent asthma, uncomplicated: Secondary | ICD-10-CM

## 2016-03-21 DIAGNOSIS — M545 Low back pain, unspecified: Secondary | ICD-10-CM

## 2016-03-21 MED ORDER — MONTELUKAST SODIUM 5 MG PO CHEW: 10.0000 mg | CHEWABLE_TABLET | Freq: Every day | ORAL | Status: DC

## 2016-03-21 MED ORDER — DICLOFENAC SODIUM 75 MG PO TBEC: 75.0000 mg | DELAYED_RELEASE_TABLET | Freq: Two times a day (BID) | ORAL | Status: DC

## 2016-03-21 NOTE — Progress Notes (Signed)
CC: Toni Harmon is a 40 y.o. female is here for Hospitalization Follow-up   Subjective: HPI:  Earlier this week on the 11th she had a sudden onset of right lower back pain when trying to get out of her car. She was afraid she might have a kidney stone so she had her husband take her to a local emergency room where she had an unremarkable CBC, and a reassuring CT scan following a stone protocol. She's had significant improvement with a heating pad and also ibuprofen. She's been afraid to take Robaxin due to fear of it causing sedation. She wants no further something a little bit stronger than ibuprofen to act as an anti-inflammatory. She denies any worsening of her pain and states that overall it seems to be getting better. She denies any gastrointestinal complaints or genitourinary complaints.  She denies any overlying skin changes. Symptoms are not keeping her awake at night.  She wants to switch to brand name Synthroid due to fears that generic form is causing anxiety however she's having difficulty getting this filled at her pharmacy.   Review Of Systems Outlined In HPI  Past Medical History  Diagnosis Date  . Asthma   . GERD (gastroesophageal reflux disease)   . Hypertension     only to combat the effects of hctz    Past Surgical History  Procedure Laterality Date  . Tonsillectomy  2003  . Dilation and curettage of uterus  12/2011   Family History  Problem Relation Age of Onset  . Cervical cancer Mother 108  . Hypertension Mother   . Food Allergy Mother   . Diabetes Maternal Grandfather     Social History   Social History  . Marital Status: Married    Spouse Name: N/A  . Number of Children: N/A  . Years of Education: N/A   Occupational History  . Not on file.   Social History Main Topics  . Smoking status: Never Smoker   . Smokeless tobacco: Never Used  . Alcohol Use: No  . Drug Use: No  . Sexual Activity:    Partners: Male    Birth Control/ Protection: None    Other Topics Concern  . Not on file   Social History Narrative     Objective: BP 100/68 mmHg  Pulse 73  Wt 137 lb (62.143 kg)  LMP 03/13/2016  Vital signs reviewed. General: Alert and Oriented, No Acute Distress HEENT: Pupils equal, round, reactive to light. Conjunctivae clear.  External ears unremarkable.  Moist mucous membranes. Lungs: Clear and comfortable work of breathing, speaking in full sentences without accessory muscle use. Cardiac: Regular rate and rhythm.  Neuro: CN II-XII grossly intact, gait normal. Extremities: No peripheral edema.  Strong peripheral pulses.  Mental Status: No depression, anxiety, nor agitation. Logical though process. Back: Mild right lumbar paraspinal musculature tenderness and also mild tonicity. No midline spinous process tenderness to palpation. Gait normal Skin: Warm and dry.  Assessment & Plan: Toni Harmon was seen today for hospitalization follow-up.  Diagnoses and all orders for this visit:  Moderate persistent asthma, uncomplicated -     montelukast (SINGULAIR) 5 MG chewable tablet; Chew 2 tablets (10 mg total) by mouth at bedtime. Please dispense brand name Singulair.  Right-sided low back pain without sciatica -     diclofenac (VOLTAREN) 75 MG EC tablet; Take 1 tablet (75 mg total) by mouth 2 (two) times daily.   Discussed possibly switching from ibuprofen to diclofenac reassuring that her symptoms are slowly improving.  Asthma: We'll controlled however possible side effect from generic Singulair, switching to chewable formulation to see if insurance will cover this.  Return if symptoms worsen or fail to improve.

## 2016-03-23 IMAGING — US US OB COMP LESS 14 WK
1 series · 15 of 28 positions shown · non-contrast
Comparison: Ultrasound dated 03/20/2015.

CLINICAL DATA: 39-year-old female with bleeding since yesterday.
Pelvic cramping.

EXAM:
OBSTETRIC <14 WK US AND TRANSVAGINAL OB US
TECHNIQUE: Both transabdominal and transvaginal ultrasound examinations were
performed for complete evaluation of the gestation as well as the
maternal uterus, adnexal regions, and pelvic cul-de-sac.
Transvaginal technique was performed to assess early pregnancy.

[Series 1: us ob comp less 14 wk · 15 of 80 slices shown]
[im 1/80]
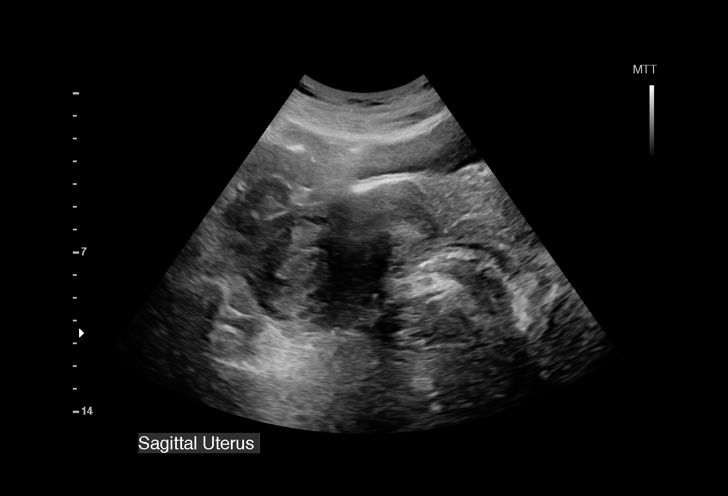
[im 6/80]
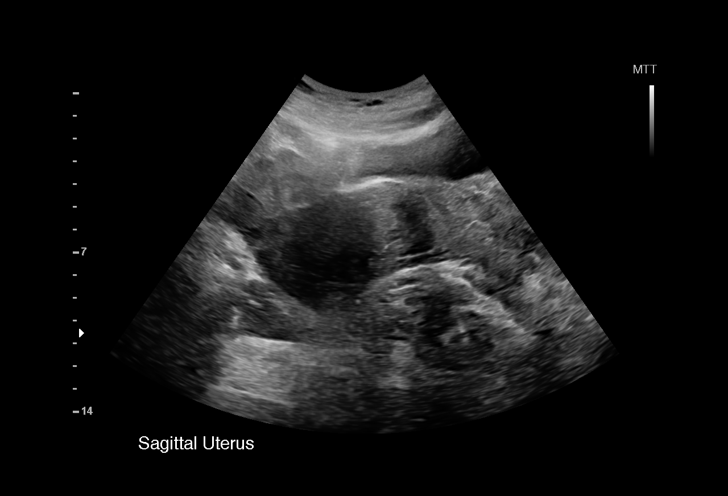
[im 12/80]
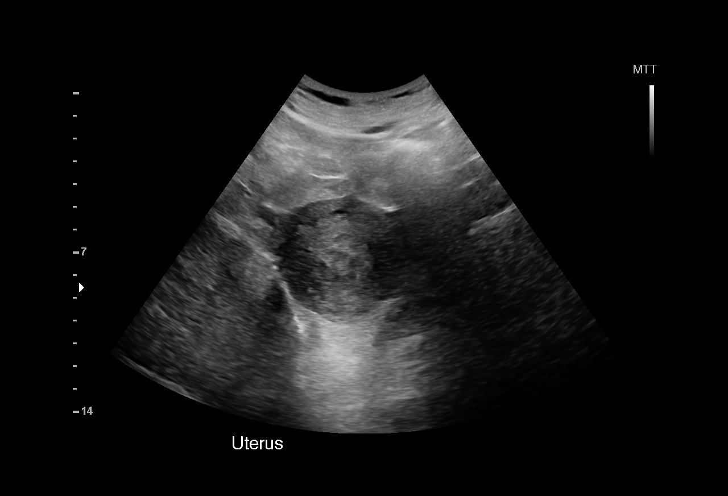
[im 18/80]
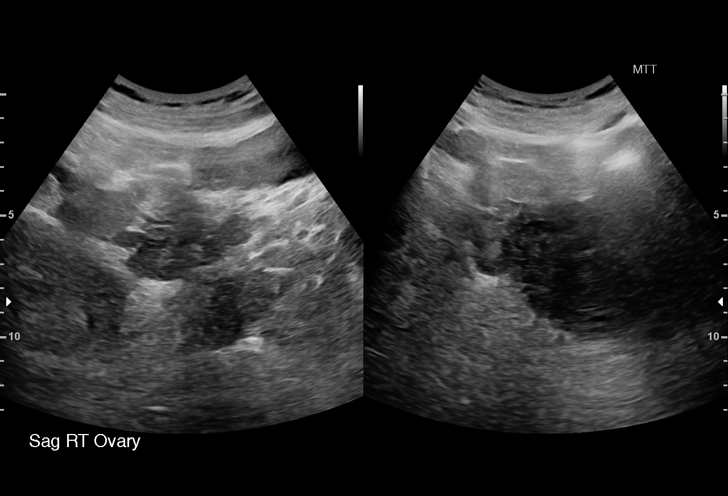
[im 24/80]
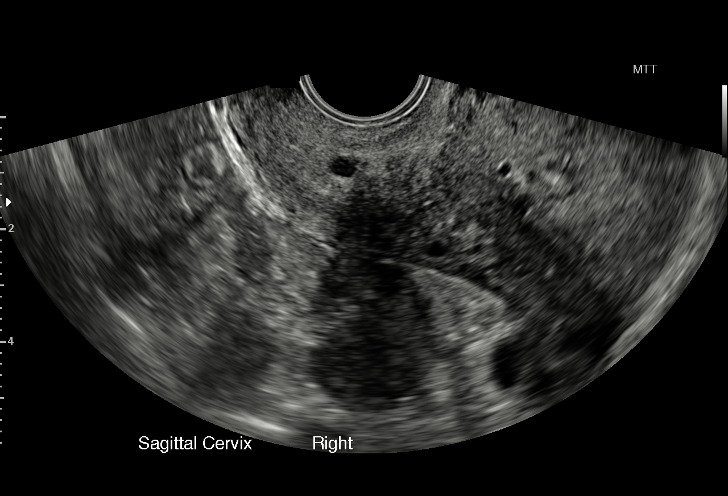
[im 30/80]
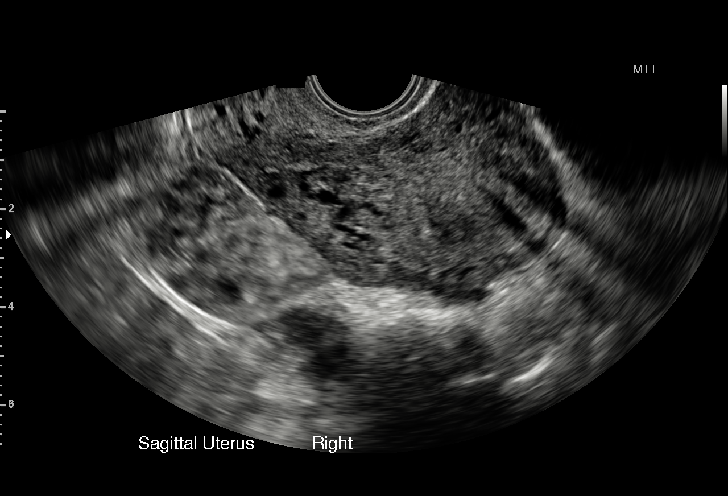
[im 36/80]
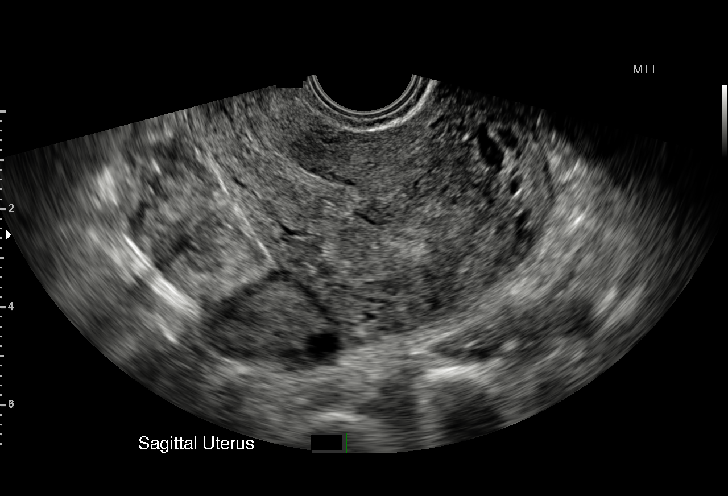
[im 41/80]
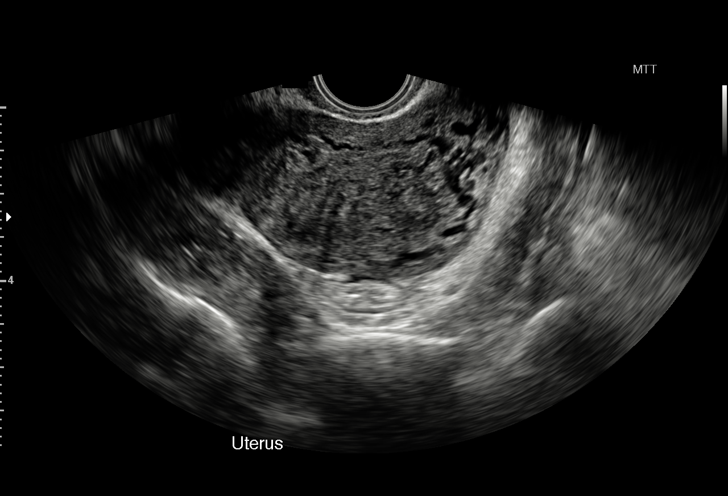
[im 44/80]
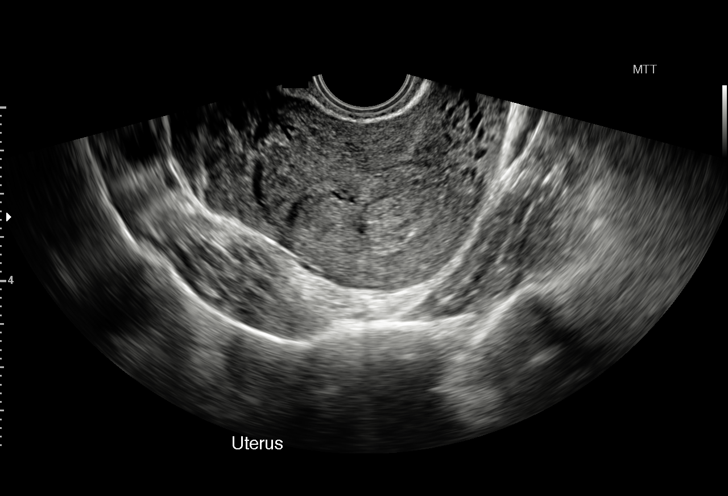
[im 50/80]
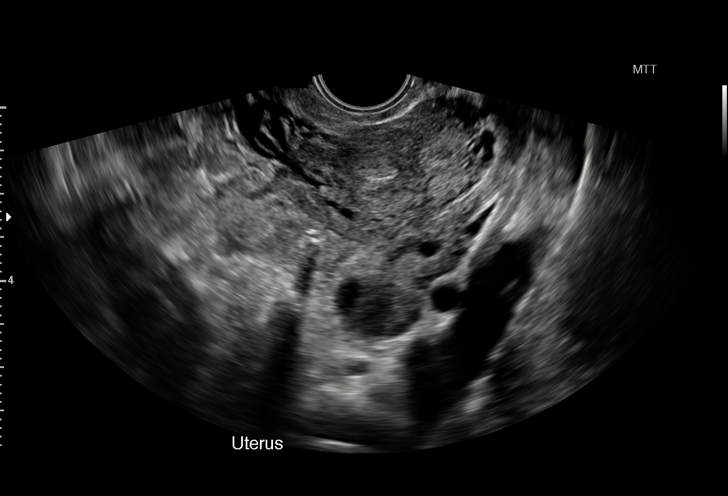
[im 56/80]
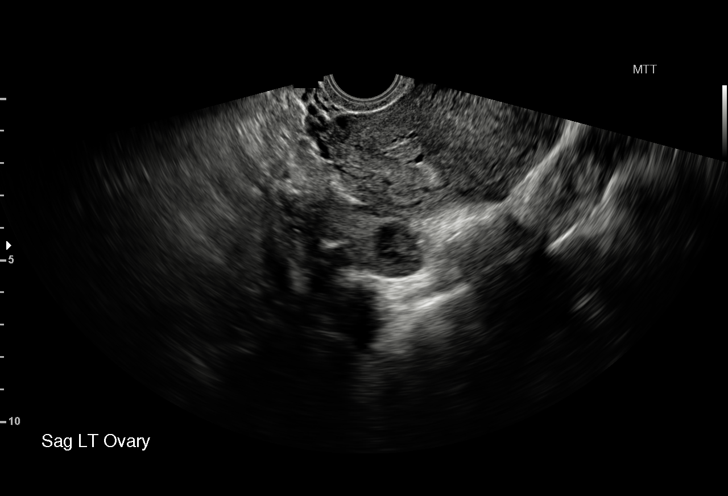
[im 62/80]
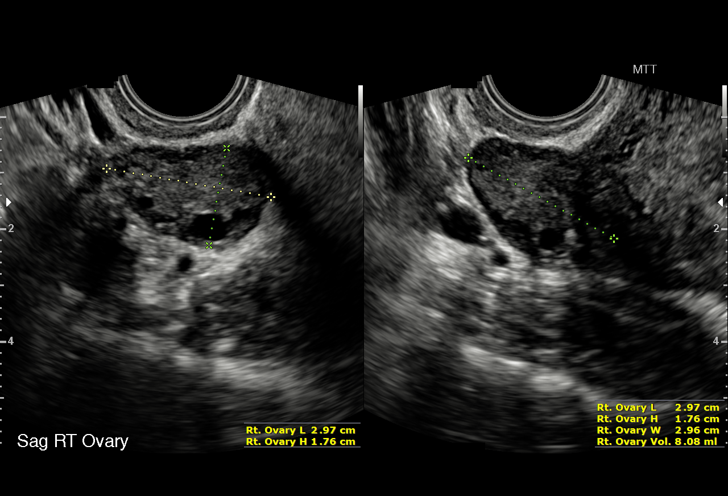
[im 68/80]
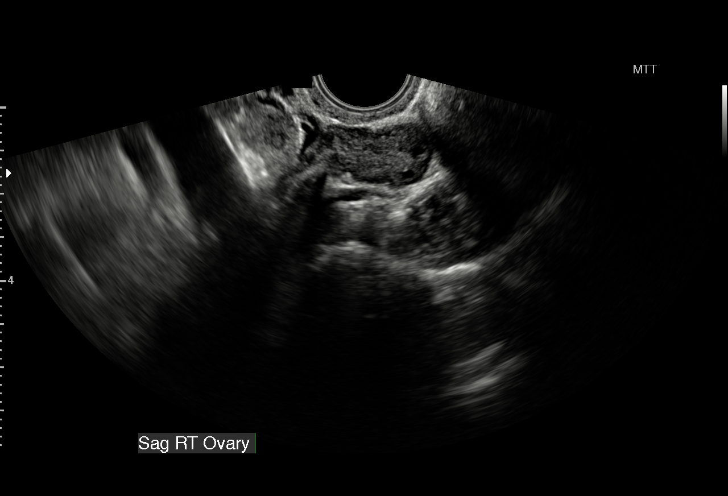
[im 74/80]
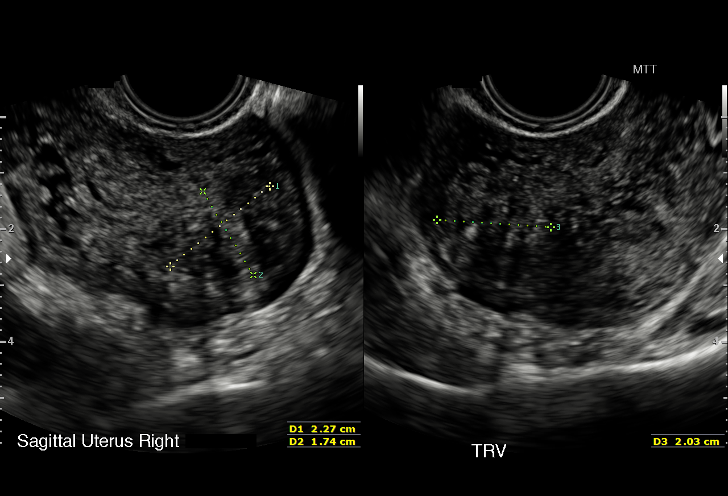
[im 80/80]
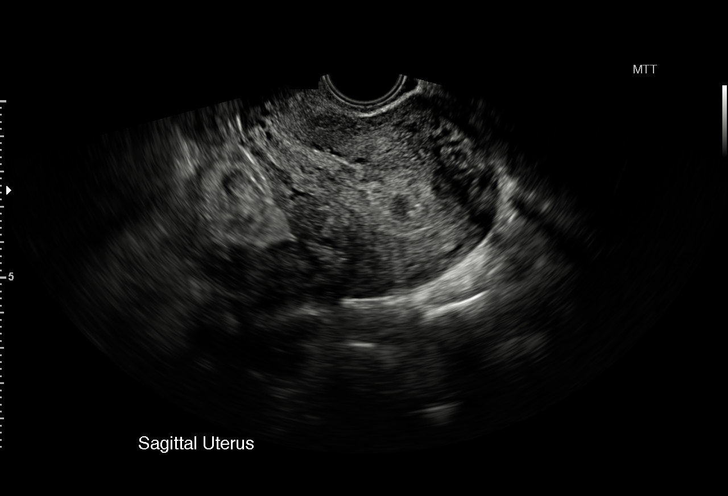

[15 of 28 positions shown; findings below may reference images not displayed]

FINDINGS: The uterus appears retroverted and demonstrates a heterogeneous
echotexture. A 2.3 x 1.7 x 2.0 cm ill-defined heterogeneous area in
the right fundal myometrium may represent a fibroid.

The endometrium is thickened and measures 2.5 cm. No intrauterine
pregnancy identified. In the absence of documented intrauterine
pregnancy, possibly of an ectopic pregnancy is not excluded.
Clinical correlation and follow-up with ultrasound and serial HCG
levels recommended.

The right ovary measures 3.0 x 1.8 x 3.0 cm and the left ovary
measures 4.5 x 1.6 x 5.0 cm. Left ovarian corpus luteum may be
present.

Trace free fluid within the pelvis.
IMPRESSION: Thickened endometrium. No intrauterine pregnancy identified.
Correlation with HCG level and follow-up with ultrasound
recommended.

Heterogeneous uterus with possible fundal fibroid.

Unremarkable ovaries.

## 2016-04-07 ENCOUNTER — Encounter: Payer: Self-pay | Admitting: Podiatry

## 2016-04-14 ENCOUNTER — Other Ambulatory Visit (INDEPENDENT_AMBULATORY_CARE_PROVIDER_SITE_OTHER): Payer: Federal, State, Local not specified - PPO

## 2016-04-14 DIAGNOSIS — B351 Tinea unguium: Secondary | ICD-10-CM

## 2016-05-07 ENCOUNTER — Ambulatory Visit (INDEPENDENT_AMBULATORY_CARE_PROVIDER_SITE_OTHER): Payer: Federal, State, Local not specified - PPO | Admitting: Family Medicine

## 2016-05-07 ENCOUNTER — Encounter: Payer: Self-pay | Admitting: Family Medicine

## 2016-05-07 VITALS — BP 114/74 | HR 71 | Wt 132.0 lb

## 2016-05-07 DIAGNOSIS — I10 Essential (primary) hypertension: Secondary | ICD-10-CM | POA: Diagnosis not present

## 2016-05-07 DIAGNOSIS — K047 Periapical abscess without sinus: Secondary | ICD-10-CM

## 2016-05-07 DIAGNOSIS — J4540 Moderate persistent asthma, uncomplicated: Secondary | ICD-10-CM

## 2016-05-07 MED ORDER — AMOXICILLIN-POT CLAVULANATE 500-125 MG PO TABS: ORAL_TABLET | ORAL | 0 refills | Status: AC

## 2016-05-07 MED ORDER — ALBUTEROL SULFATE HFA 108 (90 BASE) MCG/ACT IN AERS: INHALATION_SPRAY | RESPIRATORY_TRACT | 1 refills | Status: DC

## 2016-05-07 MED ORDER — MONTELUKAST SODIUM 10 MG PO TABS: 10.0000 mg | ORAL_TABLET | Freq: Every day | ORAL | 3 refills | Status: DC

## 2016-05-07 MED ORDER — HYDROCHLOROTHIAZIDE 25 MG PO TABS: 25.0000 mg | ORAL_TABLET | Freq: Every day | ORAL | 1 refills | Status: DC

## 2016-05-07 NOTE — Progress Notes (Signed)
CC: Toni Harmon is a 40 y.o. female is here for Asthma (3 month f/u) and Medication Refill   Subjective: HPI:  Follow-up essential hypertension: Continues to take hydrochlorothiazide on a daily basis. She denies any known side effects. There is no outside blood pressures to review. Denies chest pain or limb claudication. No motor or sensory disturbances.  Follow-up asthma: She was unable to get brand name Singulair covered by insurance. She is back to taking the generic equivalent. She is also using Advair, cromolyn and Claritin. She still has to use her albuterol a few days out of the week. No nocturnal symptoms  She complains of right posterior molar pain. It's been evaluated by her dentist and she is apparently going to get a root canal for an abscess next Wednesday. She tells me the pain feels dull and slowly worsening. It sensitive to hot and cold beverages. She denies fevers, chills nor nausea   Review Of Systems Outlined In HPI  Past Medical History:  Diagnosis Date  . Asthma   . GERD (gastroesophageal reflux disease)   . Hypertension    only to combat the effects of hctz    Past Surgical History:  Procedure Laterality Date  . DILATION AND CURETTAGE OF UTERUS  12/2011  . TONSILLECTOMY  2003   Family History  Problem Relation Age of Onset  . Cervical cancer Mother 86  . Hypertension Mother   . Food Allergy Mother   . Diabetes Maternal Grandfather     Social History   Social History  . Marital status: Married    Spouse name: N/A  . Number of children: N/A  . Years of education: N/A   Occupational History  . Not on file.   Social History Main Topics  . Smoking status: Never Smoker  . Smokeless tobacco: Never Used  . Alcohol use No  . Drug use: No  . Sexual activity: Yes    Partners: Male    Birth control/ protection: None   Other Topics Concern  . Not on file   Social History Narrative  . No narrative on file     Objective: BP 114/74   Pulse 71    Wt 132 lb (59.9 kg)   LMP 07/27/2015   BMI 21.31 kg/m   Vital signs reviewed. General: Alert and Oriented, No Acute Distress HEENT: Pupils equal, round, reactive to light. Conjunctivae clear.  External ears unremarkable.  Moist mucous membranes. Lungs: Clear and comfortable work of breathing, speaking in full sentences without accessory muscle use. No wheezing rhonchi or rales Cardiac: Regular rate and rhythm.  Neuro: CN II-XII grossly intact, gait normal. Extremities: No peripheral edema.  Strong peripheral pulses.  Mental Status: No depression, anxiety, nor agitation. Logical though process. Skin: Warm and dry.  Assessment & Plan: Toni Harmon was seen today for asthma and medication refill.  Diagnoses and all orders for this visit:  Essential hypertension, benign -     hydrochlorothiazide (HYDRODIURIL) 25 MG tablet; Take 1 tablet (25 mg total) by mouth daily.  Moderate persistent asthma, uncomplicated -     Discontinue: albuterol (VENTOLIN HFA) 108 (90 Base) MCG/ACT inhaler; INHALE 2 PUFFS INTO THE LUNGS EVERY 4 HOURS AS NEEDED FOR WHEEZING. -     Discontinue: albuterol (VENTOLIN HFA) 108 (90 Base) MCG/ACT inhaler; INHALE 2 PUFFS INTO THE LUNGS EVERY 4 HOURS AS NEEDED FOR WHEEZING. -     albuterol (VENTOLIN HFA) 108 (90 Base) MCG/ACT inhaler; INHALE 2 PUFFS INTO THE LUNGS EVERY 4 HOURS AS  NEEDED FOR WHEEZING.  Dental abscess -     amoxicillin-clavulanate (AUGMENTIN) 500-125 MG tablet; Take one by mouth every 8 hours for ten total days.  Other orders -     montelukast (SINGULAIR) 10 MG tablet; Take 1 tablet (10 mg total) by mouth at bedtime.   Essential hypertension: Controlled with hydrochlorothiazide Asthma: Controlled given that she is on maximum therapy and did not get any benefit from Spiriva. Dental abscess: To help reduce progression of pain start Augmentin.  Discussed with this patient that I will be resigning from my position here with [facility] in September in order  to stay with my family who will be moving to [facility]. I let him know about the providers that are still accepting patients and I feel that this individual will be under great care if he/she stays here with [facility].  Return in about 3 months (around 08/07/2016) for 3 month follow up.

## 2016-05-12 ENCOUNTER — Other Ambulatory Visit: Payer: Federal, State, Local not specified - PPO

## 2016-05-26 ENCOUNTER — Ambulatory Visit (INDEPENDENT_AMBULATORY_CARE_PROVIDER_SITE_OTHER): Payer: Federal, State, Local not specified - PPO

## 2016-05-26 DIAGNOSIS — B351 Tinea unguium: Secondary | ICD-10-CM

## 2016-05-26 NOTE — Progress Notes (Signed)
Pt presents with mycotic infection of nails 1-5 bilateral  All other systems are negative  Laser therapy administered to affected nails and tolerated well. All safety precautions were in place. Re-appointed in 1 month for 4th of possible 6 treatments total

## 2016-05-28 ENCOUNTER — Ambulatory Visit: Payer: Federal, State, Local not specified - PPO | Admitting: Family Medicine

## 2016-07-07 ENCOUNTER — Other Ambulatory Visit: Payer: Federal, State, Local not specified - PPO

## 2016-07-08 ENCOUNTER — Ambulatory Visit (INDEPENDENT_AMBULATORY_CARE_PROVIDER_SITE_OTHER): Payer: Federal, State, Local not specified - PPO

## 2016-07-08 DIAGNOSIS — B351 Tinea unguium: Secondary | ICD-10-CM

## 2016-07-08 NOTE — Progress Notes (Signed)
Pt presents with mycotic infection of nails 1-5 bilateral  All other systems are negative  Laser therapy administered to affected nails and tolerated well. All safety precautions were in place. Re-appointed in 1 month for 5th  of possible 6 treatments total

## 2016-07-22 ENCOUNTER — Emergency Department
Admission: EM | Admit: 2016-07-22 | Discharge: 2016-07-22 | Disposition: A | Discharge status: Home / Self Care | Payer: Federal, State, Local not specified - PPO | Source: Home / Self Care | Attending: Family Medicine | Admitting: Family Medicine

## 2016-07-22 ENCOUNTER — Encounter: Payer: Self-pay | Admitting: *Deleted

## 2016-07-22 DIAGNOSIS — J069 Acute upper respiratory infection, unspecified: Secondary | ICD-10-CM

## 2016-07-22 DIAGNOSIS — J4521 Mild intermittent asthma with (acute) exacerbation: Secondary | ICD-10-CM

## 2016-07-22 MED ORDER — AZITHROMYCIN 250 MG PO TABS: 250.0000 mg | ORAL_TABLET | Freq: Every day | ORAL | 0 refills | Status: DC

## 2016-07-22 MED ORDER — BENZONATATE 100 MG PO CAPS: 100.0000 mg | ORAL_CAPSULE | Freq: Three times a day (TID) | ORAL | 0 refills | Status: DC

## 2016-07-22 MED ORDER — PREDNISONE 20 MG PO TABS: ORAL_TABLET | ORAL | 0 refills | Status: DC

## 2016-07-22 NOTE — ED Triage Notes (Signed)
Pt c/o HA, productive and nonproductive cough, and RT side rib pain x 3 days. She has used her inhaler more frequently.

## 2016-07-22 NOTE — ED Provider Notes (Signed)
CSN: [id number]     Arrival date & time 07/22/16  1540 History   First MD Initiated Contact with Patient 07/22/16 1625     Chief Complaint  Patient presents with  . Cough   (Consider location/radiation/quality/duration/timing/severity/associated sxs/prior Treatment) HPI  Toni Harmon is a 40 y.o. female presenting to UC with hx of asthma c/o frontal headache, intermittent productive and nonproductive cough with production of yellow-green sputum, and Right rib pain for 3 days.  She has had to use her inhaler more frequently over the last few days. She has tried Sudafed w/o relief. Denies fever, chills, n/v/d. Denies sick contacts or recent travel but notes she typically gets sick around this time of year every year due to weather changing.     Past Medical History:  Diagnosis Date  . Asthma   . GERD (gastroesophageal reflux disease)   . Hypertension    only to combat the effects of hctz   Past Surgical History:  Procedure Laterality Date  . DILATION AND CURETTAGE OF UTERUS  12/2011  . TONSILLECTOMY  2003   Family History  Problem Relation Age of Onset  . Cervical cancer Mother 69  . Hypertension Mother   . Food Allergy Mother   . Diabetes Maternal Grandfather    Social History  Substance Use Topics  . Smoking status: Never Smoker  . Smokeless tobacco: Never Used  . Alcohol use No   OB History    Gravida Para Term Preterm AB Living   5       4 0   SAB TAB Ectopic Multiple Live Births   3 1           Review of Systems  Constitutional: Negative for chills and fever.  HENT: Positive for congestion, sinus pressure and sore throat. Negative for ear pain, postnasal drip, rhinorrhea, trouble swallowing and voice change.   Respiratory: Positive for cough and shortness of breath.   Cardiovascular: Positive for chest pain (Right side, only when coughing). Negative for palpitations.  Gastrointestinal: Negative for abdominal pain, diarrhea, nausea and vomiting.    Musculoskeletal: Negative for arthralgias, back pain and myalgias.  Skin: Negative for rash.  Neurological: Positive for headaches. Negative for dizziness and light-headedness.    Allergies  Compazine [prochlorperazine edisylate]; Eggs or egg-derived products; Other; Peanut-containing drug products; Ppd [tuberculin purified protein derivative]; and Latex  Home Medications   Prior to Admission medications   Medication Sig Start Date End Date Taking? Authorizing Provider  albuterol (VENTOLIN HFA) 108 (90 Base) MCG/ACT inhaler INHALE 2 PUFFS INTO THE LUNGS EVERY 4 HOURS AS NEEDED FOR WHEEZING. 05/07/16   Laren Boom, DO  AMBULATORY NON FORMULARY MEDICATION Nebulizer with necessary accessories.  Dx: Moderate Persistent Asthma 10/26/13   Laren Boom, DO  azithromycin (ZITHROMAX) 250 MG tablet Take 1 tablet (250 mg total) by mouth daily. Take first 2 tablets together, then 1 every day until finished. 07/22/16   Junius Finner, PA-C  benzonatate (TESSALON) 100 MG capsule Take 1-2 capsules (100-200 mg total) by mouth every 8 (eight) hours. 07/22/16   Junius Finner, PA-C  budesonide (RHINOCORT AQUA) 32 MCG/ACT nasal spray Place 2 sprays into both nostrils daily. 11/08/15   Laren Boom, DO  cetirizine HCl (ZYRTEC CHILDRENS ALLERGY) 5 MG/5ML SYRP Take 5 mLs (5 mg total) by mouth daily. 11/08/15   Laren Boom, DO  cromolyn (INTAL) 20 MG/2ML nebulizer solution Inhale 20mg  four times a day, may decrease to twice a day once symptoms improve. 11/08/15   Laren Boom,  DO  diclofenac (VOLTAREN) 75 MG EC tablet Take 1 tablet (75 mg total) by mouth 2 (two) times daily. 03/21/16   [name], DO  EPINEPHrine 0.3 mg/0.3 mL IJ SOAJ injection Inject 0.3 mLs (0.3 mg total) into the muscle once. 02/06/16   [name], DO  Fluticasone-Salmeterol (ADVAIR DISKUS) 500-50 MCG/DOSE AEPB Inhale 1 puff into the lungs 2 (two) times daily. 11/08/15   Laren Boom, DO  hydrochlorothiazide (HYDRODIURIL) 25 MG tablet Take 1 tablet (25 mg total)  by mouth daily. 05/07/16   [name], DO  loratadine (CLARITIN) 10 MG tablet Take 1 tablet (10 mg total) by mouth daily. 11/08/15   Laren Boom, DO  methocarbamol (ROBAXIN) 500 MG tablet Take 1 tablet (500 mg total) by mouth 4 (four) times daily. 03/16/16   Tilden Fossa, MD  montelukast (SINGULAIR) 10 MG tablet Take 1 tablet (10 mg total) by mouth at bedtime. 05/07/16   Laren Boom, DO  predniSONE (DELTASONE) 20 MG tablet 3 tabs po day one, then 2 po daily x 4 days 07/22/16   Junius Finner, PA-C  pseudoephedrine (SUDAFED) 30 MG tablet Take 1 tablet (30 mg total) by mouth every 4 (four) hours as needed for congestion. 11/08/15   [name], DO  ranitidine (ZANTAC) 150 MG tablet Take 150 mg by mouth daily.    Historical Provider, MD  terbinafine (LAMISIL) 250 MG tablet Take 1 tablet (250 mg total) by mouth daily. 03/14/16   Vivi Barrack, DPM   Meds Ordered and Administered this Visit  Medications - No data to display  BP 101/68 (BP Location: Left Arm)   Pulse 71   Temp 97.7 F (36.5 C) (Oral)   Resp 16   Ht 5\' 6"  (1.676 m)   Wt 133 lb (60.3 kg)   LMP 07/22/2016   SpO2 100%   BMI 21.47 kg/m  No data found.   Physical Exam  Constitutional: She appears well-developed and well-nourished. No distress.  HENT:  Head: Normocephalic and atraumatic.  Right Ear: Tympanic membrane normal.  Left Ear: Tympanic membrane normal.  Nose: Nose normal.  Mouth/Throat: Uvula is midline, oropharynx is clear and moist and mucous membranes are normal.  Eyes: Conjunctivae are normal. No scleral icterus.  Neck: Normal range of motion. Neck supple.  Cardiovascular: Normal rate, regular rhythm and normal heart sounds.   Pulmonary/Chest: Effort normal and breath sounds normal. No stridor. No respiratory distress. She has no wheezes. She has no rales.     She exhibits tenderness.    Right lower chest wall tenderness w/o deformity or crepitus  Abdominal: Soft. She exhibits no distension and no mass. There  is no tenderness. There is no guarding.  Musculoskeletal: Normal range of motion.  Lymphadenopathy:    She has no cervical adenopathy.  Neurological: She is alert.  Skin: Skin is warm and dry. She is not diaphoretic.  Nursing note and vitals reviewed.   Urgent Care Course   Clinical Course    Procedures (including critical care time)  Labs Review Labs Reviewed - No data to display  Imaging Review No results found.   MDM   1. Acute upper respiratory infection   2. Mild intermittent asthma with exacerbation    Pt c/o suddenly worsening asthma with associated URI symptoms.  O2 Sat 100% on RA w/o evidence of respiratory distress.   Symptoms likely viral in nature. Will prescribed Prednisone and tessalon, prescription for Azithromycin to fill if symptoms not improving in 2-3 days. Encouraged f/u with PCP  in 7-10 days if not improving, sooner if worsening.     Junius Finnerrin O'Malley, PA-C 07/22/16 1644

## 2016-08-12 ENCOUNTER — Other Ambulatory Visit: Payer: Federal, State, Local not specified - PPO

## 2016-08-22 ENCOUNTER — Ambulatory Visit (INDEPENDENT_AMBULATORY_CARE_PROVIDER_SITE_OTHER): Payer: Self-pay

## 2016-08-22 DIAGNOSIS — B351 Tinea unguium: Secondary | ICD-10-CM

## 2016-09-01 NOTE — Progress Notes (Signed)
Pt presents with mycotic infection of nails 1-5 bilateral  All other systems are negative  Laser therapy administered to affected nails and tolerated well. All safety precautions were in place. Re-appointed in 1 month for 6th and final treatment

## 2016-09-02 ENCOUNTER — Other Ambulatory Visit: Payer: Self-pay

## 2016-09-02 ENCOUNTER — Ambulatory Visit (INDEPENDENT_AMBULATORY_CARE_PROVIDER_SITE_OTHER): Payer: Federal, State, Local not specified - PPO | Admitting: Family Medicine

## 2016-09-02 ENCOUNTER — Encounter: Payer: Self-pay | Admitting: Family Medicine

## 2016-09-02 VITALS — BP 123/60 | HR 75 | Wt 131.0 lb

## 2016-09-02 DIAGNOSIS — I10 Essential (primary) hypertension: Secondary | ICD-10-CM | POA: Diagnosis not present

## 2016-09-02 DIAGNOSIS — J4540 Moderate persistent asthma, uncomplicated: Secondary | ICD-10-CM

## 2016-09-02 DIAGNOSIS — E559 Vitamin D deficiency, unspecified: Secondary | ICD-10-CM | POA: Insufficient documentation

## 2016-09-02 DIAGNOSIS — K047 Periapical abscess without sinus: Secondary | ICD-10-CM

## 2016-09-02 DIAGNOSIS — D509 Iron deficiency anemia, unspecified: Secondary | ICD-10-CM | POA: Insufficient documentation

## 2016-09-02 DIAGNOSIS — D649 Anemia, unspecified: Secondary | ICD-10-CM

## 2016-09-02 MED ORDER — CIPROFLOXACIN HCL 500 MG PO TABS: 500.0000 mg | ORAL_TABLET | Freq: Two times a day (BID) | ORAL | 0 refills | Status: DC

## 2016-09-02 MED ORDER — EPINEPHRINE 0.3 MG/0.3ML IJ SOAJ: 0.3000 mg | Freq: Once | INTRAMUSCULAR | 0 refills | Status: AC

## 2016-09-02 MED ORDER — ALBUTEROL SULFATE HFA 108 (90 BASE) MCG/ACT IN AERS: INHALATION_SPRAY | RESPIRATORY_TRACT | 1 refills | Status: DC

## 2016-09-02 NOTE — Patient Instructions (Signed)
Thank you for coming in today. Return in 6-12 months or sooner if needed.  Get labs today.  Continue asthma medicines.  Use the advair twice daily.  Take cipro.     Consider a positive pressure filtration system for work. 4 Psychologist, educationalowered Air Purifying Respirator (PAPR) -

## 2016-09-02 NOTE — Progress Notes (Signed)
Toni Harmon is a 40 y.o. female who [name] today for establish care and discuss several medical issues. Patient's previous physician has since left the practice and she is looking to establish care.  1) asthma: Patient has a history of moderately well-controlled asthma. She uses the below medications regularly. She notes she has to use albuterol usually more than once per day when she's at work. 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 She finds the respirator at work to be very uncomfortable to wear. She notes the Ventolin brand of albuterol works much better for her. She does not tolerate pro-air very well. She denies any fevers chills vomiting or diarrhea.  2) dental abscess: Patient has a left upper dental abscess. This is status post a dental procedure. She was prescribed amoxicillin which has not work. She would like a prescription for Cipro which has worked well for her in the past. She notes some pain but denies any fever.  3) anemia: Patient attempted to donate blood recently. She was told she has a low iron count. She would like to double check this lab to find out. She feels reasonably well without significant lightheadedness or dizziness or excessive bleeding.   Past Medical History:  Diagnosis Date  . Asthma   . GERD (gastroesophageal reflux disease)   . Hypertension    only to combat the effects of hctz   Past Surgical History:  Procedure Laterality Date  . DILATION AND CURETTAGE OF UTERUS  12/2011  . TONSILLECTOMY  2003   Social History  Substance Use Topics  . Smoking status: Never Smoker  . Smokeless tobacco: Never Used  . Alcohol use No   family history includes Cervical cancer (age of onset: 2245) in her mother; Diabetes in her maternal grandfather; Food Allergy in her mother; Hypertension in her mother.  ROS as  above:  Medications: Current Outpatient Prescriptions  Medication Sig Dispense Refill  . albuterol (VENTOLIN HFA) 108 (90 Base) MCG/ACT inhaler INHALE 2 PUFFS INTO THE LUNGS EVERY 4 HOURS AS NEEDED FOR WHEEZING. 18 Inhaler 1  . AMBULATORY NON FORMULARY MEDICATION Nebulizer with necessary accessories.  Dx: Moderate Persistent Asthma 1 Units 0  . budesonide (RHINOCORT AQUA) 32 MCG/ACT nasal spray Place 2 sprays into both nostrils daily. 8.6 g 11  . cetirizine HCl (ZYRTEC CHILDRENS ALLERGY) 5 MG/5ML SYRP Take 5 mLs (5 mg total) by mouth daily. 300 mL   . ciprofloxacin (CIPRO) 500 MG tablet Take 1 tablet (500 mg total) by mouth 2 (two) times daily. 20 tablet 0  . EPINEPHrine 0.3 mg/0.3 mL IJ SOAJ injection Inject 0.3 mLs (0.3 mg total) into the muscle once. 2 Device 0  . Fluticasone-Salmeterol (ADVAIR DISKUS) 500-50 MCG/DOSE AEPB Inhale 1 puff into the lungs 2 (two) times daily. 60 each 5  . loratadine (CLARITIN) 10 MG tablet Take 1 tablet (10 mg total) by mouth daily. 90 tablet 1  . montelukast (SINGULAIR) 10 MG tablet Take 1 tablet (10 mg total) by mouth at bedtime. 30 tablet 3  . ranitidine (ZANTAC) 150 MG tablet Take 150 mg by mouth daily.     No current facility-administered medications for this visit.    Allergies  Allergen Reactions  . Compazine [Prochlorperazine Edisylate]     seizure  . Eggs Or Egg-Derived Products   . Other     Tree nuts, melon, gluten intolerance, eggs  . Peanut-Containing Drug Products   .  Ppd [Tuberculin Purified Protein Derivative]     Allergic reaction  . Latex Hives and Rash    Health Maintenance Health Maintenance  Topic Date Due  . TETANUS/TDAP  09/02/2017 (Originally 03/28/1996)  . INFLUENZA VACCINE  09/02/2026 (Originally 05/06/2016)  . PAP SMEAR  03/18/2018  . HIV Screening  Completed     Exam:  BP 123/60   Pulse 75   Wt 131 lb (59.4 kg)   BMI 21.14 kg/m  Gen: Well NAD HEENT: EOMI,  MMM Dental abscess present upper left gum. Tender to  touch fluctuant. Lungs: Normal work of breathing. CTABL Heart: RRR no MRG Abd: NABS, Soft. Nondistended, Nontender Exts: Brisk capillary refill, warm and well perfused.   Incision and drainage of dental abscess: Consent obtained and timeout performed. Sharp incision made to the area of fluctuance and a small amount of pus and blood was drained. Patient tolerated the procedure well.   No results found for this or any previous visit (from the past 72 hour(s)). No results found.    Assessment and Plan: 40 y.o. female with  Asthma: Moderately well controlled. Tinea current medical regimen. Recommend patient use better ventilation at work. Consider positive pressure respirator.  Dental abscess drained as above. Treat with ciprofloxacin.  Iron deficiency/anemia: Obtain labs as below to work this issue up.   Orders Placed This Encounter  Procedures  . CBC  . COMPLETE METABOLIC PANEL WITH GFR  . Ferritin  . Iron and TIBC  . VITAMIN D 25 Hydroxy (Vit-D Deficiency, Fractures)    Discussed warning signs or symptoms. Please see discharge instructions. Patient expresses understanding.

## 2016-09-19 ENCOUNTER — Ambulatory Visit: Payer: Federal, State, Local not specified - PPO

## 2016-10-03 ENCOUNTER — Ambulatory Visit (INDEPENDENT_AMBULATORY_CARE_PROVIDER_SITE_OTHER): Payer: Self-pay | Admitting: Podiatry

## 2016-10-03 DIAGNOSIS — B351 Tinea unguium: Secondary | ICD-10-CM

## 2016-10-15 NOTE — Progress Notes (Signed)
Pt presents with mycotic infection of nails 1-5 bilateral, with concern about 2nd nail  All other systems are negative  Noted small area of white discoloration at the base of the 2nd toe, she also c/o painful hallux nail  Laser therapy administered to affected nails and tolerated well. All safety precautions were in place. This is her final laser treatment. Advised her to continue to watch the nail, if symptoms did not improve she is to follow up, also she is to make an appt with Dr Ardelle AntonWagoner regarding nail pain and to have him evaluate discoloration

## 2016-12-03 ENCOUNTER — Encounter: Payer: Self-pay | Admitting: *Deleted

## 2016-12-03 ENCOUNTER — Emergency Department
Admission: EM | Admit: 2016-12-03 | Discharge: 2016-12-03 | Disposition: A | Discharge status: Home / Self Care | Payer: Federal, State, Local not specified - PPO | Source: Home / Self Care | Attending: Family Medicine | Admitting: Family Medicine

## 2016-12-03 DIAGNOSIS — J069 Acute upper respiratory infection, unspecified: Secondary | ICD-10-CM | POA: Diagnosis not present

## 2016-12-03 DIAGNOSIS — B9789 Other viral agents as the cause of diseases classified elsewhere: Secondary | ICD-10-CM | POA: Diagnosis not present

## 2016-12-03 MED ORDER — PREDNISONE 20 MG PO TABS: ORAL_TABLET | ORAL | 0 refills | Status: DC

## 2016-12-03 MED ORDER — BENZONATATE 100 MG PO CAPS: 100.0000 mg | ORAL_CAPSULE | Freq: Three times a day (TID) | ORAL | 0 refills | Status: DC

## 2016-12-03 NOTE — ED Provider Notes (Signed)
CSN: [id number]     Arrival date & time 12/03/16  1550 History   First MD Initiated Contact with Patient 12/03/16 1644     Chief Complaint  Patient presents with  . Cough   (Consider location/radiation/quality/duration/timing/severity/associated sxs/prior Treatment) HPI Toni Harmon is a 41 y.o. female presenting to UC with c/o dry cough and wheeze for about 3 days. She notes recent weather changes and working in a duty environment at work have caused her asthma to flare up.  She has been using her rescue inhaler up to 4 times a day recently.  Denies fever, chills, n/v/d. She has been taking claritin in the morning, children's zyrtec at night and usually takes sudafed but has not taken this week as she recently ran out of it. Denies sick contacts or recent travel.    Past Medical History:  Diagnosis Date  . Asthma   . GERD (gastroesophageal reflux disease)   . Hypertension    only to combat the effects of hctz   Past Surgical History:  Procedure Laterality Date  . APPENDECTOMY    . DILATION AND CURETTAGE OF UTERUS  12/2011  . TONSILLECTOMY  2003   Family History  Problem Relation Age of Onset  . Cervical cancer Mother 7145  . Hypertension Mother   . Food Allergy Mother   . Diabetes Maternal Grandfather    Social History  Substance Use Topics  . Smoking status: Never Smoker  . Smokeless tobacco: Never Used  . Alcohol use No   OB History    Gravida Para Term Preterm AB Living   5       4 0   SAB TAB Ectopic Multiple Live Births   3 1           Review of Systems  Constitutional: Negative for chills and fever.  HENT: Positive for congestion. Negative for ear pain, sore throat, trouble swallowing and voice change.   Respiratory: Positive for cough, chest tightness and wheezing. Negative for shortness of breath.   Cardiovascular: Negative for chest pain and palpitations.  Gastrointestinal: Negative for abdominal pain, diarrhea, nausea and vomiting.  Musculoskeletal:  Negative for arthralgias, back pain and myalgias.  Skin: Negative for rash.    Allergies  Compazine [prochlorperazine edisylate]; Eggs or egg-derived products; Other; Peanut-containing drug products; Ppd [tuberculin purified protein derivative]; and Latex  Home Medications   Prior to Admission medications   Medication Sig Start Date End Date Taking? Authorizing Provider  albuterol (VENTOLIN HFA) 108 (90 Base) MCG/ACT inhaler INHALE 2 PUFFS INTO THE LUNGS EVERY 4 HOURS AS NEEDED FOR WHEEZING. 09/02/16   Rodolph BongEvan S Corey, MD  AMBULATORY NON FORMULARY MEDICATION Nebulizer with necessary accessories.  Dx: Moderate Persistent Asthma 10/26/13   Laren BoomSean Hommel, DO  benzonatate (TESSALON) 100 MG capsule Take 1-2 capsules (100-200 mg total) by mouth every 8 (eight) hours. 12/03/16   Junius FinnerErin O'Malley, PA-C  budesonide (RHINOCORT AQUA) 32 MCG/ACT nasal spray Place 2 sprays into both nostrils daily. 11/08/15   Laren BoomSean Hommel, DO  cetirizine HCl (ZYRTEC CHILDRENS ALLERGY) 5 MG/5ML SYRP Take 5 mLs (5 mg total) by mouth daily. 11/08/15   [name], DO  Fluticasone-Salmeterol (ADVAIR DISKUS) 500-50 MCG/DOSE AEPB Inhale 1 puff into the lungs 2 (two) times daily. 11/08/15   [name], DO  loratadine (CLARITIN) 10 MG tablet Take 1 tablet (10 mg total) by mouth daily. 11/08/15   [name], DO  montelukast (SINGULAIR) 10 MG tablet Take 1 tablet (10 mg total) by mouth at bedtime.  05/07/16   Laren Boom, DO  predniSONE (DELTASONE) 20 MG tablet 3 tabs po day one, then 2 po daily x 4 days 12/03/16   Junius Finner, PA-C  ranitidine (ZANTAC) 150 MG tablet Take 150 mg by mouth daily.    Historical Provider, MD   Meds Ordered and Administered this Visit  Medications - No data to display  BP 113/76 (BP Location: Left Arm)   Pulse 61   Temp 97.6 F (36.4 C) (Oral)   Resp 16   Ht 5\' 6"  (1.676 m)   Wt 137 lb (62.1 kg)   LMP 12/03/2016   SpO2 100%   BMI 22.11 kg/m  No data found.   Physical Exam  Constitutional: She is oriented  to person, place, and time. She appears well-developed and well-nourished. No distress.  Pt sitting on exam bed, appears well, non-toxic. NAD  HENT:  Head: Normocephalic and atraumatic.  Right Ear: Tympanic membrane normal.  Left Ear: Tympanic membrane normal.  Nose: Nose normal.  Mouth/Throat: Uvula is midline, oropharynx is clear and moist and mucous membranes are normal.  Eyes: EOM are normal.  Neck: Normal range of motion. Neck supple.  Cardiovascular: Normal rate.   Pulmonary/Chest: Effort normal and breath sounds normal. No stridor. No respiratory distress. She has no wheezes. She has no rales.  Musculoskeletal: Normal range of motion.  Lymphadenopathy:    She has no cervical adenopathy.  Neurological: She is alert and oriented to person, place, and time.  Skin: Skin is warm and dry. She is not diaphoretic.  Psychiatric: She has a normal mood and affect. Her behavior is normal.  Nursing note and vitals reviewed.   Urgent Care Course     Procedures (including critical care time)  Labs Review Labs Reviewed - No data to display  Imaging Review No results found.    MDM   1. Viral URI with cough    Pt c/o 3 days of dry cough with wheeze  And mild congestion.  Pt is afebrile. O2 Sat is 100% on RA  Lungs: CTAB  Symptoms likely due to viral illness such as common cold, with mild asthma exacerbation Rx: Prednisone and tessalon  F/u with PCP in1 week if not improving.     Junius Finner, PA-C 12/03/16 [phone]

## 2016-12-03 NOTE — ED Triage Notes (Signed)
Pt c/o nonproductive cough and wheezing x 3 days. denies fever.

## 2016-12-09 ENCOUNTER — Ambulatory Visit (INDEPENDENT_AMBULATORY_CARE_PROVIDER_SITE_OTHER): Payer: Federal, State, Local not specified - PPO | Admitting: Family Medicine

## 2016-12-09 VITALS — BP 102/61 | HR 75 | Wt 138.0 lb

## 2016-12-09 DIAGNOSIS — K588 Other irritable bowel syndrome: Secondary | ICD-10-CM

## 2016-12-09 DIAGNOSIS — F329 Major depressive disorder, single episode, unspecified: Secondary | ICD-10-CM

## 2016-12-09 DIAGNOSIS — D649 Anemia, unspecified: Secondary | ICD-10-CM | POA: Diagnosis not present

## 2016-12-09 DIAGNOSIS — I10 Essential (primary) hypertension: Secondary | ICD-10-CM | POA: Diagnosis not present

## 2016-12-09 DIAGNOSIS — F32A Depression, unspecified: Secondary | ICD-10-CM

## 2016-12-09 DIAGNOSIS — F418 Other specified anxiety disorders: Secondary | ICD-10-CM | POA: Diagnosis not present

## 2016-12-09 DIAGNOSIS — J4540 Moderate persistent asthma, uncomplicated: Secondary | ICD-10-CM | POA: Diagnosis not present

## 2016-12-09 DIAGNOSIS — E559 Vitamin D deficiency, unspecified: Secondary | ICD-10-CM | POA: Diagnosis not present

## 2016-12-09 DIAGNOSIS — F419 Anxiety disorder, unspecified: Secondary | ICD-10-CM

## 2016-12-09 LAB — COMPLETE METABOLIC PANEL WITH GFR
ALT: 10 U/L (ref 6–29)
AST: 12 U/L (ref 10–30)
Albumin: 4.2 g/dL (ref 3.6–5.1)
Alkaline Phosphatase: 52 U/L (ref 33–115)
BUN: 20 mg/dL (ref 7–25)
CHLORIDE: 105 mmol/L (ref 98–110)
CO2: 27 mmol/L (ref 20–31)
CREATININE: 0.77 mg/dL (ref 0.50–1.10)
Calcium: 10.1 mg/dL (ref 8.6–10.2)
GFR, Est Non African American: >89 mL/min (ref >=60)
Glucose, Bld: 72 mg/dL (ref 65–99)
Potassium: 4.8 mmol/L (ref 3.5–5.3)
Sodium: 142 mmol/L (ref 135–146)
Total Bilirubin: 0.4 mg/dL (ref 0.2–1.2)
Total Protein: 6.3 g/dL (ref 6.1–8.1)

## 2016-12-09 LAB — CBC
HCT: 39.4 % (ref 35.0–45.0)
Hemoglobin: 12.9 g/dL (ref 11.7–15.5)
MCH: 28.5 pg (ref 27.0–33.0)
MCHC: 32.7 g/dL (ref 32.0–36.0)
MCV: 87 fL (ref 80.0–100.0)
MPV: 8.7 fL (ref 7.5–12.5)
PLATELETS: 376 10*3/uL (ref 140–400)
RBC: 4.53 MIL/uL (ref 3.80–5.10)
RDW: 14.1 % (ref 11.0–15.0)
WBC: 4.5 10*3/uL (ref 3.8–10.8)

## 2016-12-09 LAB — IRON AND TIBC
%SAT: 15 % (ref 11–50)
IRON: 50 ug/dL (ref 40–190)
TIBC: 331 ug/dL (ref 250–450)
UIBC: 281 ug/dL (ref 125–400)

## 2016-12-09 LAB — FERRITIN: Ferritin: 18 ng/mL (ref 10–154)

## 2016-12-09 NOTE — Progress Notes (Signed)
Toni Harmon is a 41 y.o. female who presents to [name]: Primary [facility] Medicine today for follow up asthma, anxiety/IBS, and anemia.  Asthma. Patient uses Advair and singular listed below. Additionally she takes over-the-counter allergy medicine daily. She uses her albuterol inhaler occasionally. Typically springtime is worse for her. She's feeling pretty good today.  Anxiety/IBS: Patient has well-controlled anxiety/IBS. She's here today for FMLA paperwork. Symptoms are well controlled but occasionally have flareups. These occur typically a few times a year and last a few days.  Anemia: At the last visit patient was told that she has anemia after trying to donate blood. She labs ordered but not obtained.   Past Medical History:  Diagnosis Date  . Asthma   . GERD (gastroesophageal reflux disease)   . Hypertension    only to combat the effects of hctz   Past Surgical History:  Procedure Laterality Date  . APPENDECTOMY    . DILATION AND CURETTAGE OF UTERUS  12/2011  . TONSILLECTOMY  2003   Social History  Substance Use Topics  . Smoking status: Never Smoker  . Smokeless tobacco: Never Used  . Alcohol use No   family history includes Cervical cancer (age of onset: 70) in her mother; Diabetes in her maternal grandfather; Food Allergy in her mother; Hypertension in her mother.  ROS as above:  Medications: Current Outpatient Prescriptions  Medication Sig Dispense Refill  . Prenatal Vit-Fe Fumarate-FA (PRENATAL MULTIVITAMIN) TABS tablet Take 1 tablet by mouth daily at 12 noon.    . vitamin C (ASCORBIC ACID) 500 MG tablet Take 500 mg by mouth daily.    Marland Kitchen albuterol (VENTOLIN HFA) 108 (90 Base) MCG/ACT inhaler INHALE 2 PUFFS INTO THE LUNGS EVERY 4 HOURS AS NEEDED FOR WHEEZING. 18 Inhaler 1  . AMBULATORY NON FORMULARY MEDICATION Nebulizer with necessary accessories.  Dx: Moderate  Persistent Asthma 1 Units 0  . budesonide (RHINOCORT AQUA) 32 MCG/ACT nasal spray Place 2 sprays into both nostrils daily. 8.6 g 11  . cetirizine HCl (ZYRTEC CHILDRENS ALLERGY) 5 MG/5ML SYRP Take 5 mLs (5 mg total) by mouth daily. 300 mL   . Fluticasone-Salmeterol (ADVAIR DISKUS) 500-50 MCG/DOSE AEPB Inhale 1 puff into the lungs 2 (two) times daily. 60 each 5  . loratadine (CLARITIN) 10 MG tablet Take 1 tablet (10 mg total) by mouth daily. 90 tablet 1  . montelukast (SINGULAIR) 10 MG tablet Take 1 tablet (10 mg total) by mouth at bedtime. 30 tablet 3  . ranitidine (ZANTAC) 150 MG tablet Take 150 mg by mouth daily.     No current facility-administered medications for this visit.    Allergies  Allergen Reactions  . Compazine [Prochlorperazine Edisylate]     seizure  . Eggs Or Egg-Derived Products   . Other     Tree nuts, melon, gluten intolerance, eggs  . Peanut-Containing Drug Products   . Ppd [Tuberculin Purified Protein Derivative]     Allergic reaction  . Latex Hives and Rash    Health Maintenance Health Maintenance  Topic Date Due  . TETANUS/TDAP  09/02/2017 (Originally 03/28/1996)  . INFLUENZA VACCINE  09/02/2026 (Originally 05/06/2016)  . PAP SMEAR  03/18/2018  . HIV Screening  Completed     Exam:  BP 102/61   Pulse 75   Wt 138 lb (62.6 kg)   LMP 12/03/2016   BMI 22.27 kg/m  Gen: Well NAD HEENT: EOMI,  MMM Lungs: Normal work of breathing. CTABL Heart: RRR no  MRG Abd: NABS, Soft. Nondistended, Nontender Exts: Brisk capillary refill, warm and well perfused.  Psych: Alert and oriented process and affect.   No results found for this or any previous visit (from the past 72 hour(s)). No results found.    Assessment and Plan: 41 y.o. female with  Asthma: Doing well. Continue current regimen. If patient develops asthma symptoms and thinks she needs prednisone I'll call in and have patient return to clinic in a day or 2 later in an effort to avoid unnecessary  emergency room or urgent care trips.  Anxiety/IBS: Doing well continue current regimen. FMLA paperwork filled out.   Anemia: Labs pending. Follow-up as needed.   orders of the defined types were placed in this encounter.  Meds ordered this encounter  Medications  . vitamin C (ASCORBIC ACID) 500 MG tablet    Sig: Take 500 mg by mouth daily.  . Prenatal Vit-Fe Fumarate-FA (PRENATAL MULTIVITAMIN) TABS tablet    Sig: Take 1 tablet by mouth daily at 12 noon.     Discussed warning signs or symptoms. Please see discharge instructions. Patient expresses understanding.

## 2016-12-09 NOTE — Patient Instructions (Signed)
Thank you for coming in today. Let me know how your asthma is doing.  We will also track anxiety and IBS.  Get labs today and we will contact you ASAP about anemia and other labs ordered.  Recheck as needed.  If you feel like you are starting to have an asthma attack let me know ASAP and we will call in Prednisone and have you schedule the next day or two.   Call or go to the emergency room if you get worse, have trouble breathing, have chest pains, or palpitations.    Anemia, Nonspecific Anemia is a condition in which the concentration of red blood cells or hemoglobin in the blood is below normal. Hemoglobin is a substance in red blood cells that carries oxygen to the tissues of the body. Anemia results in not enough oxygen reaching these tissues. What are the causes? Common causes of anemia include:  Excessive bleeding. Bleeding may be internal or external. This includes excessive bleeding from periods (in women) or from the intestine.  Poor nutrition.  Chronic kidney, thyroid, and liver disease.  Bone marrow disorders that decrease red blood cell production.  Cancer and treatments for cancer.  HIV, AIDS, and their treatments.  Spleen problems that increase red blood cell destruction.  Blood disorders.  Excess destruction of red blood cells due to infection, medicines, and autoimmune disorders. What are the signs or symptoms?  Minor weakness.  Dizziness.  Headache.  Palpitations.  Shortness of breath, especially with exercise.  Paleness.  Cold sensitivity.  Indigestion.  Nausea.  Difficulty sleeping.  Difficulty concentrating. Symptoms may occur suddenly or they may develop slowly. How is this diagnosed? Additional blood tests are often needed. These help your health care provider determine the best treatment. Your health care provider will check your stool for blood and look for other causes of blood loss. How is this treated? Treatment varies depending  on the cause of the anemia. Treatment can include:  Supplements of iron, vitamin B12, or folic acid.  Hormone medicines.  A blood transfusion. This may be needed if blood loss is severe.  Hospitalization. This may be needed if there is significant continual blood loss.  Dietary changes.  Spleen removal. Follow these instructions at home: Keep all follow-up appointments. It often takes many weeks to correct anemia, and having your health care provider check on your condition and your response to treatment is very important. Get help right away if:  You develop extreme weakness, shortness of breath, or chest pain.  You become dizzy or have trouble concentrating.  You develop heavy vaginal bleeding.  You develop a rash.  You have bloody or black, tarry stools.  You faint.  You vomit up blood.  You vomit repeatedly.  You have abdominal pain.  You have a fever or persistent symptoms for more than 2-3 days.  You have a fever and your symptoms suddenly get worse.  You are dehydrated. This information is not intended to replace advice given to you by your health care provider. Make sure you discuss any questions you have with your health care provider. Document Released: 10/30/2004 Document Revised: 03/05/2016 Document Reviewed: 03/18/2013 Elsevier Interactive Patient Education  2017 [employer]

## 2016-12-10 LAB — VITAMIN D 25 HYDROXY (VIT D DEFICIENCY, FRACTURES): VIT D 25 HYDROXY: 78 ng/mL (ref 30–100)

## 2017-01-05 ENCOUNTER — Telehealth: Payer: Self-pay | Admitting: Family Medicine

## 2017-01-05 NOTE — Telephone Encounter (Signed)
Paperwork placed in Clementeen Graham, MD in basket. Will completed asap and notify pt.

## 2017-01-05 NOTE — Telephone Encounter (Signed)
Mrs. Armentrout is needing her FMLA papers if they are completed, but if not she was just needing to know so that she can get an extension from her employer.Marland KitchenMarland Kitchen

## 2017-01-07 NOTE — Telephone Encounter (Signed)
Paper work completed.

## 2017-01-08 ENCOUNTER — Ambulatory Visit: Payer: Federal, State, Local not specified - PPO | Admitting: Family Medicine

## 2017-01-08 NOTE — Telephone Encounter (Signed)
Forms placed up front for pickup. Pt notified. Pt verbalized understanding.

## 2017-01-21 ENCOUNTER — Ambulatory Visit (INDEPENDENT_AMBULATORY_CARE_PROVIDER_SITE_OTHER): Payer: Federal, State, Local not specified - PPO | Admitting: Family Medicine

## 2017-01-21 ENCOUNTER — Encounter: Payer: Self-pay | Admitting: Family Medicine

## 2017-01-21 VITALS — BP 116/78 | HR 73 | Temp 97.6°F | Wt 141.0 lb

## 2017-01-21 DIAGNOSIS — Z91018 Allergy to other foods: Secondary | ICD-10-CM | POA: Diagnosis not present

## 2017-01-21 DIAGNOSIS — J4541 Moderate persistent asthma with (acute) exacerbation: Secondary | ICD-10-CM | POA: Diagnosis not present

## 2017-01-21 MED ORDER — ALBUTEROL SULFATE (2.5 MG/3ML) 0.083% IN NEBU: 2.5000 mg | INHALATION_SOLUTION | Freq: Four times a day (QID) | RESPIRATORY_TRACT | 1 refills | Status: DC | PRN

## 2017-01-21 MED ORDER — EPINEPHRINE 0.3 MG/0.3ML IJ SOAJ: 0.3000 mg | Freq: Once | INTRAMUSCULAR | 1 refills | Status: AC

## 2017-01-21 MED ORDER — PREDNISONE 5 MG (48) PO TBPK: ORAL_TABLET | ORAL | 0 refills | Status: DC

## 2017-01-21 NOTE — Patient Instructions (Signed)
Thank you for coming in today. Take prednisone for 12 days.  Work on avoiding soybeans.  Continue asthma plan.   Use epi-pen as needed.   Recheck in 3 months or sooner if needed.    Food Allergy A food allergy is an abnormal reaction to a food (food allergen) by the body's defense system (immune system). Foods that commonly cause allergies are:  Milk.  Seafood.  Eggs.  Nuts.  Wheat.  Soy. What are the causes? Food allergies happen when the immune system mistakenly sees a food as harmful and releases antibodies to fight it. What are the signs or symptoms? Symptoms may be mild or severe. They usually start minutes after the food is eaten, but they can occur even a few hours later. In people with a severe allergy, symptoms can start within seconds. Mild symptoms   Nasal congestion.  Tingling in the mouth.  An itchy, red rash.  Vomiting.  Diarrhea. Severe symptoms   Swelling of the lips, face, and tongue.  Swelling of the back of the mouth and throat.  Wheezing.  A hoarse voice.  Itchy, red, swollen areas of skin (hives).  Dizziness or light-headedness.  Fainting.  Trouble breathing, speaking, or swallowing.  Chest tightness.  Rapid heartbeat. How is this diagnosed? A diagnosis is made with a physical exam, medical and family history, and one or more of the following:  Skin tests.  Blood tests.  A food diary.  The results of an elimination diet. The elimination diet involves removing foods from your diet and then adding them back in, one at a time. How is this treated? There is no cure for allergies. An allergic reaction can be treated with medicines, such as:  Antihistamines.  Steroids.  Respiratory inhalers.  Epinephrine. Severe symptoms can be a sign of a life-threatening reaction called anaphylaxis, and they require immediate treatment. Severe reactions usually need to be treated at a hospital. People who have had a severe reaction may  be prescribed rescue medicines to take if they are accidentally exposed to an allergen. Follow these instructions at home: General instructions   Avoid the foods that you are allergic to.  Read food labels before you eat packaged items. Look for ingredients you are allergic to.  When you are at a restaurant, tell your server that you have an allergy. If you are unsure of whether a meal has an ingredient that you are allergic to, ask your server.  Take medicines only as directed by your health care provider. Do not drive until the medicine has worn off, unless your health care provider gives you approval.  Inform all health care providers that you have a food allergy.  Contact your health care provider if you want to be tested for an allergy. If you have had an anaphylactic reaction before, you should never test yourself for an allergy without your health care provider's approval. Instructions for People with Severe Allergies   Wear a medical alert bracelet or necklace that describes your allergy.  Carry your anaphylaxis kit or an epinephrine injection with you at all times. Use them as directed by your health care provider.  Make sure that you, your family members, and your employer know:  How to use an anaphylaxis kit.  How to give an epinephrine injection.  Replace your epinephrine immediately after use, in case you have another reaction.  Seek medical care even after you take epinephrine. This is important because epinephrine can be followed by a delayed, life-threatening reaction.  Instructions for People with a Potential Allergy   Follow the elimination diet as directed by your health care provider.  Keep a food diary as directed by your health care provider. Every day, write down:  What you eat and drink and when.  What symptoms you have and when. Contact a health care provider if:  Your symptoms have not gone away within 2 days.  Your symptoms get worse.  You  develop new symptoms. Get help right away if:  You use epinephrine.  You are having a severe allergic reaction. Symptoms of a severe reaction include:  Swelling of the lips, face, and tongue.  Swelling of the back of the mouth and throat.  Wheezing.  A hoarse voice.  Hives.  Dizziness or light-headedness.  Fainting.  Trouble breathing, speaking, or swallowing.  Chest tightness.  Rapid heartbeat. This information is not intended to replace advice given to you by your health care provider. Make sure you discuss any questions you have with your health care provider. Document Released: 09/19/2000 Document Revised: 02/19/2016 Document Reviewed: 07/04/2014 Elsevier Interactive Patient Education  2017 [employer].

## 2017-01-21 NOTE — Progress Notes (Signed)
Toni Harmon is a 41 y.o. female who presents to [facility] today for asthma exacerbation and food allergy.  Food allergy: Patient has allergy to peanuts. She previously was doing well until about 2 weeks ago when she ate food that she later discover was corrected soybean oil. She developed lip and tongue swelling and vomiting. She improves spontaneously. She thinks that she is not allergic to soy beans. She does not have an EpiPen.  Asthma: Patient has a history of severe chronic asthma. She has maximized her therapy listed below including Claritin and Zyrtec Singulair and Advair and albuterol. She notes over the last 2 weeks worsening wheezing and shortness of breath. She's been using her albuterol inhaler or nebulizer 4 times daily. She notes typically during springtime her asthma gets worse. She thinks at this point she is ready for steroids. Additionally she has FMLA paperwork to be filled out today for her asthma.   Past Medical History:  Diagnosis Date  . Asthma   . GERD (gastroesophageal reflux disease)   . Hypertension    only to combat the effects of hctz   Past Surgical History:  Procedure Laterality Date  . APPENDECTOMY    . DILATION AND CURETTAGE OF UTERUS  12/2011  . TONSILLECTOMY  2003   Social History  Substance Use Topics  . Smoking status: Never Smoker  . Smokeless tobacco: Never Used  . Alcohol use No   family history includes Cervical cancer (age of onset: 71) in her mother; Diabetes in her maternal grandfather; Food Allergy in her mother; Hypertension in her mother.  ROS as above:  Medications: Current Outpatient Prescriptions  Medication Sig Dispense Refill  . albuterol (VENTOLIN HFA) 108 (90 Base) MCG/ACT inhaler INHALE 2 PUFFS INTO THE LUNGS EVERY 4 HOURS AS NEEDED FOR WHEEZING. 18 Inhaler 1  . AMBULATORY NON FORMULARY MEDICATION  Nebulizer with necessary accessories.  Dx: Moderate Persistent Asthma 1 Units 0  . budesonide (RHINOCORT AQUA) 32 MCG/ACT nasal spray Place 2 sprays into both nostrils daily. 8.6 g 11  . cetirizine HCl (ZYRTEC CHILDRENS ALLERGY) 5 MG/5ML SYRP Take 5 mLs (5 mg total) by mouth daily. 300 mL   . Fluticasone-Salmeterol (ADVAIR DISKUS) 500-50 MCG/DOSE AEPB Inhale 1 puff into the lungs 2 (two) times daily. 60 each 5  . loratadine (CLARITIN) 10 MG tablet Take 1 tablet (10 mg total) by mouth daily. 90 tablet 1  . montelukast (SINGULAIR) 10 MG tablet Take 1 tablet (10 mg total) by mouth at bedtime. 30 tablet 3  . Prenatal Vit-Fe Fumarate-FA (PRENATAL MULTIVITAMIN) TABS tablet Take 1 tablet by mouth daily at 12 noon.    . ranitidine (ZANTAC) 150 MG tablet Take 150 mg by mouth daily.    . vitamin C (ASCORBIC ACID) 500 MG tablet Take 500 mg by mouth daily.    Marland Kitchen albuterol (PROVENTIL) (2.5 MG/3ML) 0.083% nebulizer solution Take 3 mLs (2.5 mg total) by nebulization every 6 (six) hours as needed for wheezing or shortness of breath. 150 mL 1  . EPINEPHrine 0.3 mg/0.3 mL IJ SOAJ injection Inject 0.3 mLs (0.3 mg total) into the muscle once. 2 Device 1  . ibuprofen (ADVIL,MOTRIN) 600 MG tablet TAKE 1 TABLET EVERY 6 HOURS NOT TO EXCEED  DAILY  0  . predniSONE (STERAPRED UNI-PAK 48 TAB) 5 MG (48) TBPK tablet 12 day dosepack po 48 tablet 0   No current facility-administered medications for this visit.    Allergies  Allergen Reactions  . Soybean Oil Anaphylaxis  . Compazine [Prochlorperazine Edisylate]     seizure  . Eggs Or Egg-Derived Products   . Other     Tree nuts, melon, gluten intolerance, eggs  . Peanut-Containing Drug Products   . Ppd [Tuberculin Purified Protein Derivative]     Allergic reaction  . Latex Hives and Rash    Health Maintenance Health Maintenance  Topic Date Due  . TETANUS/TDAP  09/02/2017 (Originally 03/28/1996)  . INFLUENZA VACCINE  09/02/2026 (Originally 05/06/2017)  . PAP  SMEAR  03/18/2018  . HIV Screening  Completed     Exam:  BP 116/78   Pulse 73   Temp 97.6 F (36.4 C) (Oral)   Wt 141 lb (64 kg)   SpO2 100%   BMI 22.76 kg/m  Gen: Well NAD nontoxic appearing HEENT: EOMI,  MMM no lip swelling Lungs: Normal work of breathing. Occasional wheezing present bilaterally Heart: RRR no MRG Abd: NABS, Soft. Nondistended, Nontender Exts: Brisk capillary refill, warm and well perfused.    No results found for this or any previous visit (from the past 72 hour(s)). No results found.    Assessment and Plan: 41 y.o. female with  Asthma: Worsening. Plan for prednisone Dosepak. Continue albuterol and other medications. Watchful waiting. If this keeps happening may consider referral to asthma/allergy specialist.  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 This makes sense given her allergy to peanuts which are related. Plan to avoid Soy or peanut-containing products and prescribe an Epi-Pen PRN.    No orders of the defined types were placed in this encounter.  Meds ordered this encounter  Medications  . ibuprofen (ADVIL,MOTRIN) 600 MG tablet    Sig: TAKE 1 TABLET EVERY 6 HOURS NOT TO EXCEED  DAILY    Refill:  0  . EPINEPHrine 0.3 mg/0.3 mL IJ SOAJ injection    Sig: Inject 0.3 mLs (0.3 mg total) into the muscle once.    Dispense:  2 Device    Refill:  1  . predniSONE (STERAPRED UNI-PAK 48 TAB) 5 MG (48) TBPK tablet    Sig: 12 day dosepack po    Dispense:  48 tablet    Refill:  0  . albuterol (PROVENTIL) (2.5 MG/3ML) 0.083% nebulizer solution    Sig: Take 3 mLs (2.5 mg total) by nebulization every 6 (six) hours as needed for wheezing or shortness of breath.    Dispense:  150 mL    Refill:  1     Discussed warning signs or symptoms. Please see discharge instructions. Patient expresses understanding.

## 2017-02-10 ENCOUNTER — Other Ambulatory Visit: Payer: Self-pay

## 2017-02-10 MED ORDER — MONTELUKAST SODIUM 10 MG PO TABS: 10.0000 mg | ORAL_TABLET | Freq: Every day | ORAL | 3 refills | Status: DC

## 2017-03-19 DIAGNOSIS — F4323 Adjustment disorder with mixed anxiety and depressed mood: Secondary | ICD-10-CM | POA: Diagnosis not present

## 2017-03-24 ENCOUNTER — Emergency Department (HOSPITAL_BASED_OUTPATIENT_CLINIC_OR_DEPARTMENT_OTHER)
Admission: EM | Admit: 2017-03-24 | Discharge: 2017-03-24 | Disposition: A | Discharge status: Home / Self Care | Payer: Federal, State, Local not specified - PPO | Attending: Physician Assistant | Admitting: Physician Assistant

## 2017-03-24 ENCOUNTER — Encounter (HOSPITAL_BASED_OUTPATIENT_CLINIC_OR_DEPARTMENT_OTHER): Payer: Self-pay | Admitting: *Deleted

## 2017-03-24 DIAGNOSIS — I10 Essential (primary) hypertension: Secondary | ICD-10-CM | POA: Insufficient documentation

## 2017-03-24 DIAGNOSIS — M542 Cervicalgia: Secondary | ICD-10-CM | POA: Insufficient documentation

## 2017-03-24 DIAGNOSIS — Z91010 Allergy to peanuts: Secondary | ICD-10-CM | POA: Insufficient documentation

## 2017-03-24 DIAGNOSIS — R51 Headache: Secondary | ICD-10-CM | POA: Diagnosis not present

## 2017-03-24 DIAGNOSIS — J45909 Unspecified asthma, uncomplicated: Secondary | ICD-10-CM | POA: Diagnosis not present

## 2017-03-24 DIAGNOSIS — Z91040 Latex allergy status: Secondary | ICD-10-CM | POA: Insufficient documentation

## 2017-03-24 DIAGNOSIS — M25511 Pain in right shoulder: Secondary | ICD-10-CM | POA: Diagnosis not present

## 2017-03-24 DIAGNOSIS — Z79899 Other long term (current) drug therapy: Secondary | ICD-10-CM | POA: Insufficient documentation

## 2017-03-24 DIAGNOSIS — M25512 Pain in left shoulder: Secondary | ICD-10-CM | POA: Insufficient documentation

## 2017-03-24 MED ORDER — LIDOCAINE 5 % EX PTCH: 1.0000 | MEDICATED_PATCH | CUTANEOUS | 0 refills | Status: DC

## 2017-03-24 MED ORDER — METHOCARBAMOL 500 MG PO TABS: 500.0000 mg | ORAL_TABLET | Freq: Two times a day (BID) | ORAL | 0 refills | Status: DC

## 2017-03-24 MED ORDER — ONDANSETRON 4 MG PO TBDP: 4.0000 mg | ORAL_TABLET | Freq: Three times a day (TID) | ORAL | 0 refills | Status: DC | PRN

## 2017-03-24 MED ORDER — IBUPROFEN 600 MG PO TABS: 600.0000 mg | ORAL_TABLET | Freq: Four times a day (QID) | ORAL | 0 refills | Status: DC | PRN

## 2017-03-24 NOTE — ED Notes (Signed)
Pt teaching provided on medications that may cause drowsiness. Pt instructed not to drive or operate heavy machinery while taking the prescribed medication. Pt verbalized understanding.   
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
## 2017-03-24 NOTE — Discharge Instructions (Signed)
Expect your soreness to increase over the next 2-3 days. Take it easy, but do not lay around too much as this may make any stiffness worse.  Antiinflammatory medications: Take 600 mg of ibuprofen every 6 hours or 440 mg (over the counter dose) to 500 mg (prescription dose) of naproxen every 12 hours or for the next 3 days. After this time, these medications may be used as needed for pain. Take these medications with food to avoid upset stomach. Choose only one of these medications, do not take them together.   Muscle relaxer: Robaxin is a muscle relaxer and may help loosen stiff muscles. Do not take the Robaxin while driving or performing other dangerous activities.   Lidocaine patches: These are available via either prescription or over-the-counter. The over-the-counter option may be more economical one and are likely just as effective. There are multiple over-the-counter brands, such as Salonpas.  Exercises: Be sure to perform the attached exercises starting with three times a week and working up to performing them daily. This is an essential part of preventing long term problems.   Concussion: You may have a minor concussion, but there are no signs of serious head injury at this time. Please refer to the attached literature for further information. Stay well hydrated. Get plenty of rest and quality sleep. Zofran for nausea.  Follow up with a primary care provider for any future management of these complaints.
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
## 2017-03-31 DIAGNOSIS — F4323 Adjustment disorder with mixed anxiety and depressed mood: Secondary | ICD-10-CM | POA: Diagnosis not present

## 2017-04-09 DIAGNOSIS — F4310 Post-traumatic stress disorder, unspecified: Secondary | ICD-10-CM | POA: Diagnosis not present

## 2017-04-16 DIAGNOSIS — F4310 Post-traumatic stress disorder, unspecified: Secondary | ICD-10-CM | POA: Diagnosis not present

## 2017-04-21 DIAGNOSIS — F4310 Post-traumatic stress disorder, unspecified: Secondary | ICD-10-CM | POA: Diagnosis not present

## 2017-04-28 DIAGNOSIS — F4310 Post-traumatic stress disorder, unspecified: Secondary | ICD-10-CM | POA: Diagnosis not present

## 2017-05-18 ENCOUNTER — Other Ambulatory Visit: Payer: Self-pay

## 2017-05-18 MED ORDER — MONTELUKAST SODIUM 10 MG PO TABS: 10.0000 mg | ORAL_TABLET | Freq: Every day | ORAL | 3 refills | Status: DC

## 2017-05-19 DIAGNOSIS — F4310 Post-traumatic stress disorder, unspecified: Secondary | ICD-10-CM | POA: Diagnosis not present

## 2017-05-26 ENCOUNTER — Other Ambulatory Visit: Payer: Self-pay

## 2017-05-26 DIAGNOSIS — J4540 Moderate persistent asthma, uncomplicated: Secondary | ICD-10-CM

## 2017-05-26 DIAGNOSIS — F4310 Post-traumatic stress disorder, unspecified: Secondary | ICD-10-CM | POA: Diagnosis not present

## 2017-05-26 MED ORDER — ALBUTEROL SULFATE HFA 108 (90 BASE) MCG/ACT IN AERS: INHALATION_SPRAY | RESPIRATORY_TRACT | 11 refills | Status: DC

## 2017-05-27 DIAGNOSIS — L600 Ingrowing nail: Secondary | ICD-10-CM | POA: Diagnosis not present

## 2017-05-27 DIAGNOSIS — M2140 Flat foot [pes planus] (acquired), unspecified foot: Secondary | ICD-10-CM | POA: Diagnosis not present

## 2017-05-27 DIAGNOSIS — M21611 Bunion of right foot: Secondary | ICD-10-CM | POA: Diagnosis not present

## 2017-06-09 DIAGNOSIS — F4310 Post-traumatic stress disorder, unspecified: Secondary | ICD-10-CM | POA: Diagnosis not present

## 2017-06-16 DIAGNOSIS — F4310 Post-traumatic stress disorder, unspecified: Secondary | ICD-10-CM | POA: Diagnosis not present

## 2017-06-22 ENCOUNTER — Ambulatory Visit (INDEPENDENT_AMBULATORY_CARE_PROVIDER_SITE_OTHER): Payer: Federal, State, Local not specified - PPO | Admitting: Podiatry

## 2017-06-22 DIAGNOSIS — B351 Tinea unguium: Secondary | ICD-10-CM

## 2017-06-22 DIAGNOSIS — L600 Ingrowing nail: Secondary | ICD-10-CM

## 2017-06-22 MED ORDER — CEPHALEXIN 500 MG PO CAPS: 500.0000 mg | ORAL_CAPSULE | Freq: Three times a day (TID) | ORAL | 0 refills | Status: DC

## 2017-06-22 NOTE — Patient Instructions (Signed)

## 2017-06-23 DIAGNOSIS — F4310 Post-traumatic stress disorder, unspecified: Secondary | ICD-10-CM | POA: Diagnosis not present

## 2017-06-24 NOTE — Progress Notes (Signed)
Subjective: Toni Harmon presents the office they for 2 concerns. She initially presents today for although the nail fungus. She says the area of the nails did well however the left hallux and second digit toenails are still thick and somewhat discolored. She also states that she has ingrown toenails to both of her big toes initially to have a procedure noted to get rid of the ingrown nails. She states that she was seen by the [facility] hospital that she did not want have them do the procedure. She states the nails are painful to pressure in shoes. She states that his infection and we can her up at night due to the pain. She denies any drainage or pus and she denies any redness or drainage or any swelling. Denies any systemic complaints such as fevers, chills, nausea, vomiting. No acute changes since last appointment, and no other complaints at this time.   Objective: AAO x3, NAD DP/PT pulses palpable bilaterally, CRT less than 3 seconds Left hallux and second digit toenail is dystrophic, discolored yellow discoloration.  On bilateral hallux nails is incurvation of both medial and lateral aspect of the nail border. There is apparent significant incurvation along both the medial and lateral nail borders proximally. There is tenderness directly on these areas. There is no erythema or ascending cellulitis. There is no drainage or pus. No open lesions or pre-ulcerative lesions.  No pain with calf compression, swelling, warmth, erythema  Assessment: Ingrown toenails bilateral hallux and onychodystrophy/onychomycosis left hallux and second digit toenails  Plan: -All treatment options discussed with the patient including all alternatives, risks, complications.  -Etiology of symptoms were discussed -At this time, the patient is requesting partial nail removal with chemical matricectomy to the symptomatic portion of the nail. Risks and complications were discussed with the patient for which they understand and   verbally consent to the procedure. Under sterile conditions a total of 3 mL of a mixture of 2% lidocaine plain and 0.5% Marcaine plain was infiltrated in a hallux block fashion. Once anesthetized, the skin was prepped in sterile fashion. A tourniquet was then applied. Next the medial and lateral aspect of hallux nail border was then sharply excised making sure to remove the entire offending nail border. Once the nails were ensured to be removed area was debrided and the underlying skin was intact. There is no purulence identified in the procedure. Next phenol was then applied under standard conditions and copiously irrigated. Silvadene was applied. A dry sterile dressing was applied. After application of the dressing the tourniquet was removed and there is found to be an immediate capillary refill time to the digit. The patient tolerated the procedure well any complications. Post procedure instructions were discussed the patient for which he verbally understood. Follow-up in one week for nail check or sooner if any problems are to arise. Discussed signs/symptoms of infection and directed to call the office immediately should any occur or go directly to the emergency room. In the meantime, encouraged to call the office with any questions, concerns, changes symptoms. -Likely do a urea cream for the left hallux and second digit toenail once this heals.  Ovid Curd, DPM

## 2017-06-30 ENCOUNTER — Encounter: Payer: Self-pay | Admitting: Podiatry

## 2017-06-30 ENCOUNTER — Ambulatory Visit (INDEPENDENT_AMBULATORY_CARE_PROVIDER_SITE_OTHER): Payer: Federal, State, Local not specified - PPO | Admitting: Podiatry

## 2017-06-30 DIAGNOSIS — L600 Ingrowing nail: Secondary | ICD-10-CM

## 2017-06-30 DIAGNOSIS — F4310 Post-traumatic stress disorder, unspecified: Secondary | ICD-10-CM | POA: Diagnosis not present

## 2017-06-30 DIAGNOSIS — M79676 Pain in unspecified toe(s): Secondary | ICD-10-CM

## 2017-06-30 NOTE — Progress Notes (Signed)
Subjective: Toni Harmon is a 41 y.o.  female returns to office today for follow up evaluation after having bilateral hallux medial and lateral partial nail avulsion performed. Patient has been soaking using epsom salts and applying topical antibiotic covered with bandaid daily. Denies any drainage or pus. She has been having some pain to the area tooth versus she can wear shoes. She did finish the antibiotic's. Patient denies fevers, chills, nausea, vomiting. Denies any calf pain, chest pain, SOB.   Objective:  Vitals: Reviewed  General: Well developed, nourished, in no acute distress, alert and oriented x3   Dermatology: Skin is warm, dry and supple bilateral. Bilateral hallux nail border appears to be clean, dry, with mild granular tissue and surrounding scab. There is no surrounding erythema, edema, drainage/purulence. The remaining nails appear unremarkable at this time. There are no other lesions or other signs of infection present.  Neurovascular status: Intact. No lower extremity swelling; No pain with calf compression bilateral.  Musculoskeletal: Decreasedtenderness to palpation of the medial and lateral hallux nail folds. Muscular strength within normal limits bilateral.   Assesement and Plan: S/p partial nail avulsion, doing well with improving pain   -Continue soaking in epsom salts twice a day followed by antibiotic ointment and a band-aid. Can leave uncovered at night. Continue this until completely healed.  -If the area has not healed in 2 weeks, call the office for follow-up appointment, or sooner if any problems arise.  -Monitor for any signs/symptoms of infection. Call the office immediately if any occur or go directly to the emergency room. Call with any questions/concerns.  Note provided to go back to work tomorrow. She was out of work last week and she has FMLA paperwork.   Ovid Curd, DPM

## 2017-06-30 NOTE — Patient Instructions (Signed)

## 2017-07-01 ENCOUNTER — Telehealth: Payer: Self-pay | Admitting: Podiatry

## 2017-07-01 NOTE — Telephone Encounter (Signed)
I saw Dr. Ardelle Anton for a follow up from the ingrown nail procedure yesterday. I was supposed to stay out of work through today. I know have this big pain lump that's over one of the veins on my left foot. You can reach me at [phone]. Thank you.

## 2017-07-01 NOTE — Telephone Encounter (Signed)
Spoke with patient regarding painful lump on the bottom of her foot. She states that she has noticed a lump on the bottom of her foot close to the ball of the foot that is extremely painful and its causing her to have difficulties walking. Advised her to follow up with Dr Ardelle Anton or one of our other doctors for evaluation. Transferred to schedulers

## 2017-07-02 ENCOUNTER — Ambulatory Visit (INDEPENDENT_AMBULATORY_CARE_PROVIDER_SITE_OTHER): Payer: Federal, State, Local not specified - PPO | Admitting: Podiatry

## 2017-07-02 ENCOUNTER — Encounter: Payer: Self-pay | Admitting: Podiatry

## 2017-07-02 ENCOUNTER — Ambulatory Visit (INDEPENDENT_AMBULATORY_CARE_PROVIDER_SITE_OTHER): Payer: Federal, State, Local not specified - PPO

## 2017-07-02 DIAGNOSIS — L600 Ingrowing nail: Secondary | ICD-10-CM

## 2017-07-02 DIAGNOSIS — M722 Plantar fascial fibromatosis: Secondary | ICD-10-CM

## 2017-07-02 NOTE — Progress Notes (Signed)
Subjective: Toni Harmon presents the office today for new concerns. She has a small painful bump on the bottom of her right foot which started yesterday. She states the areas painful to walk on. She denies stepping on any foreign objects and she has been taking some anti-inflammatories and his significant improvement. She states the tenderness or doing well she's not having any issues the pain is very much better. However due to the patient on the foot she cannot return to work. She denies any swelling or redness that she has noticed. She has no new concerns otherwise. Denies any systemic complaints such as fevers, chills, nausea, vomiting. No acute changes since last appointment, and no other complaints at this time.   Objective: AAO x3, NAD DP/PT pulses palpable bilaterally, CRT less than 3 seconds Ingrown toenail procedures appear to be healing well. Scab a informed procedure site and there is no drainage or pus there is no edema, erythema. There is decreased tenderness to palpation on the procedure sites. On the right foot along the medial aspect proximal to the metatarsal heads is a very small fluid-filled mobile soft tissue mass of tenderness to palpation. There is no overlying erythema, edema or signs of infection. No other lesions identified bilaterally  No open lesions or pre-ulcerative lesions.  No pain with calf compression, swelling, warmth, erythema  Assessment: Right foot soft tissue mass healing procedure sites. No signs of infection.  Plan: -All treatment options discussed with the patient including all alternatives, risks, complications.  -X-rays were obtained and reviewed. No evidence of foreign body, fracture or calcification. -Discussed assistant with a steroid injection which she wishes to hold off on this. I prescribed meloxicam, the pain as well as offloading pads. She is also a Lukens a more natural topicals that she can use for the area. Dispensed offloading pads. -Regards the  toenail she is doing well. It appeared to be healing there is no signs of infection. Continue soaking in Epson salts daily and cover with antibiotic 1 intervention the day. Continue the area uncovered at night. -Will plan return to work on Monday. -Follow-up in 2 weeks if needed or sooner. She is unsure arise. -Patient encouraged to call the office with any questions, concerns, change in symptoms.   Ovid Curd, DPM

## 2017-07-07 DIAGNOSIS — F4310 Post-traumatic stress disorder, unspecified: Secondary | ICD-10-CM | POA: Diagnosis not present

## 2017-07-16 ENCOUNTER — Ambulatory Visit: Payer: Federal, State, Local not specified - PPO | Admitting: Podiatry

## 2017-07-21 DIAGNOSIS — F4310 Post-traumatic stress disorder, unspecified: Secondary | ICD-10-CM | POA: Diagnosis not present

## 2017-07-28 DIAGNOSIS — F4310 Post-traumatic stress disorder, unspecified: Secondary | ICD-10-CM | POA: Diagnosis not present

## 2017-09-24 ENCOUNTER — Ambulatory Visit: Payer: Federal, State, Local not specified - PPO | Admitting: Family Medicine

## 2017-09-24 ENCOUNTER — Encounter: Payer: Self-pay | Admitting: Family Medicine

## 2017-09-24 VITALS — BP 127/84 | HR 70 | Ht 66.0 in | Wt 134.0 lb

## 2017-09-24 DIAGNOSIS — J4541 Moderate persistent asthma with (acute) exacerbation: Secondary | ICD-10-CM | POA: Diagnosis not present

## 2017-09-24 DIAGNOSIS — J4540 Moderate persistent asthma, uncomplicated: Secondary | ICD-10-CM

## 2017-09-24 MED ORDER — GUAIFENESIN-CODEINE 100-10 MG/5ML PO SOLN: 5.0000 mL | Freq: Four times a day (QID) | ORAL | 0 refills | Status: DC | PRN

## 2017-09-24 MED ORDER — ALBUTEROL SULFATE HFA 108 (90 BASE) MCG/ACT IN AERS: INHALATION_SPRAY | RESPIRATORY_TRACT | 11 refills | Status: DC

## 2017-09-24 MED ORDER — PREDNISONE 5 MG (48) PO TBPK: ORAL_TABLET | ORAL | 0 refills | Status: DC

## 2017-09-24 MED ORDER — ALBUTEROL SULFATE (2.5 MG/3ML) 0.083% IN NEBU: 2.5000 mg | INHALATION_SOLUTION | Freq: Four times a day (QID) | RESPIRATORY_TRACT | 1 refills | Status: DC | PRN

## 2017-09-24 NOTE — Patient Instructions (Addendum)
Thank you for coming in today. Use albuterol as needed.  Take prednisone now.  Use codeine cough medicine at bed time as needed for cough.  DO not take this medicine and drive or work.

## 2017-09-25 NOTE — Progress Notes (Signed)
Toni Harmon is a 41 y.o. female [facility] today for cough congestion and wheezing. Toni Harmon notes shortness of breath and wheezing.  This is been ongoing for a few weeks.  She has been using her albuterol inhaler more frequently.  She feels as though she is in the process of having an asthma exacerbation and thinks that she is probably ready for prednisone.  She notes that she often has coughing especially at bedtime and interferes sleep as well.  She is tried some over-the-counter medications for this which have not helped much.   Past Medical History:  Diagnosis Date  . Asthma   . GERD (gastroesophageal reflux disease)   . Hypertension    only to combat the effects of hctz   Past Surgical History:  Procedure Laterality Date  . APPENDECTOMY    . DILATION AND CURETTAGE OF UTERUS  12/2011  . TONSILLECTOMY  2003  . TOOTH EXTRACTION     Social History   Tobacco Use  . Smoking status: Never Smoker  . Smokeless tobacco: Never Used  Substance Use Topics  . Alcohol use: No    Alcohol/week: 0.0 oz   family history includes Cervical cancer (age of onset: 5745) in her mother; Diabetes in her maternal grandfather; Food Allergy in her mother; Hypertension in her mother.  ROS as above:  Medications: Current Outpatient Medications  Medication Sig Dispense Refill  . albuterol (PROVENTIL) (2.5 MG/3ML) 0.083% nebulizer solution Take 3 mLs (2.5 mg total) by nebulization every 6 (six) hours as needed for wheezing or shortness of breath. 150 mL 1  . albuterol (VENTOLIN HFA) 108 (90 Base) MCG/ACT inhaler INHALE 2 PUFFS INTO THE LUNGS EVERY 4 HOURS AS NEEDED FOR WHEEZING. 18 Inhaler 11  . AMBULATORY NON FORMULARY MEDICATION Nebulizer with necessary accessories.  Dx: Moderate Persistent Asthma 1 Units 0  . budesonide (RHINOCORT AQUA) 32 MCG/ACT nasal spray  Place 2 sprays into both nostrils daily. 8.6 g 11  . cephALEXin (KEFLEX) 500 MG capsule Take 1 capsule (500 mg total) by mouth 3 (three) times daily. 21 capsule 0  . cetirizine HCl (ZYRTEC CHILDRENS ALLERGY) 5 MG/5ML SYRP Take 5 mLs (5 mg total) by mouth daily. 300 mL   . CLINDAMYCIN HCL PO Take by mouth.    . Fluticasone-Salmeterol (ADVAIR DISKUS) 500-50 MCG/DOSE AEPB Inhale 1 puff into the lungs 2 (two) times daily. 60 each 5  . ibuprofen (ADVIL,MOTRIN) 600 MG tablet TAKE 1 TABLET EVERY 6 HOURS NOT TO EXCEED 3200MG  DAILY  0  . ibuprofen (ADVIL,MOTRIN) 600 MG tablet Take 1 tablet (600 mg total) by mouth every 6 (six) hours as needed. 30 tablet 0  . lidocaine (LIDODERM) 5 % Place 1 patch onto the skin daily. Remove & Discard patch within 12 hours or as directed by MD 30 patch 0  . loratadine (CLARITIN) 10 MG tablet Take 1 tablet (10 mg total) by mouth daily. 90 tablet 1  . methocarbamol (ROBAXIN) 500 MG tablet Take 1 tablet (500 mg total) by mouth 2 (two) times daily. 20 tablet 0  . montelukast (SINGULAIR) 10 MG tablet Take 1 tablet (10 mg total) by mouth at bedtime. 90 tablet 3  . ondansetron (ZOFRAN ODT) 4 MG disintegrating tablet Take 1 tablet (4 mg total) by mouth every 8 (eight) hours as needed for nausea or vomiting. 20 tablet 0  . predniSONE (STERAPRED UNI-PAK 48 TAB) 5 MG (48) TBPK tablet 12 day  dosepack po 48 tablet 0  . Prenatal Vit-Fe Fumarate-FA (PRENATAL MULTIVITAMIN) TABS tablet Take 1 tablet by mouth daily at 12 noon.    . ranitidine (ZANTAC) 150 MG tablet Take 150 mg by mouth daily.    . vitamin C (ASCORBIC ACID) 500 MG tablet Take 500 mg by mouth daily.    Marland Kitchen. guaiFENesin-codeine 100-10 MG/5ML syrup Take 5 mLs by mouth every 6 (six) hours as needed for cough. 120 mL 0   No current facility-administered medications for this visit.    Allergies  Allergen Reactions  . Soybean Oil Anaphylaxis  . Compazine [Prochlorperazine Edisylate]     seizure  . Eggs Or Egg-Derived Products     . Other     Tree nuts, melon, gluten intolerance, eggs  . Peanut-Containing Drug Products   . Ppd [Tuberculin Purified Protein Derivative]     Allergic reaction  . Latex Hives and Rash    Health Maintenance Health Maintenance  Topic Date Due  . TETANUS/TDAP  03/28/1996  . INFLUENZA VACCINE  09/02/2026 (Originally 05/06/2017)  . PAP SMEAR  03/18/2018  . HIV Screening  Completed     Exam:  BP 127/84   Pulse 70   Ht 5\' 6"  (1.676 m)   Wt 134 lb (60.8 kg)   SpO2 99%   BMI 21.63 kg/m  Gen: Well NAD HEENT: EOMI,  MMM clear nasal discharge. Lungs: Normal work of breathing.  Slight wheezing and prolonged breath sounds present bilaterally. Heart: RRR no MRG Abd: NABS, Soft. Nondistended, Nontender Exts: Brisk capillary refill, warm and well perfused.    No results found for this or any previous visit (from the past 72 hour(s)). No results found.    Assessment and Plan: 41 y.o. female with asthma exacerbation.  Plan to treat with prednisone Dosepak albuterol inhaler and nebulized solution as well as codeine cough syrup.  Continue baseline allergy and asthma medications.   No orders of the defined types were placed in this encounter.  Meds ordered this encounter  Medications  . predniSONE (STERAPRED UNI-PAK 48 TAB) 5 MG (48) TBPK tablet    Sig: 12 day dosepack po    Dispense:  48 tablet    Refill:  0  . albuterol (VENTOLIN HFA) 108 (90 Base) MCG/ACT inhaler    Sig: INHALE 2 PUFFS INTO THE LUNGS EVERY 4 HOURS AS NEEDED FOR WHEEZING.    Dispense:  18 Inhaler    Refill:  11    Pt failed proair trial.  . guaiFENesin-codeine 100-10 MG/5ML syrup    Sig: Take 5 mLs by mouth every 6 (six) hours as needed for cough.    Dispense:  120 mL    Refill:  0  . albuterol (PROVENTIL) (2.5 MG/3ML) 0.083% nebulizer solution    Sig: Take 3 mLs (2.5 mg total) by nebulization every 6 (six) hours as needed for wheezing or shortness of breath.    Dispense:  150 mL    Refill:  1      Discussed warning signs or symptoms. Please see discharge instructions. Patient expresses understanding.

## 2017-10-26 ENCOUNTER — Encounter: Payer: Self-pay | Admitting: Family Medicine

## 2017-10-26 ENCOUNTER — Ambulatory Visit: Payer: Federal, State, Local not specified - PPO | Admitting: Family Medicine

## 2017-10-26 VITALS — BP 113/79 | HR 64 | Ht 66.0 in | Wt 139.0 lb

## 2017-10-26 DIAGNOSIS — J4541 Moderate persistent asthma with (acute) exacerbation: Secondary | ICD-10-CM | POA: Diagnosis not present

## 2017-10-26 MED ORDER — PREDNISONE 5 MG (48) PO TBPK: ORAL_TABLET | ORAL | 0 refills | Status: DC

## 2017-10-26 NOTE — Patient Instructions (Signed)
Thank you for coming in today. Take the prednisone Yo should hear from Pulmonology.  I think the Tdap and Egg Free flu shots are ok.  We should do those one at a time when you are well and observer for for several hours.

## 2017-10-26 NOTE — Progress Notes (Signed)
Toni Harmon is a 42 y.o. female who presents to [facility]: Primary Care Sports Medicine today for wheezing cough shortness of breath.  Toni Harmon notes a several day history of worsening cough and wheezing occurring at night.  She thinks dust exposure at work is probably causing her symptoms.  She has been using her albuterol inhaler and nebulizer with increased frequency.  She has been also continuing to use her Advair montelukast and antihistamines.  She notes multiple episodes of asthma exacerbations per year and notes her symptoms are consistent with asthma exacerbation and she thinks that she is probably ready for steroids at this point.   Past Medical History:  Diagnosis Date  . Asthma   . GERD (gastroesophageal reflux disease)   . Hypertension    only to combat the effects of hctz   Past Surgical History:  Procedure Laterality Date  . APPENDECTOMY    . DILATION AND CURETTAGE OF UTERUS  12/2011  . TONSILLECTOMY  2003  . TOOTH EXTRACTION     Social History   Tobacco Use  . Smoking status: Never Smoker  . Smokeless tobacco: Never Used  Substance Use Topics  . Alcohol use: No    Alcohol/week: 0.0 oz   family history includes Cervical cancer (age of onset: 33) in her mother; Diabetes in her maternal grandfather; Food Allergy in her mother; Hypertension in her mother.  ROS as above:  Medications: Current Outpatient Medications  Medication Sig Dispense Refill  . albuterol (PROVENTIL) (2.5 MG/3ML) 0.083% nebulizer solution Take 3 mLs (2.5 mg total) by nebulization every 6 (six) hours as needed for wheezing or shortness of breath. 150 mL 1  . albuterol (VENTOLIN HFA) 108 (90 Base) MCG/ACT inhaler INHALE 2 PUFFS INTO THE LUNGS EVERY 4 HOURS AS NEEDED FOR WHEEZING. 18 Inhaler 11  . AMBULATORY NON FORMULARY MEDICATION Nebulizer with necessary accessories.  Dx: Moderate Persistent Asthma 1  Units 0  . budesonide (RHINOCORT AQUA) 32 MCG/ACT nasal spray Place 2 sprays into both nostrils daily. 8.6 g 11  . cetirizine HCl (ZYRTEC CHILDRENS ALLERGY) 5 MG/5ML SYRP Take 5 mLs (5 mg total) by mouth daily. 300 mL   . Fluticasone-Salmeterol (ADVAIR DISKUS) 500-50 MCG/DOSE AEPB Inhale 1 puff into the lungs 2 (two) times daily. 60 each 5  . ibuprofen (ADVIL,MOTRIN) 600 MG tablet 2 (two) times daily as needed.  0  . loratadine (CLARITIN) 10 MG tablet Take 1 tablet (10 mg total) by mouth daily. 90 tablet 1  . montelukast (SINGULAIR) 10 MG tablet Take 1 tablet (10 mg total) by mouth at bedtime. 90 tablet 3  . predniSONE (STERAPRED UNI-PAK 48 TAB) 5 MG (48) TBPK tablet 12 day dosepack po 48 tablet 0  . Prenatal Vit-Fe Fumarate-FA (PRENATAL MULTIVITAMIN) TABS tablet Take 1 tablet by mouth daily at 12 noon.    . vitamin C (ASCORBIC ACID) 500 MG tablet Take 500 mg by mouth daily.     No current facility-administered medications for this visit.    Allergies  Allergen Reactions  . Soybean Oil Anaphylaxis  . Compazine [Prochlorperazine Edisylate]     seizure  . Eggs Or Egg-Derived Products   . Other     Tree nuts, melon, gluten intolerance, eggs  . Peanut-Containing Drug Products   . Ppd [Tuberculin Purified Protein Derivative]     Allergic reaction  . Latex Hives and Rash    Health Maintenance Health Maintenance  Topic Date Due  . TETANUS/TDAP  03/28/1996  .  INFLUENZA VACCINE  09/02/2026 (Originally 05/06/2017)  . PAP SMEAR  03/18/2018  . HIV Screening  Completed     Exam:  BP 113/79   Pulse 64   Ht 5\' 6"  (1.676 m)   Wt 139 lb (63 kg)   SpO2 100%   BMI 22.44 kg/m  Gen: Well NAD HEENT: EOMI,  MMM Lungs: Normal work of breathing. CTABL Heart: RRR no MRG Abd: NABS, Soft. Nondistended, Nontender Exts: Brisk capillary refill, warm and well perfused.    No results found for this or any previous visit (from the past 72 hour(s)). No results found.    Assessment and Plan: 42  y.o. female with asthma exacerbation.  Occupational dust exposure is a contribution factor here very likely.  Plan to continue current asthma plan as well as starting prednisone.  I recommend follow-up with pulmonology to see if she can maximize her therapy.  She may be ready for the monoclonal antibody asthma treatment.  We also discussed changing the and 95 mask respirator at work more frequently and will recheck as needed.  Additionally we discussed safety of Tdap and flu vaccines.  She has a significant egg allergy.  I think the Tdap and the egg free flu vaccine are probably safe for her.  I recommend she reschedule for supervised administration of these vaccines with observation afterwards when she is feeling better.   Orders Placed This Encounter  Procedures  . Ambulatory referral to Pulmonology    Referral Priority:   Routine    Referral Type:   Consultation    Referral Reason:   Specialty Services Required    Requested Specialty:   Pulmonary Disease    Number of Visits Requested:   1   Meds ordered this encounter  Medications  . predniSONE (STERAPRED UNI-PAK 48 TAB) 5 MG (48) TBPK tablet    Sig: 12 day dosepack po    Dispense:  48 tablet    Refill:  0     Discussed warning signs or symptoms. Please see discharge instructions. Patient expresses understanding.

## 2017-12-25 ENCOUNTER — Encounter: Payer: Self-pay | Admitting: Family Medicine

## 2017-12-25 ENCOUNTER — Ambulatory Visit: Payer: Federal, State, Local not specified - PPO | Admitting: Family Medicine

## 2017-12-25 VITALS — BP 129/83 | HR 88 | Ht 66.0 in | Wt 139.0 lb

## 2017-12-25 DIAGNOSIS — Z114 Encounter for screening for human immunodeficiency virus [HIV]: Secondary | ICD-10-CM | POA: Diagnosis not present

## 2017-12-25 DIAGNOSIS — Z8049 Family history of malignant neoplasm of other genital organs: Secondary | ICD-10-CM | POA: Diagnosis not present

## 2017-12-25 DIAGNOSIS — E559 Vitamin D deficiency, unspecified: Secondary | ICD-10-CM

## 2017-12-25 DIAGNOSIS — Z1231 Encounter for screening mammogram for malignant neoplasm of breast: Secondary | ICD-10-CM

## 2017-12-25 DIAGNOSIS — D649 Anemia, unspecified: Secondary | ICD-10-CM

## 2017-12-25 DIAGNOSIS — I10 Essential (primary) hypertension: Secondary | ICD-10-CM | POA: Diagnosis not present

## 2017-12-25 DIAGNOSIS — Z1239 Encounter for other screening for malignant neoplasm of breast: Secondary | ICD-10-CM

## 2017-12-25 DIAGNOSIS — Z113 Encounter for screening for infections with a predominantly sexual mode of transmission: Secondary | ICD-10-CM

## 2017-12-25 DIAGNOSIS — J4541 Moderate persistent asthma with (acute) exacerbation: Secondary | ICD-10-CM | POA: Diagnosis not present

## 2017-12-25 NOTE — Progress Notes (Signed)
WET

## 2017-12-25 NOTE — Progress Notes (Signed)
Toni Harmon is a 42 y.o. female who presents to [name]: Primary Care Sports Medicine today for Asthma FMLA clarification and discuss anemia, hypertension, vitamin D deficiency, STD screening.  Toni Harmon has moderate persistent to occasionally severe persistent asthma.  This is also associated with seasonal allergies this is co-managed with a pulmonologist at the [place]. she currently uses Advair, montelukast, cetirizine, Claritin, and Rhinocort nasal spray.  She uses albuterol occasionally.  She notes that she is been a little worse recently with the springtime allergies but is not experiencing severe shortness of breath or wheezing and does not think that she is at risk for severe exacerbation.  She has a clarification for her FMLA paperwork that she would like corrected today which will be scanned into the chart.  Otherwise she thinks she is about at her baseline.  Anemia: Toni Harmon has a history of iron deficiency anemia due to heavy menstrual bleeding and poor iron intake.  She last had this checked about a year ago.  At that time her hemoglobin was within normal limits and her percent saturation was 15%.  She is not currently taking oral iron but has done in the past.  She denies significant fatigue or feeling too hot or cold or pica.  Hypertension: Toni Harmon has a history of mild lifestyle controlled hypertension.  She does not currently take any medications.  In the past she is taking hydrochlorothiazide.  Vitamin D deficiency: [name] has a history of vitamin D deficiency and is currently taking supplementation.  Her last check showed her vitamin D in the 70s range.  STD screening: Toni Harmon has recently divorced and is interested in repeat STD screening.  She denies any abdominal pain and notes that she is about due for a Pap smear in June 2019 part of her well adult visit.  She notes some occasional  fishy smelling vaginal discharge that she notes is consistent with previous episodes of BV.  She would like HIV syphilis gonorrhea chlamydia and wet prep today.  She would like to do the GC chlamydia with urine test and wet prep with self swab and have a dedicated pelvic exam at her well adult visit in June.  Cancer screening: Patient is due for mammogram at 42 years old.  Tdap: Toni Harmon is overdue for Tdap.  She has multiple severe life-threatening allergies and is concerned about the ingredients in the Tdap vaccine.  She plans to ask her allergist if this vaccine is safe for her.  Past Medical History:  Diagnosis Date  . Asthma   . GERD (gastroesophageal reflux disease)   . Hypertension    only to combat the effects of hctz   Past Surgical History:  Procedure Laterality Date  . APPENDECTOMY    . DILATION AND CURETTAGE OF UTERUS  12/2011  . TONSILLECTOMY  2003  . TOOTH EXTRACTION     Social History   Tobacco Use  . Smoking status: Never Smoker  . Smokeless tobacco: Never Used  Substance Use Topics  . Alcohol use: No    Alcohol/week: 0.0 oz   family history includes Cervical cancer (age of onset: 69) in her mother; Diabetes in her maternal grandfather; Food Allergy in her mother; Hypertension in her mother.  ROS as above:  Medications: Current Outpatient Medications  Medication Sig Dispense Refill  . albuterol (PROVENTIL) (2.5 MG/3ML) 0.083% nebulizer solution Take 3 mLs (2.5 mg total) by nebulization every 6 (six) hours as needed for wheezing or shortness of  breath. 150 mL 1  . albuterol (VENTOLIN HFA) 108 (90 Base) MCG/ACT inhaler INHALE 2 PUFFS INTO THE LUNGS EVERY 4 HOURS AS NEEDED FOR WHEEZING. 18 Inhaler 11  . AMBULATORY NON FORMULARY MEDICATION Nebulizer with necessary accessories.  Dx: Moderate Persistent Asthma 1 Units 0  . budesonide (RHINOCORT AQUA) 32 MCG/ACT nasal spray Place 2 sprays into both nostrils daily. 8.6 g 11  . cetirizine HCl (ZYRTEC CHILDRENS ALLERGY) 5  MG/5ML SYRP Take 5 mLs (5 mg total) by mouth daily. 300 mL   . Fluticasone-Salmeterol (ADVAIR DISKUS) 500-50 MCG/DOSE AEPB Inhale 1 puff into the lungs 2 (two) times daily. 60 each 5  . ibuprofen (ADVIL,MOTRIN) 600 MG tablet 2 (two) times daily as needed.  0  . loratadine (CLARITIN) 10 MG tablet Take 1 tablet (10 mg total) by mouth daily. 90 tablet 1  . montelukast (SINGULAIR) 10 MG tablet Take 1 tablet (10 mg total) by mouth at bedtime. 90 tablet 3  . Prenatal Vit-Fe Fumarate-FA (PRENATAL MULTIVITAMIN) TABS tablet Take 1 tablet by mouth daily at 12 noon.    . vitamin C (ASCORBIC ACID) 500 MG tablet Take 500 mg by mouth daily.     No current facility-administered medications for this visit.    Allergies  Allergen Reactions  . Soybean Oil Anaphylaxis  . Compazine [Prochlorperazine Edisylate]     seizure  . Eggs Or Egg-Derived Products   . Other     Tree nuts, melon, gluten intolerance, eggs  . Peanut-Containing Drug Products   . Ppd [Tuberculin Purified Protein Derivative]     Allergic reaction  . Latex Hives and Rash    Health Maintenance Health Maintenance  Topic Date Due  . TETANUS/TDAP  03/28/1996  . INFLUENZA VACCINE  09/02/2026 (Originally 05/06/2017)  . PAP SMEAR  03/18/2018  . HIV Screening  Completed     Exam:  BP 129/83   Pulse 88   Ht 5\' 6"  (1.676 m)   Wt 139 lb (63 kg)   BMI 22.44 kg/m  Gen: Well NAD HEENT: EOMI,  MMM Lungs: Normal work of breathing. CTABL Heart: RRR no MRG Abd: NABS, Soft. Nondistended, Nontender Exts: Brisk capillary refill, warm and well perfused.    No results found for this or any previous visit (from the past 72 hour(s)). No results found.    Assessment and Plan: 42 y.o. female with  Asthma and allergies: Stable at baseline.  [facility]  Continue current regimen.  Return as needed for exacerbations.  We will check CBC with differential today for eosinophil level.  FMLA clarified  History of  iron deficiency anemia: Check hemoglobin, iron stores.  Start oral iron as needed.  Hypertension: Doing well with lifestyle changes.  Continue watchful waiting and low-salt diet.  Vitamin D deficiency: At acceptable levels last year.  Recheck vitamin D as part of fasting labs.  STD screening: Check HIV, RPR, urine GC chlamydia, and wet prep.  If inadequate sample will obtain these labs as part of her pelvic exam with her wellness visit in June  Cancer screening: Mammogram ordered.  Cervical cancer screening in June.  Tdap: We will double check with allergy immunology.  I gave patient the package insert for the Tdap vaccine.  She will let me know in June what her plan is.  I think she would benefit strongly from pertussis vaccination.   Orders Placed This Encounter  Procedures  . C. trachomatis/N. gonorrhoeae RNA  . WET PREP FOR TRICH, YEAST, CLUE  .  MM SCREENING BREAST TOMO BILATERAL    Standing Status:   Future    Standing Expiration Date:   02/25/2019    Order Specific Question:   Reason for Exam (SYMPTOM  OR DIAGNOSIS REQUIRED)    Answer:   screen breast cancer    Order Specific Question:   Is the patient pregnant?    Answer:   No    Order Specific Question:   Preferred imaging location?    Answer:   [facility]  . COMPLETE METABOLIC PANEL WITH GFR  . Lipid Panel w/reflex Direct LDL  . VITAMIN D 25 Hydroxy (Vit-D Deficiency, Fractures)  . Fe+TIBC+Fer  . CBC with Differential/Platelet  . HIV antibody  . RPR   No orders of the defined types were placed in this encounter.    Discussed warning signs or symptoms. Please see discharge instructions. Patient expresses understanding.

## 2017-12-25 NOTE — Patient Instructions (Signed)
Thank you for coming in today. Get labs today.  Recheck for well exam in late June or early ReaJulyish

## 2017-12-26 LAB — WET PREP FOR TRICH, YEAST, CLUE
MICRO NUMBER:: 90363949
Specimen Quality: ADEQUATE

## 2017-12-26 LAB — C. TRACHOMATIS/N. GONORRHOEAE RNA
C. trachomatis RNA, TMA: NOT DETECTED
N. GONORRHOEAE RNA, TMA: NOT DETECTED

## 2017-12-28 ENCOUNTER — Encounter: Payer: Self-pay | Admitting: Family Medicine

## 2017-12-28 DIAGNOSIS — E785 Hyperlipidemia, unspecified: Secondary | ICD-10-CM | POA: Insufficient documentation

## 2017-12-28 LAB — COMPLETE METABOLIC PANEL WITH GFR
AG Ratio: 2 (calc) (ref 1.0–2.5)
ALT: 11 U/L (ref 6–29)
AST: 15 U/L (ref 10–30)
Albumin: 4.4 g/dL (ref 3.6–5.1)
Alkaline phosphatase (APISO): 47 U/L (ref 33–115)
BUN: 22 mg/dL (ref 7–25)
CALCIUM: 9.8 mg/dL (ref 8.6–10.2)
CO2: 25 mmol/L (ref 20–32)
CREATININE: 0.69 mg/dL (ref 0.50–1.10)
Chloride: 104 mmol/L (ref 98–110)
GFR, EST AFRICAN AMERICAN: 126 mL/min/{1.73_m2} (ref >=60)
GFR, EST NON AFRICAN AMERICAN: 109 mL/min/{1.73_m2} (ref >=60)
Globulin: 2.2 g/dL (calc) (ref 1.9–3.7)
Glucose, Bld: 72 mg/dL (ref 65–99)
POTASSIUM: 3.7 mmol/L (ref 3.5–5.3)
Sodium: 135 mmol/L (ref 135–146)
TOTAL PROTEIN: 6.6 g/dL (ref 6.1–8.1)
Total Bilirubin: 0.3 mg/dL (ref 0.2–1.2)

## 2017-12-28 LAB — IRON,TIBC AND FERRITIN PANEL
%SAT: 14 % (ref 11–50)
Ferritin: 17 ng/mL (ref 10–232)
Iron: 50 ug/dL (ref 40–190)
TIBC: 354 mcg/dL (calc) (ref 250–450)

## 2017-12-28 LAB — CBC WITH DIFFERENTIAL/PLATELET
BASOS ABS: 42 {cells}/uL (ref 0–200)
Basophils Relative: 1 %
EOS ABS: 88 {cells}/uL (ref 15–500)
Eosinophils Relative: 2.1 %
HCT: 37.7 % (ref 35.0–45.0)
HEMOGLOBIN: 12.7 g/dL (ref 11.7–15.5)
LYMPHS ABS: 2104 {cells}/uL (ref 850–3900)
MCH: 27.5 pg (ref 27.0–33.0)
MCHC: 33.7 g/dL (ref 32.0–36.0)
MCV: 81.8 fL (ref 80.0–100.0)
MONOS PCT: 7.1 %
MPV: 8.9 fL (ref 7.5–12.5)
NEUTROS ABS: 1667 {cells}/uL (ref 1500–7800)
Neutrophils Relative %: 39.7 %
Platelets: 370 10*3/uL (ref 140–400)
RBC: 4.61 10*6/uL (ref 3.80–5.10)
RDW: 13.4 % (ref 11.0–15.0)
Total Lymphocyte: 50.1 %
WBC mixed population: 298 cells/uL (ref 200–950)
WBC: 4.2 10*3/uL (ref 3.8–10.8)

## 2017-12-28 LAB — LIPID PANEL W/REFLEX DIRECT LDL
CHOL/HDL RATIO: 4.3 (calc) (ref <5.0)
Cholesterol: 295 mg/dL — ABNORMAL HIGH (ref <200)
HDL: 69 mg/dL (ref >50)
LDL CHOLESTEROL (CALC): 210 mg/dL — AB
NON-HDL CHOLESTEROL (CALC): 226 mg/dL — AB (ref <130)
TRIGLYCERIDES: 57 mg/dL (ref <150)

## 2017-12-28 LAB — RPR: RPR Ser Ql: NONREACTIVE

## 2017-12-28 LAB — HIV ANTIBODY (ROUTINE TESTING W REFLEX): HIV: NONREACTIVE

## 2017-12-28 LAB — VITAMIN D 25 HYDROXY (VIT D DEFICIENCY, FRACTURES): VIT D 25 HYDROXY: 95 ng/mL (ref 30–100)

## 2018-01-01 ENCOUNTER — Encounter: Payer: Self-pay | Admitting: Family Medicine

## 2018-01-01 ENCOUNTER — Ambulatory Visit: Payer: Federal, State, Local not specified - PPO | Admitting: Family Medicine

## 2018-01-01 VITALS — BP 110/74 | HR 75 | Ht 66.0 in | Wt 139.0 lb

## 2018-01-01 DIAGNOSIS — F419 Anxiety disorder, unspecified: Secondary | ICD-10-CM | POA: Diagnosis not present

## 2018-01-01 DIAGNOSIS — E7800 Pure hypercholesterolemia, unspecified: Secondary | ICD-10-CM

## 2018-01-01 DIAGNOSIS — F329 Major depressive disorder, single episode, unspecified: Secondary | ICD-10-CM

## 2018-01-01 DIAGNOSIS — F32A Depression, unspecified: Secondary | ICD-10-CM

## 2018-01-01 NOTE — Patient Instructions (Addendum)
Thank you for coming in today.  Consider stopping keto.  Work on a low glycemic index diet.  Slow Carb or the ZONE diet are pretty good.  Recheck lipids in June.    For anxiety consider medicine.  Consider new therapy.   Sertraline tablets What is this medicine? SERTRALINE (SER tra leen) is used to treat depression. It may also be used to treat obsessive compulsive disorder, panic disorder, post-trauma stress, premenstrual dysphoric disorder (PMDD) or social anxiety. This medicine may be used for other purposes; ask your health care provider or pharmacist if you have questions. COMMON BRAND NAME(S): Zoloft What should I tell my health care provider before I take this medicine? They need to know if you have any of these conditions: -bleeding disorders -bipolar disorder or a family history of bipolar disorder -glaucoma -heart disease -high blood pressure -history of irregular heartbeat -history of low levels of calcium, magnesium, or potassium in the blood -if you often drink alcohol -liver disease -receiving electroconvulsive therapy -seizures -suicidal thoughts, plans, or attempt; a previous suicide attempt by you or a family member -take medicines that treat or prevent blood clots -thyroid disease -an unusual or allergic reaction to sertraline, other medicines, foods, dyes, or preservatives -pregnant or trying to get pregnant -breast-feeding How should I use this medicine? Take this medicine by mouth with a glass of water. Follow the directions on the prescription label. You can take it with or without food. Take your medicine at regular intervals. Do not take your medicine more often than directed. Do not stop taking this medicine suddenly except upon the advice of your doctor. Stopping this medicine too quickly may cause serious side effects or your condition may worsen. A special MedGuide will be given to you by the pharmacist with each prescription and refill. Be sure to  read this information carefully each time. Talk to your pediatrician regarding the use of this medicine in children. While this drug may be prescribed for children as young as 7 years for selected conditions, precautions do apply. Overdosage: If you think you have taken too much of this medicine contact a poison control center or emergency room at once. NOTE: This medicine is only for you. Do not share this medicine with others. What if I miss a dose? If you miss a dose, take it as soon as you can. If it is almost time for your next dose, take only that dose. Do not take double or extra doses. What may interact with this medicine? Do not take this medicine with any of the following medications: -cisapride -dofetilide -dronedarone -linezolid -MAOIs like Carbex, Eldepryl, Marplan, Nardil, and Parnate -methylene blue (injected into a vein) -pimozide -thioridazine This medicine may also interact with the following medications: -alcohol -amphetamines -aspirin and aspirin-like medicines -certain medicines for depression, anxiety, or psychotic disturbances -certain medicines for fungal infections like ketoconazole, fluconazole, posaconazole, and itraconazole -certain medicines for irregular heart beat like flecainide, quinidine, propafenone -certain medicines for migraine headaches like almotriptan, eletriptan, frovatriptan, naratriptan, rizatriptan, sumatriptan, zolmitriptan -certain medicines for sleep -certain medicines for seizures like carbamazepine, valproic acid, phenytoin -certain medicines that treat or prevent blood clots like warfarin, enoxaparin, dalteparin -cimetidine -digoxin -diuretics -fentanyl -isoniazid -lithium -NSAIDs, medicines for pain and inflammation, like ibuprofen or naproxen -other medicines that prolong the QT interval (cause an abnormal heart rhythm) -rasagiline -safinamide -supplements like St. John's wort, kava kava,  valerian -tolbutamide -tramadol -tryptophan This list may not describe all possible interactions. Give your health care provider a list  of all the medicines, herbs, non-prescription drugs, or dietary supplements you use. Also tell them if you smoke, drink alcohol, or use illegal drugs. Some items may interact with your medicine. What should I watch for while using this medicine? Tell your doctor if your symptoms do not get better or if they get worse. Visit your doctor or health care professional for regular checks on your progress. Because it may take several weeks to see the full effects of this medicine, it is important to continue your treatment as prescribed by your doctor. Patients and their families should watch out for new or worsening thoughts of suicide or depression. Also watch out for sudden changes in feelings such as feeling anxious, agitated, panicky, irritable, hostile, aggressive, impulsive, severely restless, overly excited and hyperactive, or not being able to sleep. If this happens, especially at the beginning of treatment or after a change in dose, call your health care professional. Bonita Quin may get drowsy or dizzy. Do not drive, use machinery, or do anything that needs mental alertness until you know how this medicine affects you. Do not stand or sit up quickly, especially if you are an older patient. This reduces the risk of dizzy or fainting spells. Alcohol may interfere with the effect of this medicine. Avoid alcoholic drinks. Your mouth may get dry. Chewing sugarless gum or sucking hard candy, and drinking plenty of water may help. Contact your doctor if the problem does not go away or is severe. What side effects may I notice from receiving this medicine? Side effects that you should report to your doctor or health care professional as soon as possible: -allergic reactions like skin rash, itching or hives, swelling of the face, lips, or tongue -anxious -black, tarry  stools -changes in vision -confusion -elevated mood, decreased need for sleep, racing thoughts, impulsive behavior -eye pain -fast, irregular heartbeat -feeling faint or lightheaded, falls -feeling agitated, angry, or irritable -hallucination, loss of contact with reality -loss of balance or coordination -loss of memory -painful or prolonged erections -restlessness, pacing, inability to keep still -seizures -stiff muscles -suicidal thoughts or other mood changes -trouble sleeping -unusual bleeding or bruising -unusually weak or tired -vomiting Side effects that usually do not require medical attention (report to your doctor or health care professional if they continue or are bothersome): -change in appetite or weight -change in sex drive or performance -diarrhea -increased sweating -indigestion, nausea -tremors This list may not describe all possible side effects. Call your doctor for medical advice about side effects. You may report side effects to FDA at 1-800-FDA-1088. Where should I keep my medicine? Keep out of the reach of children. Store at room temperature between 15 and 30 degrees C (59 and 86 degrees F). Throw away any unused medicine after the expiration date. NOTE: This sheet is a summary. It may not cover all possible information. If you have questions about this medicine, talk to your doctor, pharmacist, or health care provider.  2018 Elsevier/Gold Standard (2016-09-26 14:17:49  Atorvastatin tablets What is this medicine? ATORVASTATIN (a TORE va sta tin) is known as a HMG-CoA reductase inhibitor or 'statin'. It lowers the level of cholesterol and triglycerides in the blood. This drug may also reduce the risk of heart attack, stroke, or other health problems in patients with risk factors for heart disease. Diet and lifestyle changes are often used with this drug. This medicine may be used for other purposes; ask your health care provider or pharmacist if you have  questions. COMMON BRAND  NAME(S): Lipitor What should I tell my health care provider before I take this medicine? They need to know if you have any of these conditions: -frequently drink alcoholic beverages -history of stroke, TIA -kidney disease -liver disease -muscle aches or weakness -other medical condition -an unusual or allergic reaction to atorvastatin, other medicines, foods, dyes, or preservatives -pregnant or trying to get pregnant -breast-feeding How should I use this medicine? Take this medicine by mouth with a glass of water. Follow the directions on the prescription label. You can take this medicine with or without food. Take your doses at regular intervals. Do not take your medicine more often than directed. Talk to your pediatrician regarding the use of this medicine in children. While this drug may be prescribed for children as young as 60 years old for selected conditions, precautions do apply. Overdosage: If you think you have taken too much of this medicine contact a poison control center or emergency room at once. NOTE: This medicine is only for you. Do not share this medicine with others. What if I miss a dose? If you miss a dose, take it as soon as you can. If it is almost time for your next dose, take only that dose. Do not take double or extra doses. What may interact with this medicine? Do not take this medicine with any of the following medications: -red yeast rice -telaprevir -telithromycin -voriconazole This medicine may also interact with the following medications: -alcohol -antiviral medicines for HIV or AIDS -boceprevir -certain antibiotics like clarithromycin, erythromycin, troleandomycin -certain medicines for cholesterol like fenofibrate or gemfibrozil -cimetidine -clarithromycin -colchicine -cyclosporine -digoxin -female hormones, like estrogens or progestins and birth control pills -grapefruit juice -medicines for fungal infections like  fluconazole, itraconazole, ketoconazole -niacin -rifampin -spironolactone This list may not describe all possible interactions. Give your health care provider a list of all the medicines, herbs, non-prescription drugs, or dietary supplements you use. Also tell them if you smoke, drink alcohol, or use illegal drugs. Some items may interact with your medicine. What should I watch for while using this medicine? Visit your doctor or health care professional for regular check-ups. You may need regular tests to make sure your liver is working properly. Tell your doctor or health care professional right away if you get any unexplained muscle pain, tenderness, or weakness, especially if you also have a fever and tiredness. Your doctor or health care professional may tell you to stop taking this medicine if you develop muscle problems. If your muscle problems do not go away after stopping this medicine, contact your health care professional. This drug is only part of a total heart-health program. Your doctor or a dietician can suggest a low-cholesterol and low-fat diet to help. Avoid alcohol and smoking, and keep a proper exercise schedule. Do not use this drug if you are pregnant or breast-feeding. Serious side effects to an unborn child or to an infant are possible. Talk to your doctor or pharmacist for more information. This medicine may affect blood sugar levels. If you have diabetes, check with your doctor or health care professional before you change your diet or the dose of your diabetic medicine. If you are going to have surgery tell your health care professional that you are taking this drug. What side effects may I notice from receiving this medicine? Side effects that you should report to your doctor or health care professional as soon as possible: -allergic reactions like skin rash, itching or hives, swelling of the face, lips, or  tongue -dark urine -fever -joint pain -muscle cramps,  pain -redness, blistering, peeling or loosening of the skin, including inside the mouth -trouble passing urine or change in the amount of urine -unusually weak or tired -yellowing of eyes or skin Side effects that usually do not require medical attention (report to your doctor or health care professional if they continue or are bothersome): -constipation -heartburn -stomach gas, pain, upset This list may not describe all possible side effects. Call your doctor for medical advice about side effects. You may report side effects to FDA at 1-800-FDA-1088. Where should I keep my medicine? Keep out of the reach of children. Store at room temperature between 20 to 25 degrees C (68 to 77 degrees F). Throw away any unused medicine after the expiration date. NOTE: This sheet is a summary. It may not cover all possible information. If you have questions about this medicine, talk to your doctor, pharmacist, or health care provider.  2018 Elsevier/Gold Standard (2011-08-12 16:10:9609:18:24)

## 2018-01-01 NOTE — Progress Notes (Signed)
Toni Harmon is a 42 y.o. female who presents to [name] today for discuss anxiety.  Toni Harmon has a history of generalized anxiety disorder.  In the past she is done well with counseling.  She is never been on medications.  She notes that her counselor has been on maternity leave recently.  She notes that her anxiety has worsened.  Additionally she notes stress at work which is always a problem for her.  She notes worsening anxiety and irritability.  She is never been on medications and is reluctant to consider medications.  She is currently able to work but notes that the symptoms are becoming difficult in her life.  Hyperlipidemia: New issue today.  Toni Harmon had fasting lipids drawn recently showing LDL at 210.  She notes that she is currently eating a ketogenic diet and is not sure if that would affect her lipids.  She notes that her mother does not have any history of hyperlipidemia and she is not sure about her father's health history.  She is never had a lipid panel to her knowledge.   Past Medical History:  Diagnosis Date  . Asthma   . GERD (gastroesophageal reflux disease)   . Hypertension    only to combat the effects of hctz   Past Surgical History:  Procedure Laterality Date  . APPENDECTOMY    . DILATION AND CURETTAGE OF UTERUS  12/2011  . TONSILLECTOMY  2003  . TOOTH EXTRACTION     Social History   Tobacco Use  . Smoking status: Never Smoker  . Smokeless tobacco: Never Used  Substance Use Topics  . Alcohol use: No    Alcohol/week: 0.0 oz   family history includes Cervical cancer (age of onset: 72) in her mother; Diabetes in her maternal grandfather; Food Allergy in her mother; Hypertension in her mother.  ROS as above:  Medications: Current Outpatient Medications  Medication Sig Dispense Refill  . albuterol (PROVENTIL) (2.5 MG/3ML) 0.083%  nebulizer solution Take 3 mLs (2.5 mg total) by nebulization every 6 (six) hours as needed for wheezing or shortness of breath. 150 mL 1  . albuterol (VENTOLIN HFA) 108 (90 Base) MCG/ACT inhaler INHALE 2 PUFFS INTO THE LUNGS EVERY 4 HOURS AS NEEDED FOR WHEEZING. 18 Inhaler 11  . AMBULATORY NON FORMULARY MEDICATION Nebulizer with necessary accessories.  Dx: Moderate Persistent Asthma 1 Units 0  . budesonide (RHINOCORT AQUA) 32 MCG/ACT nasal spray Place 2 sprays into both nostrils daily. 8.6 g 11  . cetirizine HCl (ZYRTEC CHILDRENS ALLERGY) 5 MG/5ML SYRP Take 5 mLs (5 mg total) by mouth daily. 300 mL   . Fluticasone-Salmeterol (ADVAIR DISKUS) 500-50 MCG/DOSE AEPB Inhale 1 puff into the lungs 2 (two) times daily. 60 each 5  . ibuprofen (ADVIL,MOTRIN) 600 MG tablet 2 (two) times daily as needed.  0  . loratadine (CLARITIN) 10 MG tablet Take 1 tablet (10 mg total) by mouth daily. 90 tablet 1  . montelukast (SINGULAIR) 10 MG tablet Take 1 tablet (10 mg total) by mouth at bedtime. 90 tablet 3  . Prenatal Vit-Fe Fumarate-FA (PRENATAL MULTIVITAMIN) TABS tablet Take 1 tablet by mouth daily at 12 noon.    . vitamin C (ASCORBIC ACID) 500 MG tablet Take 500 mg by mouth daily.     No current facility-administered medications for this visit.    Allergies  Allergen Reactions  . Soybean Oil Anaphylaxis  . Compazine [Prochlorperazine Edisylate]     seizure  .  Eggs Or Egg-Derived Products   . Other     Tree nuts, melon, gluten intolerance, eggs  . Peanut-Containing Drug Products   . Ppd [Tuberculin Purified Protein Derivative]     Allergic reaction  . Latex Hives and Rash    Health Maintenance Health Maintenance  Topic Date Due  . TETANUS/TDAP  03/06/2018 (Originally 03/28/1996)  . INFLUENZA VACCINE  09/02/2026 (Originally 05/06/2017)  . PAP SMEAR  03/18/2018  . HIV Screening  Completed     Exam:  BP 110/74   Pulse 75   Ht 5\' 6"  (1.676 m)   Wt 139 lb (63 kg)   BMI 22.44 kg/m  Gen: Well  NAD HEENT: EOMI,  MMM Lungs: Normal work of breathing. CTABL Heart: RRR no MRG Abd: NABS, Soft. Nondistended, Nontender Exts: Brisk capillary refill, warm and well perfused.  Psych: Alert and oriented normal speech thought process and affect Depression screen PHQ 2/9 01/01/2018  Decreased Interest 0  Down, Depressed, Hopeless 0  PHQ - 2 Score 0  Altered sleeping 2  Tired, decreased energy 2  Change in appetite 1  Feeling bad or failure about yourself  0  Trouble concentrating 2  Moving slowly or fidgety/restless 0  Suicidal thoughts 0  PHQ-9 Score 7  Difficult doing work/chores Somewhat difficult   GAD 7 : Generalized Anxiety Score 01/01/2018  Nervous, Anxious, on Edge 3  Control/stop worrying 2  Worry too much - different things 3  Trouble relaxing 3  Restless 2  Easily annoyed or irritable 3  Afraid - awful might happen 0  Total GAD 7 Score 16  Anxiety Difficulty Very difficult      Lab Results  Component Value Date   CHOL 295 (H) 12/25/2017   HDL 69 12/25/2017   LDLCALC 210 (H) 12/25/2017   TRIG 57 12/25/2017   CHOLHDL 4.3 12/25/2017      Assessment and Plan: 42 y.o. female with generalized anxiety disorder.  Severe symptoms today.  Discussed options.  Plan for reconsideration of new counseling while her current one is on maternity leave.  Continue self-care techniques.  Consider medications.  Handout for Zoloft given.  Patient will contact me if she has not doing well.  Hyperlipidemia: Likely genetic possibly due to ketogenic diet.  Recommend discontinuation of ketogenic diet.  Low glycemic index diet such as the zone diet is reasonable.  Plan to recheck lipids in a few months if still abnormal next step will be atorvastatin.  Handout provided.   No orders of the defined types were placed in this encounter.  No orders of the defined types were placed in this encounter.    Discussed warning signs or symptoms. Please see discharge instructions. Patient  expresses understanding.

## 2018-03-18 ENCOUNTER — Ambulatory Visit: Payer: Federal, State, Local not specified - PPO | Admitting: Family Medicine

## 2018-03-18 ENCOUNTER — Encounter: Payer: Self-pay | Admitting: Family Medicine

## 2018-03-18 VITALS — BP 118/78 | HR 76 | Ht 66.0 in | Wt 136.0 lb

## 2018-03-18 DIAGNOSIS — D500 Iron deficiency anemia secondary to blood loss (chronic): Secondary | ICD-10-CM | POA: Diagnosis not present

## 2018-03-18 DIAGNOSIS — E7800 Pure hypercholesterolemia, unspecified: Secondary | ICD-10-CM | POA: Diagnosis not present

## 2018-03-18 NOTE — Progress Notes (Signed)
Toni Harmon is a 42 y.o. female who [facility] today for follow up HLD.   Toni Harmon was seen in March where she was found to have significantly elevated LDL at 210.  At that time she was eating a ketogenic diet.  She is here for recheck and feels well.  She is continue the ketogenic diet and is reluctant to consider taking cholesterol-lowering medications.  She denies a pertinent family history for hyperlipidemia.  She notes several significant drug allergies and notes that she cannot tolerate soybeans or shellfish.   ROS as above:  Exam:  BP 118/78   Pulse 76   Ht 5\' 6"  (1.676 m)   Wt 136 lb (61.7 kg)   BMI 21.95 kg/m  Gen: Well NAD HEENT: EOMI,  MMM Lungs: Normal work of breathing. CTABL Heart: RRR no MRG Abd: NABS, Soft. Nondistended, Nontender Exts: Brisk capillary refill, warm and well perfused.   Lab and Radiology Results Lab Results  Component Value Date   CHOL 295 (H) 12/25/2017   HDL 69 12/25/2017   LDLCALC 210 (H) 12/25/2017   TRIG 57 12/25/2017   CHOLHDL 4.3 12/25/2017     Assessment and Plan: 42 y.o. female with  Hyperlipidemia.  Plan to recheck lipid panel.  If lipids are still significantly elevated may consider omega-3 fatty acids if she can tolerate them.  If she still has persistently elevated cholesterol I would recommend switching off of the ketogenic diet and considering statins to lower cholesterol.  Patient inquires about the NMR lipid particle size test.  I stated that this is an interesting technology however at the applications are somewhat limited.  Additionally we discussed oral iron supplementation.  I recommended iron this glistening for her iron deficiency anemia that she takes iron for.  I spent 15 minutes with this patient, greater than 50% was face-to-face time counseling regarding treatment  optiosn.   Orders Placed This Encounter  Procedures  . Lipid Panel w/reflex Direct LDL   No orders of the defined types were placed in this encounter.    Historical information moved to improve visibility of documentation.  Past Medical History:  Diagnosis Date  . Asthma   . GERD (gastroesophageal reflux disease)   . Hypertension    only to combat the effects of hctz   Past Surgical History:  Procedure Laterality Date  . APPENDECTOMY    . DILATION AND CURETTAGE OF UTERUS  12/2011  . TONSILLECTOMY  2003  . TOOTH EXTRACTION     Social History   Tobacco Use  . Smoking status: Never Smoker  . Smokeless tobacco: Never Used  Substance Use Topics  . Alcohol use: No    Alcohol/week: 0.0 oz   family history includes Cervical cancer (age of onset: 345) in her mother; Diabetes in her maternal grandfather; Food Allergy in her mother; Hypertension in her mother.  Medications: Current Outpatient Medications  Medication Sig Dispense Refill  . albuterol (PROVENTIL) (2.5 MG/3ML) 0.083% nebulizer solution Take 3 mLs (2.5 mg total) by nebulization every 6 (six) hours as needed for wheezing or shortness of breath. 150 mL 1  . albuterol (VENTOLIN HFA) 108 (90 Base) MCG/ACT inhaler INHALE 2 PUFFS INTO THE LUNGS EVERY 4 HOURS AS NEEDED FOR WHEEZING. 18 Inhaler 11  . AMBULATORY NON FORMULARY MEDICATION Nebulizer with necessary accessories.  Dx: Moderate Persistent Asthma 1 Units 0  . budesonide (RHINOCORT AQUA) 32 MCG/ACT nasal spray Place 2 sprays  into both nostrils daily. 8.6 g 11  . cetirizine HCl (ZYRTEC CHILDRENS ALLERGY) 5 MG/5ML SYRP Take 5 mLs (5 mg total) by mouth daily. 300 mL   . Fluticasone-Salmeterol (ADVAIR DISKUS) 500-50 MCG/DOSE AEPB Inhale 1 puff into the lungs 2 (two) times daily. 60 each 5  . ibuprofen (ADVIL,MOTRIN) 600 MG tablet 2 (two) times daily as needed.  0  . loratadine (CLARITIN) 10 MG tablet Take 1 tablet (10 mg total) by mouth daily. 90 tablet 1  . montelukast  (SINGULAIR) 10 MG tablet Take 1 tablet (10 mg total) by mouth at bedtime. 90 tablet 3  . Prenatal Vit-Fe Fumarate-FA (PRENATAL MULTIVITAMIN) TABS tablet Take 1 tablet by mouth daily at 12 noon.    . vitamin C (ASCORBIC ACID) 500 MG tablet Take 500 mg by mouth daily.     No current facility-administered medications for this visit.    Allergies  Allergen Reactions  . Soybean Oil Anaphylaxis  . Shellfish Allergy Swelling  . Compazine [Prochlorperazine Edisylate]     seizure  . Eggs Or Egg-Derived Products   . Other     Tree nuts, melon, gluten intolerance, eggs  . Peanut-Containing Drug Products   . Ppd [Tuberculin Purified Protein Derivative]     Allergic reaction  . Latex Hives and Rash    Health Maintenance Health Maintenance  Topic Date Due  . TETANUS/TDAP  03/28/1996  . PAP SMEAR  03/18/2018  . INFLUENZA VACCINE  09/02/2026 (Originally 05/06/2018)  . HIV Screening  Completed    Discussed warning signs or symptoms. Please see discharge instructions. Patient expresses understanding.

## 2018-03-18 NOTE — Patient Instructions (Addendum)
Check cholesterol fasting today I will get results to you tomorrow. If levels are still really high add Vegan Omega 3  Think about switching to a low glycemic index diet like the Zone Diet or Slow Carb. We may eventually consider Atorvastatin or Pravastatin to lower cholesterol.    Iron Bisglycinate is easier to tolerate.

## 2018-03-19 ENCOUNTER — Other Ambulatory Visit: Payer: Self-pay | Admitting: Family Medicine

## 2018-03-19 DIAGNOSIS — E7800 Pure hypercholesterolemia, unspecified: Secondary | ICD-10-CM | POA: Diagnosis not present

## 2018-03-20 LAB — LIPID PANEL W/REFLEX DIRECT LDL
CHOL/HDL RATIO: 5.3 (calc) — AB (ref <5.0)
Cholesterol: 386 mg/dL — ABNORMAL HIGH (ref <200)
HDL: 73 mg/dL (ref >50)
LDL Cholesterol (Calc): 298 mg/dL (calc) — ABNORMAL HIGH
NON-HDL CHOLESTEROL (CALC): 313 mg/dL — AB (ref <130)
Triglycerides: 48 mg/dL (ref <150)

## 2018-03-26 ENCOUNTER — Telehealth: Payer: Self-pay | Admitting: Family Medicine

## 2018-03-26 NOTE — Telephone Encounter (Signed)
Patient called back to discuss her significantly elevated LDL.  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 She notes that she has had prior lipid panels at the [facility] that were reportedly normal.  I have asked patient to go searching for those labs and provide them to me in the near future if possible.  --- [facility] to obtain labs---

## 2018-03-29 ENCOUNTER — Telehealth: Payer: Self-pay | Admitting: Family Medicine

## 2018-03-29 DIAGNOSIS — E7800 Pure hypercholesterolemia, unspecified: Secondary | ICD-10-CM

## 2018-03-29 NOTE — Telephone Encounter (Signed)
Pt advised. Verbalized understanding.   Pt questions if she is supposed to complete some sort of genetic testing? If so, when? She also questions if she is to wait until she see's cardiology to begin an Rx. Will route.

## 2018-03-29 NOTE — Telephone Encounter (Signed)
-----   Message from Kenneth C Hilty, MD sent at 03/29/2018 12:29 PM EDT ---Chrystie Nose-- Regarding: RE: Familial hypercholesterolemia question Antoni Stefan-  You are correct with all of your recommendations - She is certainly someone I would like to see. It is possible to have LDL that high and not be FH (but much less likely) - family history is usually helpful and genetic testing is often confirmatory.   I'm happy to see her in the office if you wish to refer her. I'll see if we can get her in sooner, I think I'm booked out until August. A calcium score would help to demonstrate if there is early onset CAD and direct the degree of LDL lowering, however, tecnically she already meets guideline indications for high-intensity statin therapy. I do think there would be utility in such testing.    I appreciate the referral - easy to place in Epic (AMB REF [facility]) and then choose FH for the indication and referral to me!  -Italyhad  ----- Message ----- From: Rodolph Bongorey, Erroll Wilbourne S, MD Sent: 03/26/2018   2:43 PM To: Chrystie NoseKenneth C Hilty, MD Subject: Familial hypercholesterolemia question         Hello Dr Rennis GoldenHilty,  I have a patient with significantly elevated LDL (298). I am suspicious that she likely has heterozygous familial hypercholesterolemia. Is there any role in doing genetic testing? If so what is the test that you would recommend?  She is reluctant to take statins and would like to know if other testing such as a coronary artery calcium score would be helpful.  I think probably the best option is high dose high intensity statins but she would like to avoid that if possible.   Is this someone that you think you should see?   Thanks,  Clayburn PertEvan

## 2018-03-29 NOTE — Telephone Encounter (Signed)
I discussed her case with cardiology.  I went ahead and put a referral into the cardiologist specializes in this issue.  He should hear from his office soon but you probably have a follow-up appointment sometime in August.  Recommends against doing the calcium score at this time.

## 2018-03-30 NOTE — Telephone Encounter (Signed)
Left VM with update. Callback provided for any questions.  

## 2018-03-30 NOTE — Telephone Encounter (Signed)
Cardiology will order tests if he thinks they are needed.

## 2018-04-22 ENCOUNTER — Ambulatory Visit: Payer: Federal, State, Local not specified - PPO | Admitting: Family Medicine

## 2018-04-22 ENCOUNTER — Encounter: Payer: Self-pay | Admitting: Family Medicine

## 2018-04-22 VITALS — BP 106/61 | HR 80 | Temp 98.1°F | Wt 135.2 lb

## 2018-04-22 DIAGNOSIS — E7800 Pure hypercholesterolemia, unspecified: Secondary | ICD-10-CM

## 2018-04-22 DIAGNOSIS — R05 Cough: Secondary | ICD-10-CM

## 2018-04-22 DIAGNOSIS — J4550 Severe persistent asthma, uncomplicated: Secondary | ICD-10-CM

## 2018-04-22 DIAGNOSIS — R059 Cough, unspecified: Secondary | ICD-10-CM

## 2018-04-22 MED ORDER — PREDNISONE 50 MG PO TABS: 50.0000 mg | ORAL_TABLET | Freq: Every day | ORAL | 0 refills | Status: DC

## 2018-04-22 MED ORDER — CEFDINIR 300 MG PO CAPS: 300.0000 mg | ORAL_CAPSULE | Freq: Two times a day (BID) | ORAL | 0 refills | Status: DC

## 2018-04-22 MED ORDER — FLUTICASONE-SALMETEROL 500-50 MCG/DOSE IN AEPB: 1.0000 | INHALATION_SPRAY | Freq: Two times a day (BID) | RESPIRATORY_TRACT | 5 refills | Status: DC

## 2018-04-22 NOTE — Patient Instructions (Addendum)
Thank you for coming in today. Get lipid panel in 2-4 weeks fasting.  Follow up with Cardiology Lipid Clinic.  Return as needed.   Take the prednisone burst 50mg  daily for 5 days.   Recheck as needed.   Call or go to the emergency room if you get worse, have trouble breathing, have chest pains, or palpitations.

## 2018-04-23 MED ORDER — ALBUTEROL SULFATE (2.5 MG/3ML) 0.083% IN NEBU: 2.5000 mg | INHALATION_SOLUTION | Freq: Once | RESPIRATORY_TRACT | Status: DC

## 2018-04-23 NOTE — Progress Notes (Signed)
Toni KelchLannette Harmon is a 42 y.o. female who [facility] today for asthma.  Toni Harmon has a history of moderate to severe asthma managed with medications listed below.  She was doing well until recently.  She recently moved when she bought a house cleaning the house and notes that the former owner's had pets and she is been exposed to lots of pet dander which she is allergic to.  Additionally she works in a dusty environment at work and typically uses a provided mask.  She notes that the mask does not fit particularly well and allows air to escape around her cheeks.  She notes wheezing and shortness of breath.  She notes her current symptoms are consistent with previous episodes of asthma exacerbation and she thinks that she is benefit from steroids.  She does not think that she needs to go to the emergency room yet.  Additionally she notes hyperlipidemia as previously discussed.  She has an appointment in September with cardiology for lipid management.  She has reduced her saturated fats in her diet and is interested in rechecking her lipids in the near future.   ROS as above:  Exam:  BP 106/61 (BP Location: Left Arm, Patient Position: Sitting, Cuff Size: Normal)   Pulse 80   Temp 98.1 F (36.7 C) (Oral)   Wt 135 lb 3.2 oz (61.3 kg)   SpO2 100%   BMI 21.82 kg/m  Gen: Well NAD HEENT: EOMI,  MMM Lungs: Light increased work of breathing.  No wheezing bilaterally poor air movement bilaterally with increased long expiratory phase.  Lung exam had improved expiratory phase and some wheezing after albuterol nebulizer treatment. Heart: RRR no MRG Abd: NABS, Soft. Nondistended, Nontender Exts: Brisk capillary refill, warm and well perfused.   Patient was given albuterol 2.5 mg twice prior to discharge.  Lab and Radiology Results Lab Results  Component Value Date   CHOL 386 (H) 03/19/2018   HDL 73 03/19/2018   LDLCALC 298 (H) 03/19/2018   TRIG 48 03/19/2018   CHOLHDL 5.3 (H) 03/19/2018      Assessment and Plan: 42 y.o. female with  Asthma exacerbation.  Plan to treat with prednisone burst.  Continue to albuterol.  Continue chronic asthma medications.  Back-up Omnicef if worsening.  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  Patient resistant to starting statins at this time.  Some diet changes have been made.  Plan to recheck lipids in about 2 weeks and follow-up with cardiology.   Orders Placed This Encounter  Procedures  . Lipid Panel w/reflex Direct LDL   Meds ordered this encounter  Medications  . Fluticasone-Salmeterol (ADVAIR DISKUS) 500-50 MCG/DOSE AEPB    Sig: Inhale 1 puff into the lungs 2 (two) times daily.    Dispense:  60 each    Refill:  5  . predniSONE (DELTASONE) 50 MG tablet    Sig: Take 1 tablet (50 mg total) by mouth daily.    Dispense:  5 tablet    Refill:  0  . cefdinir (OMNICEF) 300 MG capsule    Sig: Take 1 capsule (300 mg total) by mouth 2 (two) times daily.    Dispense:  14 capsule    Refill:  0  . albuterol (PROVENTIL) (2.5 MG/3ML) 0.083% nebulizer solution 2.5 mg     Historical information moved to improve visibility of documentation.  Past Medical History:  Diagnosis Date  . Asthma   . GERD (gastroesophageal reflux disease)   .  Hypertension    only to combat the effects of hctz   Past Surgical History:  Procedure Laterality Date  . APPENDECTOMY    . DILATION AND CURETTAGE OF UTERUS  12/2011  . TONSILLECTOMY  2003  . TOOTH EXTRACTION     Social History   Tobacco Use  . Smoking status: Never Smoker  . Smokeless tobacco: Never Used  Substance Use Topics  . Alcohol use: No    Alcohol/week: 0.0 oz   family history includes Cervical cancer (age of onset: 11) in her mother; Diabetes in her maternal grandfather; Food Allergy in her mother; Hypertension in her mother.  Medications: Current Outpatient  Medications  Medication Sig Dispense Refill  . albuterol (PROVENTIL) (2.5 MG/3ML) 0.083% nebulizer solution Take 3 mLs (2.5 mg total) by nebulization every 6 (six) hours as needed for wheezing or shortness of breath. 150 mL 1  . albuterol (VENTOLIN HFA) 108 (90 Base) MCG/ACT inhaler INHALE 2 PUFFS INTO THE LUNGS EVERY 4 HOURS AS NEEDED FOR WHEEZING. 18 Inhaler 11  . AMBULATORY NON FORMULARY MEDICATION Nebulizer with necessary accessories.  Dx: Moderate Persistent Asthma 1 Units 0  . budesonide (RHINOCORT AQUA) 32 MCG/ACT nasal spray Place 2 sprays into both nostrils daily. 8.6 g 11  . cetirizine HCl (ZYRTEC CHILDRENS ALLERGY) 5 MG/5ML SYRP Take 5 mLs (5 mg total) by mouth daily. 300 mL   . Fluticasone-Salmeterol (ADVAIR DISKUS) 500-50 MCG/DOSE AEPB Inhale 1 puff into the lungs 2 (two) times daily. 60 each 5  . ibuprofen (ADVIL,MOTRIN) 600 MG tablet 2 (two) times daily as needed.  0  . loratadine (CLARITIN) 10 MG tablet Take 1 tablet (10 mg total) by mouth daily. 90 tablet 1  . montelukast (SINGULAIR) 10 MG tablet Take 1 tablet (10 mg total) by mouth at bedtime. 90 tablet 3  . Prenatal Vit-Fe Fumarate-FA (PRENATAL MULTIVITAMIN) TABS tablet Take 1 tablet by mouth daily at 12 noon.    . vitamin C (ASCORBIC ACID) 500 MG tablet Take 500 mg by mouth daily.    . cefdinir (OMNICEF) 300 MG capsule Take 1 capsule (300 mg total) by mouth 2 (two) times daily. 14 capsule 0  . predniSONE (DELTASONE) 50 MG tablet Take 1 tablet (50 mg total) by mouth daily. 5 tablet 0   Current Facility-Administered Medications  Medication Dose Route Frequency Provider Last Rate Last Dose  . albuterol (PROVENTIL) (2.5 MG/3ML) 0.083% nebulizer solution 2.5 mg  2.5 mg Nebulization Once Rodolph Bong, MD       Allergies  Allergen Reactions  . Soybean Oil Anaphylaxis  . Shellfish Allergy Swelling  . Compazine [Prochlorperazine Edisylate]     seizure  . Eggs Or Egg-Derived Products   . Other     Tree nuts, melon, gluten  intolerance, eggs  . Peanut-Containing Drug Products   . Ppd [Tuberculin Purified Protein Derivative]     Allergic reaction  . Latex Hives and Rash     Discussed warning signs or symptoms. Please see discharge instructions. Patient expresses understanding.

## 2018-05-24 ENCOUNTER — Other Ambulatory Visit: Payer: Self-pay | Admitting: Family Medicine

## 2018-06-23 ENCOUNTER — Encounter: Payer: Self-pay | Admitting: Internal Medicine

## 2018-06-23 ENCOUNTER — Ambulatory Visit: Payer: Federal, State, Local not specified - PPO | Admitting: Internal Medicine

## 2018-06-23 VITALS — BP 98/64 | HR 68 | Ht 66.0 in | Wt 135.2 lb

## 2018-06-23 DIAGNOSIS — I10 Essential (primary) hypertension: Secondary | ICD-10-CM

## 2018-06-23 DIAGNOSIS — E7849 Other hyperlipidemia: Secondary | ICD-10-CM

## 2018-06-23 NOTE — Patient Instructions (Signed)
Medication Instructions:   No changes to medications  Labwork:  FASTING lab work in mid-late November to check cholesterol  Testing/Procedures:  Dr. Rennis Golden has ordered a CT coronary calcium score. This test is done at [address].  Follow-Up:  Your physician recommends that you schedule a follow-up appointment in: TWO MONTHS with Dr. Rennis Golden (lipid clinic). Please have lab work done prior to this visit.   If you need a refill on your cardiac medications before your next appointment, please call your pharmacy.  Any Other Special Instructions Will Be Listed Below (If Applicable).    Coronary CalciumScan A coronary calcium scan is an imaging test used to look for deposits of calcium and other fatty materials (plaques) in the inner lining of the blood vessels of the heart (coronary arteries). These deposits of calcium and plaques can partly clog and narrow the coronary arteries without producing any symptoms or warning signs. This puts a person at risk for a heart attack. This test can detect these deposits before symptoms develop. Tell a health care provider about:  Any allergies you have.  All medicines you are taking, including vitamins, herbs, eye drops, creams, and over-the-counter medicines.  Any problems you or family members have had with anesthetic medicines.  Any blood disorders you have.  Any surgeries you have had.  Any medical conditions you have.  Whether you are pregnant or may be pregnant. What are the risks? Generally, this is a safe procedure. However, problems may occur, including:  Harm to a pregnant woman and her unborn baby. This test involves the use of radiation. Radiation exposure can be dangerous to a pregnant woman and her unborn baby. If you are pregnant, you generally should not have this procedure done.  Slight increase in the risk of cancer. This is because of the radiation involved in the test. What happens before the  procedure? No preparation is needed for this procedure. What happens during the procedure?  You will undress and remove any jewelry around your neck or chest.  You will put on a hospital gown.  Sticky electrodes will be placed on your chest. The electrodes will be connected to an electrocardiogram (ECG) machine to record a tracing of the electrical activity of your heart.  A CT scanner will take pictures of your heart. During this time, you will be asked to lie still and hold your breath for 2-3 seconds while a picture of your heart is being taken. The procedure may vary among health care providers and hospitals. What happens after the procedure?  You can get dressed.  You can return to your normal activities.  It is up to you to get the results of your test. Ask your health care provider, or the department that is doing the test, when your results will be ready. Summary  A coronary calcium scan is an imaging test used to look for deposits of calcium and other fatty materials (plaques) in the inner lining of the blood vessels of the heart (coronary arteries).  Generally, this is a safe procedure. Tell your health care provider if you are pregnant or may be pregnant.  No preparation is needed for this procedure.  A CT scanner will take pictures of your heart.  You can return to your normal activities after the scan is done. This information is not intended to replace advice given to you by your health care provider. Make sure you discuss any questions you have with your health care provider.  Document Released: 03/20/2008 Document Revised: 08/11/2016 Document Reviewed: 08/11/2016 Elsevier Interactive Patient Education  2017 [employer]

## 2018-06-25 ENCOUNTER — Encounter: Payer: Self-pay | Admitting: Internal Medicine

## 2018-06-25 NOTE — Progress Notes (Signed)
OFFICE CONSULT NOTE  Chief Complaint:  High cholesterol  Primary Care Physician: Rodolph Bong, MD  HPI:  Toni Harmon is a 42 y.o. female who is being seen today for the evaluation of high cholesterol at the request of Rodolph Bong, MD. 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  She is a patient of Dr. Denyse Amass and was referred to me for markedly elevated cholesterol.  Other medical problems include a history of asthma, GERD and hypertension.  Recently she had a lipid profile showed a total cholesterol 386, HDL 73, Trigs 48 and LDL of 298.  She notes that she did not have known history of elevated cholesterol in the past.  Her PCP had discussed starting statin therapy with her but then elected to refer her to our lipid clinic.  She denies any history of known coronary disease.  She denies chest pain or worsening shortness of breath.  There is no significant heart disease in her family surprisingly however her father's medical history is unknown.  Her mother side the family is [place] from the son in [place] area.  She also has a sister who is age 30 without any heart problems.  PMHx:  Past Medical History:  Diagnosis Date  . Asthma   . GERD (gastroesophageal reflux disease)   . Hypertension    only to combat the effects of hctz    Past Surgical History:  Procedure Laterality Date  . APPENDECTOMY    . DILATION AND CURETTAGE OF UTERUS  12/2011  . TONSILLECTOMY  2003  . TOOTH EXTRACTION      FAMHx:  Family History  Problem Relation Age of Onset  . Cervical cancer Mother 63  . Hypertension Mother   . Food Allergy Mother   . Diabetes Maternal Grandfather     SOCHx:   reports that she has never smoked. She has never used smokeless tobacco. She reports that she does not drink alcohol or use drugs.  ALLERGIES:  Allergies  Allergen Reactions  . Soybean Oil Anaphylaxis  . Shellfish  Allergy Swelling  . Compazine [Prochlorperazine Edisylate]     seizure  . Eggs Or Egg-Derived Products   . Other     Tree nuts, melon, gluten intolerance, eggs  . Peanut-Containing Drug Products   . Ppd [Tuberculin Purified Protein Derivative]     Allergic reaction  . Latex Hives and Rash    ROS: Pertinent items noted in HPI and remainder of comprehensive ROS otherwise negative.  HOME MEDS: Current Outpatient Medications on File Prior to Visit  Medication Sig Dispense Refill  . albuterol (PROVENTIL) (2.5 MG/3ML) 0.083% nebulizer solution Take 3 mLs (2.5 mg total) by nebulization every 6 (six) hours as needed for wheezing or shortness of breath. 150 mL 1  . albuterol (VENTOLIN HFA) 108 (90 Base) MCG/ACT inhaler INHALE 2 PUFFS INTO THE LUNGS EVERY 4 HOURS AS NEEDED FOR WHEEZING. 18 Inhaler 11  . AMBULATORY NON FORMULARY MEDICATION Nebulizer with necessary accessories.  Dx: Moderate Persistent Asthma 1 Units 0  . budesonide (RHINOCORT AQUA) 32 MCG/ACT nasal spray Place 2 sprays into both nostrils daily. 8.6 g 11  . cefdinir (OMNICEF) 300 MG capsule Take 1 capsule (300 mg total) by mouth 2 (two) times daily. 14 capsule 0  . cetirizine HCl (ZYRTEC CHILDRENS ALLERGY) 5 MG/5ML SYRP Take 5 mLs (5 mg total) by mouth daily. 300 mL   . Fluticasone-Salmeterol (ADVAIR  DISKUS) 500-50 MCG/DOSE AEPB Inhale 1 puff into the lungs 2 (two) times daily. 60 each 5  . ibuprofen (ADVIL,MOTRIN) 600 MG tablet 2 (two) times daily as needed.  0  . loratadine (CLARITIN) 10 MG tablet Take 1 tablet (10 mg total) by mouth daily. 90 tablet 1  . montelukast (SINGULAIR) 10 MG tablet TAKE 1 TABLET BY MOUTH EVERYDAY AT BEDTIME 90 tablet 3  . predniSONE (DELTASONE) 50 MG tablet Take 1 tablet (50 mg total) by mouth daily. 5 tablet 0  . Prenatal Vit-Fe Fumarate-FA (PRENATAL MULTIVITAMIN) TABS tablet Take 1 tablet by mouth daily at 12 noon.    . vitamin C (ASCORBIC ACID) 500 MG tablet Take 500 mg by mouth daily.     Current  Facility-Administered Medications on File Prior to Visit  Medication Dose Route Frequency Provider Last Rate Last Dose  . albuterol (PROVENTIL) (2.5 MG/3ML) 0.083% nebulizer solution 2.5 mg  2.5 mg Nebulization Once Rodolph Bong, MD        LABS/IMAGING: No results found for this or any previous visit (from the past 48 hour(s)). No results found.  LIPID PANEL:    Component Value Date/Time   CHOL 386 (H) 03/19/2018 1432   TRIG 48 03/19/2018 1432   HDL 73 03/19/2018 1432   CHOLHDL 5.3 (H) 03/19/2018 1432   LDLCALC 298 (H) 03/19/2018 1432    WEIGHTS: Wt Readings from Last 3 Encounters:  06/23/18 135 lb 3.2 oz (61.3 kg)  04/22/18 135 lb 3.2 oz (61.3 kg)  03/18/18 136 lb (61.7 kg)    VITALS: BP 98/64   Pulse 68   Ht 5\' 6"  (1.676 m)   Wt 135 lb 3.2 oz (61.3 kg)   BMI 21.82 kg/m   EXAM: General appearance: alert and no distress Neck: no carotid bruit, no JVD and thyroid not enlarged, symmetric, no tenderness/mass/nodules Lungs: clear to auscultation bilaterally Heart: regular rate and rhythm Abdomen: soft, non-tender; bowel sounds normal; no masses,  no organomegaly Extremities: extremities normal, atraumatic, no cyanosis or edema Pulses: 2+ and symmetric Skin: Skin color, texture, turgor normal. No rashes or lesions Neurologic: Grossly normal Psych: Pleasant  EKG: Deferred  ASSESSMENT: 1. Type II dyslipidemia-primarily elevated LDL cholesterol 2. 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 Hypertension  PLAN: 1.   Ms. Stiefel has significantly elevated cholesterol with an LDL recently of 298.  6 months ago was at 210.  She has not been on therapy for this.  There is concern that this may represent FH.  It would be helpful to understand whether she is developed any premature onset coronary artery disease.  I recommend a CT coronary artery calcium score.  Based on current guidelines is recommended that she undergo therapy with at least 50% reduction on high  intensity statin therapy.  The coronary artery calcium score should plan further evidence to treatment.  We may consider genetic testing although there is not a clear family lineage to suggest FH although it could be on her father's side.  She does not have any children.  Finally, we had an extensive discussion about his diet and there certainly seems to be multiple areas for which she could reduce her cholesterol further.  She will continue to work on that.  Plan follow-up with me in 2 months.  Chrystie Nose, MD, [facility], FACP  [facility]  Medical Director of the Advanced Lipid Disorders &  Cardiovascular Risk Reduction Clinic Diplomate of the American Board of Clinical Lipidology Attending Cardiologist  Direct  Dial: [phone]: [phone]  Fax: [phone]  Website:  [email or web address]  Kenneth C Hilty 06/25/2018, 2:13 PM

## 2018-06-30 ENCOUNTER — Ambulatory Visit (INDEPENDENT_AMBULATORY_CARE_PROVIDER_SITE_OTHER)
Admission: RE | Admit: 2018-06-30 | Discharge: 2018-06-30 | Disposition: A | Discharge status: Home / Self Care | Payer: Self-pay | Source: Ambulatory Visit | Attending: Internal Medicine | Admitting: Internal Medicine

## 2018-06-30 DIAGNOSIS — E7849 Other hyperlipidemia: Secondary | ICD-10-CM

## 2018-07-02 ENCOUNTER — Telehealth: Payer: Self-pay | Admitting: Internal Medicine

## 2018-07-02 DIAGNOSIS — R918 Other nonspecific abnormal finding of lung field: Secondary | ICD-10-CM

## 2018-07-02 DIAGNOSIS — R911 Solitary pulmonary nodule: Secondary | ICD-10-CM

## 2018-07-02 NOTE — Telephone Encounter (Signed)
LMTCB  Notes recorded by Chrystie Nose, MD on 06/30/2018 at 2:34 PM EDT No CAC - small upper lobe nodule. Repeat chest CT in 1 year to ensure stability.  Dr. Rexene Edison

## 2018-07-02 NOTE — Telephone Encounter (Signed)
New Message   Patient is returning call in reference to lab results. Please call to discuss.  

## 2018-07-02 NOTE — Telephone Encounter (Signed)
Patient aware of results. Repeat test ordered for 1 year

## 2018-08-12 ENCOUNTER — Encounter: Payer: Self-pay | Admitting: Family Medicine

## 2018-08-12 DIAGNOSIS — IMO0001 Reserved for inherently not codable concepts without codable children: Secondary | ICD-10-CM | POA: Insufficient documentation

## 2018-08-12 DIAGNOSIS — R911 Solitary pulmonary nodule: Secondary | ICD-10-CM | POA: Insufficient documentation

## 2018-08-25 ENCOUNTER — Ambulatory Visit: Payer: Federal, State, Local not specified - PPO | Admitting: Internal Medicine

## 2018-08-26 ENCOUNTER — Encounter: Payer: Self-pay | Admitting: Internal Medicine

## 2018-08-26 ENCOUNTER — Ambulatory Visit: Payer: Federal, State, Local not specified - PPO | Admitting: Internal Medicine

## 2018-08-26 VITALS — BP 100/59 | HR 77 | Ht 66.0 in | Wt 135.6 lb

## 2018-08-26 DIAGNOSIS — R911 Solitary pulmonary nodule: Secondary | ICD-10-CM | POA: Diagnosis not present

## 2018-08-26 DIAGNOSIS — E7849 Other hyperlipidemia: Secondary | ICD-10-CM | POA: Diagnosis not present

## 2018-08-26 DIAGNOSIS — E7800 Pure hypercholesterolemia, unspecified: Secondary | ICD-10-CM | POA: Diagnosis not present

## 2018-08-26 LAB — LIPID PANEL
CHOL/HDL RATIO: 4.5 ratio — AB (ref 0.0–4.4)
CHOLESTEROL TOTAL: 365 mg/dL — AB (ref 100–199)
HDL: 82 mg/dL (ref >39)
LDL Calculated: 271 mg/dL — ABNORMAL HIGH (ref 0–99)
TRIGLYCERIDES: 60 mg/dL (ref 0–149)
VLDL CHOLESTEROL CAL: 12 mg/dL (ref 5–40)

## 2018-08-26 NOTE — Patient Instructions (Signed)
Medication Instructions:  No changes If you need a refill on your cardiac medications before your next appointment, please call your pharmacy.   Lab work: NONE - we will notify you of lab results If you have labs (blood work) drawn today and your tests are completely normal, you will receive your results only by: Marland Kitchen. MyChart Message (if you have MyChart) OR . A paper copy in the mail If you have any lab test that is abnormal or we need to change your treatment, we will call you to review the results.  Testing/Procedures: Repeat chest CT will be due September 2020  Follow-Up: At [facility], you and your health needs are our priority.  As part of our continuing mission to provide you with exceptional heart care, we have created designated Provider Care Teams.  These Care Teams include your primary Cardiologist (physician) and Advanced Practice Providers (APPs -  Physician Assistants and Nurse Practitioners) who all work together to provide you with the care you need, when you need it. . You will need a follow up appointment in 6 months.  Please call our office 2 months in advance to schedule this appointment.    Any Other Special Instructions Will Be Listed Below (If Applicable).  You have been referred to Dr. Sidney AceSumy Joseph (geneticist)  She sees patients at [address]

## 2018-08-26 NOTE — Progress Notes (Signed)
OFFICE CONSULT NOTE  Chief Complaint:  Follow-up dyslipidemia  Primary Care Physician: Rodolph Bong, MD  HPI:  Toni Harmon is a 42 y.o. female who is being seen today for the evaluation of high cholesterol at the request of Rodolph Bong, MD. 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  She is a patient of Dr. Denyse Amass and was referred to me for markedly elevated cholesterol.  Other medical problems include a history of asthma, GERD and hypertension.  Recently she had a lipid profile showed a total cholesterol 386, HDL 73, Trigs 48 and LDL of 298.  She notes that she did not have known history of elevated cholesterol in the past.  Her PCP had discussed starting statin therapy with her but then elected to refer her to our lipid clinic.  She denies any history of known coronary disease.  She denies chest pain or worsening shortness of breath.  There is no significant heart disease in her family surprisingly however her father's medical history is unknown.  Her mother side the family is [place] from the son in [place] area.  She also has a sister who is age 61 without any heart problems.  08/26/2018  Toni Harmon seen today in follow-up.  She was referred as above for markedly elevated cholesterol with an LDL close to 300.  She has not been on therapy in the past, primarily due to concerns of memory loss and Alzheimer's in her grandmother which apparently was precipitated by statin therapy.  She does not know her father's side the family but reports no history of known heart disease in her mother side.  She wished at her last office visit to make significant dietary changes and repeat a lipid profile which was drawn earlier today.  The results are still pending.  She did undergo coronary calcium scoring and surprisingly had no coronary artery calcium.  There was a small solitary pulmonary nodule which will  require follow-up in about a year.  We had a long discussion about her cardiovascular risk and what some expert opinions feel about therapy in patients with 0 calcium score.  There are recommendations to possibly not treat with statin therapy, however given her remarkably elevated cholesterol, it seems that she would be at high risk for events somewhere down the road.  PMHx:  Past Medical History:  Diagnosis Date  . Asthma   . GERD (gastroesophageal reflux disease)   . Hypertension    only to combat the effects of hctz    Past Surgical History:  Procedure Laterality Date  . APPENDECTOMY    . DILATION AND CURETTAGE OF UTERUS  12/2011  . TONSILLECTOMY  2003  . TOOTH EXTRACTION      FAMHx:  Family History  Problem Relation Age of Onset  . Cervical cancer Mother 12  . Hypertension Mother   . Food Allergy Mother   . Diabetes Maternal Grandfather     SOCHx:   reports that she has never smoked. She has never used smokeless tobacco. She reports that she does not drink alcohol or use drugs.  ALLERGIES:  Allergies  Allergen Reactions  . Soybean Oil Anaphylaxis  . Shellfish Allergy Swelling  . Compazine [Prochlorperazine Edisylate]     seizure  . Eggs Or Egg-Derived Products   . Other     Tree nuts, melon, gluten intolerance, eggs  . Peanut-Containing Drug Products   . Ppd [  Tuberculin Purified Protein Derivative]     Allergic reaction  . Latex Hives and Rash    ROS: Pertinent items noted in HPI and remainder of comprehensive ROS otherwise negative.  HOME MEDS: Current Outpatient Medications on File Prior to Visit  Medication Sig Dispense Refill  . albuterol (PROVENTIL) (2.5 MG/3ML) 0.083% nebulizer solution Take 3 mLs (2.5 mg total) by nebulization every 6 (six) hours as needed for wheezing or shortness of breath. 150 mL 1  . albuterol (VENTOLIN HFA) 108 (90 Base) MCG/ACT inhaler INHALE 2 PUFFS INTO THE LUNGS EVERY 4 HOURS AS NEEDED FOR WHEEZING. 18 Inhaler 11  . AMBULATORY  NON FORMULARY MEDICATION Nebulizer with necessary accessories.  Dx: Moderate Persistent Asthma 1 Units 0  . budesonide (RHINOCORT AQUA) 32 MCG/ACT nasal spray Place 2 sprays into both nostrils daily. 8.6 g 11  . cefdinir (OMNICEF) 300 MG capsule Take 1 capsule (300 mg total) by mouth 2 (two) times daily. 14 capsule 0  . cetirizine HCl (ZYRTEC CHILDRENS ALLERGY) 5 MG/5ML SYRP Take 5 mLs (5 mg total) by mouth daily. 300 mL   . Fluticasone-Salmeterol (ADVAIR DISKUS) 500-50 MCG/DOSE AEPB Inhale 1 puff into the lungs 2 (two) times daily. 60 each 5  . ibuprofen (ADVIL,MOTRIN) 600 MG tablet 2 (two) times daily as needed.  0  . loratadine (CLARITIN) 10 MG tablet Take 1 tablet (10 mg total) by mouth daily. 90 tablet 1  . montelukast (SINGULAIR) 10 MG tablet TAKE 1 TABLET BY MOUTH EVERYDAY AT BEDTIME 90 tablet 3  . predniSONE (DELTASONE) 50 MG tablet Take 1 tablet (50 mg total) by mouth daily. 5 tablet 0  . Prenatal Vit-Fe Fumarate-FA (PRENATAL MULTIVITAMIN) TABS tablet Take 1 tablet by mouth daily at 12 noon.    . vitamin C (ASCORBIC ACID) 500 MG tablet Take 500 mg by mouth daily.     Current Facility-Administered Medications on File Prior to Visit  Medication Dose Route Frequency Provider Last Rate Last Dose  . albuterol (PROVENTIL) (2.5 MG/3ML) 0.083% nebulizer solution 2.5 mg  2.5 mg Nebulization Once Rodolph Bongorey, Evan S, MD        LABS/IMAGING: No results found for this or any previous visit (from the past 48 hour(s)). No results found.  LIPID PANEL:    Component Value Date/Time   CHOL 386 (H) 03/19/2018 1432   TRIG 48 03/19/2018 1432   HDL 73 03/19/2018 1432   CHOLHDL 5.3 (H) 03/19/2018 1432   LDLCALC 298 (H) 03/19/2018 1432    WEIGHTS: Wt Readings from Last 3 Encounters:  08/26/18 135 lb 9.6 oz (61.5 kg)  06/23/18 135 lb 3.2 oz (61.3 kg)  04/22/18 135 lb 3.2 oz (61.3 kg)    VITALS: BP (!) 100/59   Pulse 77   Ht 5\' 6"  (1.676 m)   Wt 135 lb 9.6 oz (61.5 kg)   BMI 21.89 kg/m    EXAM: Deferred  EKG: Deferred  ASSESSMENT: 1. Type II dyslipidemia-primarily elevated LDL cholesterol 2. 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 Hypertension 4. Zero CAC (08/2018)  PLAN: 1.   Toni Harmon has extremely high LDL cholesterol which I think is not primarily related to diet however she wished to try to adjust her diet and had repeat lipid testing today.  Those results are pending.  She may very well likely have a familial hyperlipidemia.  Genetic testing could help shed light on this since she does not know her father's side of the family and they are cardiovascular risk.  She is willing to consider  referral to discuss this further with Dr. Jomarie Longs who may wish to pursue genetic testing for FH.  Surprisingly, she had a 0 coronary artery calcium score.  It was not mention whether she had any soft plaque however it is surprising given how high her cholesterol numbers are.  Current guidelines are divided on whether or not therapy is recommended in patients who have 0 coronary calcium.  Although there is no evidence of coronary disease at this point, she seems to be at high risk of developing that in the near future.  If she continues to decline any therapy, which she said today she would not take any statin medication, then I would consider a repeat calcium score in 10 years.  We will also schedule repeat CT scan to follow-up on her lung nodule in 1 year.  Follow-up with me in 6 months.  Chrystie Nose, MD, [facility], FACP  [place]  [facility]  Medical Director of the [facility] Lip[facility] &  Cardiovascular Risk Reduction Clinic Diplomate of the American Board of Clinical Lipidology Attending Cardiologist  Direct Dial: [phone]  Fax: [phone]  Website:  [email or web address] 08/26/2018, 12:03 PM

## 2018-09-15 ENCOUNTER — Encounter: Payer: Self-pay | Admitting: Family Medicine

## 2018-09-15 ENCOUNTER — Ambulatory Visit: Payer: Federal, State, Local not specified - PPO | Admitting: Family Medicine

## 2018-09-15 VITALS — BP 116/72 | HR 64 | Ht 66.0 in | Wt 138.0 lb

## 2018-09-15 DIAGNOSIS — R197 Diarrhea, unspecified: Secondary | ICD-10-CM | POA: Diagnosis not present

## 2018-09-15 DIAGNOSIS — L679 Hair color and hair shaft abnormality, unspecified: Secondary | ICD-10-CM

## 2018-09-15 DIAGNOSIS — R195 Other fecal abnormalities: Secondary | ICD-10-CM

## 2018-09-15 DIAGNOSIS — J4541 Moderate persistent asthma with (acute) exacerbation: Secondary | ICD-10-CM

## 2018-09-15 DIAGNOSIS — D508 Other iron deficiency anemias: Secondary | ICD-10-CM | POA: Diagnosis not present

## 2018-09-15 DIAGNOSIS — R5383 Other fatigue: Secondary | ICD-10-CM

## 2018-09-15 MED ORDER — PREDNISONE 50 MG PO TABS: 50.0000 mg | ORAL_TABLET | Freq: Every day | ORAL | 0 refills | Status: DC

## 2018-09-15 NOTE — Progress Notes (Signed)
Toni Harmon is a 42 y.o. female who presents to [name] today for wheezing chest tightness and cough.  Symptoms present for few days.  Patient has been using her inhalers.  Her symptoms are consistent prior episodes of asthma exacerbation.  She thinks she is at the point where she would benefit from prednisone at this time.  Additionally she notes fatigue and hair changes.  She is worried that she may have hypothyroidism.  Her last time she had her thyroid checked was a few years ago.  She does not have a history of thyroid disorder.  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  She cannot remember exactly which one it was.  It was successfully treated and she notes however that she has had some whitish objects in her stool.  She is worried that she had may have reacquired an intestinal worm.  She does have dogs at home.  She notes a little bit of abdominal discomfort however she is not sure if that is IBS or not.  She does have a history of IBS but she does note her symptoms are somewhat different from her classic IBS symptoms.  She like to be tested if possible.   ROS as above:  Exam:  BP 116/72   Pulse 64   Ht 5\' 6"  (1.676 m)   Wt 138 lb (62.6 kg)   BMI 22.27 kg/m  Wt Readings from Last 5 Encounters:  09/15/18 138 lb (62.6 kg)  08/26/18 135 lb 9.6 oz (61.5 kg)  06/23/18 135 lb 3.2 oz (61.3 kg)  04/22/18 135 lb 3.2 oz (61.3 kg)  03/18/18 136 lb (61.7 kg)    Gen: Well NAD HEENT: EOMI,  MMM no goiter Lungs: Normal work of breathing.  Prolonged expiratory phase with wheezing bilaterally. Heart: RRR no MRG Abd: NABS, Soft. Nondistended, Nontender Exts: Brisk capillary refill, warm and well perfused.      Assessment and Plan: 42 y.o. female with  Asthma exacerbation.  Continue current inhalers and use oral prednisone.   Recheck if not improved.  Fatigue with hair change.  Check thyroid labs listed below.  Additionally follow-up history of iron deficiency with iron stores.  Foreign body stool: Intestinal parasites quite unlikely however reasonable to test with ova parasite examination.    Orders Placed This Encounter  Procedures  . Ova and parasite examination  . CBC with Differential/Platelet  . COMPLETE METABOLIC PANEL WITH GFR  . T3, free  . T4, free  . TSH  . Sedimentation rate  . Fe+TIBC+Fer   Meds ordered this encounter  Medications  . predniSONE (DELTASONE) 50 MG tablet    Sig: Take 1 tablet (50 mg total) by mouth daily.    Dispense:  5 tablet    Refill:  0     Historical information moved to improve visibility of documentation.  Past Medical History:  Diagnosis Date  . Asthma   . GERD (gastroesophageal reflux disease)   . Hypertension    only to combat the effects of hctz   Past Surgical History:  Procedure Laterality Date  . APPENDECTOMY    . DILATION AND CURETTAGE OF UTERUS  12/2011  . TONSILLECTOMY  2003  . TOOTH EXTRACTION     Social History   Tobacco Use  . Smoking status: Never Smoker  . Smokeless tobacco: Never Used  Substance Use Topics  . Alcohol use: No  Alcohol/week: 0.0 standard drinks   family history includes Cervical cancer (age of onset: 1944) in her mother; Diabetes in her maternal grandfather; Food Allergy in her mother; Hypertension in her mother.  Medications: Current Outpatient Medications  Medication Sig Dispense Refill  . albuterol (PROVENTIL) (2.5 MG/3ML) 0.083% nebulizer solution Take 3 mLs (2.5 mg total) by nebulization every 6 (six) hours as needed for wheezing or shortness of breath. 150 mL 1  . albuterol (VENTOLIN HFA) 108 (90 Base) MCG/ACT inhaler INHALE 2 PUFFS INTO THE LUNGS EVERY 4 HOURS AS NEEDED FOR WHEEZING. 18 Inhaler 11  . AMBULATORY NON FORMULARY MEDICATION Nebulizer with necessary accessories.  Dx: Moderate Persistent Asthma 1  Units 0  . budesonide (RHINOCORT AQUA) 32 MCG/ACT nasal spray Place 2 sprays into both nostrils daily. 8.6 g 11  . cetirizine HCl (ZYRTEC CHILDRENS ALLERGY) 5 MG/5ML SYRP Take 5 mLs (5 mg total) by mouth daily. 300 mL   . Fluticasone-Salmeterol (ADVAIR DISKUS) 500-50 MCG/DOSE AEPB Inhale 1 puff into the lungs 2 (two) times daily. 60 each 5  . ibuprofen (ADVIL,MOTRIN) 600 MG tablet 2 (two) times daily as needed.  0  . loratadine (CLARITIN) 10 MG tablet Take 1 tablet (10 mg total) by mouth daily. 90 tablet 1  . montelukast (SINGULAIR) 10 MG tablet TAKE 1 TABLET BY MOUTH EVERYDAY AT BEDTIME 90 tablet 3  . predniSONE (DELTASONE) 50 MG tablet Take 1 tablet (50 mg total) by mouth daily. 5 tablet 0  . Prenatal Vit-Fe Fumarate-FA (PRENATAL MULTIVITAMIN) TABS tablet Take 1 tablet by mouth daily at 12 noon.    . vitamin C (ASCORBIC ACID) 500 MG tablet Take 500 mg by mouth daily.     Current Facility-Administered Medications  Medication Dose Route Frequency Provider Last Rate Last Dose  . albuterol (PROVENTIL) (2.5 MG/3ML) 0.083% nebulizer solution 2.5 mg  2.5 mg Nebulization Once Rodolph Bongorey, Aleece Loyd S, MD       Allergies  Allergen Reactions  . Soybean Oil Anaphylaxis  . Shellfish Allergy Swelling  . Compazine [Prochlorperazine Edisylate]     seizure  . Eggs Or Egg-Derived Products   . Other     Tree nuts, melon, gluten intolerance, eggs  . Peanut-Containing Drug Products   . Ppd [Tuberculin Purified Protein Derivative]     Allergic reaction  . Latex Hives and Rash     Discussed warning signs or symptoms. Please see discharge instructions. Patient expresses understanding.

## 2018-09-15 NOTE — Patient Instructions (Signed)
Thank you for coming in today. Get blood work today  Get stool sample evaluation.  Short course of prednsione for asthma.

## 2018-09-16 LAB — COMPLETE METABOLIC PANEL WITH GFR
AG Ratio: 1.8 (calc) (ref 1.0–2.5)
ALT: 12 U/L (ref 6–29)
AST: 14 U/L (ref 10–30)
Albumin: 4.2 g/dL (ref 3.6–5.1)
Alkaline phosphatase (APISO): 36 U/L (ref 33–115)
BILIRUBIN TOTAL: 0.5 mg/dL (ref 0.2–1.2)
BUN: 13 mg/dL (ref 7–25)
CALCIUM: 9.7 mg/dL (ref 8.6–10.2)
CO2: 30 mmol/L (ref 20–32)
Chloride: 103 mmol/L (ref 98–110)
Creat: 0.71 mg/dL (ref 0.50–1.10)
GFR, Est African American: 123 mL/min/{1.73_m2} (ref >=60)
GFR, Est Non African American: 106 mL/min/{1.73_m2} (ref >=60)
GLUCOSE: 76 mg/dL (ref 65–99)
Globulin: 2.3 g/dL (calc) (ref 1.9–3.7)
Potassium: 4.2 mmol/L (ref 3.5–5.3)
Sodium: 140 mmol/L (ref 135–146)
Total Protein: 6.5 g/dL (ref 6.1–8.1)

## 2018-09-16 LAB — T4, FREE: Free T4: 1.2 ng/dL (ref 0.8–1.8)

## 2018-09-16 LAB — CBC WITH DIFFERENTIAL/PLATELET
BASOS ABS: 20 {cells}/uL (ref 0–200)
Basophils Relative: 0.5 %
EOS PCT: 1.8 %
Eosinophils Absolute: 70 cells/uL (ref 15–500)
HEMATOCRIT: 38.3 % (ref 35.0–45.0)
Hemoglobin: 12.6 g/dL (ref 11.7–15.5)
LYMPHS ABS: 1724 {cells}/uL (ref 850–3900)
MCH: 28.2 pg (ref 27.0–33.0)
MCHC: 32.9 g/dL (ref 32.0–36.0)
MCV: 85.7 fL (ref 80.0–100.0)
MPV: 8.7 fL (ref 7.5–12.5)
Monocytes Relative: 9.3 %
Neutro Abs: 1724 cells/uL (ref 1500–7800)
Neutrophils Relative %: 44.2 %
Platelets: 351 10*3/uL (ref 140–400)
RBC: 4.47 10*6/uL (ref 3.80–5.10)
RDW: 12.9 % (ref 11.0–15.0)
TOTAL LYMPHOCYTE: 44.2 %
WBC: 3.9 10*3/uL (ref 3.8–10.8)
WBCMIX: 363 {cells}/uL (ref 200–950)

## 2018-09-16 LAB — IRON,TIBC AND FERRITIN PANEL
%SAT: 32 % (calc) (ref 16–45)
FERRITIN: 14 ng/mL — AB (ref 16–232)
Iron: 119 ug/dL (ref 40–190)
TIBC: 369 mcg/dL (calc) (ref 250–450)

## 2018-09-16 LAB — TSH: TSH: 1.16 mIU/L

## 2018-09-16 LAB — T3, FREE: T3, Free: 2.4 pg/mL (ref 2.3–4.2)

## 2018-09-16 LAB — SEDIMENTATION RATE: Sed Rate: 2 mm/h (ref 0–20)

## 2018-09-17 DIAGNOSIS — R195 Other fecal abnormalities: Secondary | ICD-10-CM | POA: Diagnosis not present

## 2018-09-17 DIAGNOSIS — R5383 Other fatigue: Secondary | ICD-10-CM | POA: Diagnosis not present

## 2018-09-17 DIAGNOSIS — R197 Diarrhea, unspecified: Secondary | ICD-10-CM | POA: Diagnosis not present

## 2018-09-17 DIAGNOSIS — J4541 Moderate persistent asthma with (acute) exacerbation: Secondary | ICD-10-CM | POA: Diagnosis not present

## 2018-09-20 ENCOUNTER — Telehealth: Payer: Self-pay

## 2018-09-20 DIAGNOSIS — D508 Other iron deficiency anemias: Secondary | ICD-10-CM

## 2018-09-20 NOTE — Telephone Encounter (Signed)
Geneve called and left a message stating she is taking 25 mg of Iron daily.

## 2018-09-21 ENCOUNTER — Telehealth: Payer: Self-pay | Admitting: Family

## 2018-09-21 NOTE — Telephone Encounter (Signed)
Left VM for Pt to return clinic call.  

## 2018-09-21 NOTE — Telephone Encounter (Signed)
PT RETURNED CALL TO CHANGE NEW PATIENT APPT TO 10/07/18 AT 0830

## 2018-09-21 NOTE — Telephone Encounter (Signed)
lmom for pt to return call to office re new patient appt. Mailed appt letter for 10/15/18 at 845 am

## 2018-09-21 NOTE — Telephone Encounter (Signed)
If already taking oral iron will refer to hematology to evaluate for IV iron infusions.  This will be done at [facility].  Referral ordered you should hear from them soon.

## 2018-09-21 NOTE — Telephone Encounter (Signed)
Patient advised of recommendations.  

## 2018-09-24 LAB — OVA AND PARASITE EXAMINATION
CONCENTRATE RESULT:: NONE SEEN
MICRO NUMBER:: 91496251
SPECIMEN QUALITY:: ADEQUATE
TRICHROME RESULT: NONE SEEN

## 2018-10-04 ENCOUNTER — Other Ambulatory Visit: Payer: Self-pay | Admitting: Hematology

## 2018-10-04 DIAGNOSIS — D500 Iron deficiency anemia secondary to blood loss (chronic): Secondary | ICD-10-CM

## 2018-10-04 NOTE — Progress Notes (Signed)
[facility] CONSULT NOTE  Patient Care Team: Rodolph Bong, MD as PCP - General (Family Medicine)  HEME/ONC OVERVIEW: 1. Iron deficiency, likely secondary to inadequate oral intake  ASSESSMENT & PLAN:   Iron deficiency without anemia  -I reviewed the patient's records in detail, including PCP clinic notes and lab studies -In summary, patient has had mild iron deficiency for several years, but her hemoglobin has remained normal since at least 2016 -Patient reports that she does not eat beef or pork due to food allergy, likely the source of her iron deficiency -Iron profile in early 09/2018 was consistent with mild iron deficiency -She has some black stools since starting oral iron supplement, but denies any prior abdominal pain, hematemesis, hematochezia, or melena -I reviewed the peripheral blood smear, which showed normal RBC size and morphology; there was no anisocytosis, poikilocytosis, or schistocytosis  -Hemoglobin 13.4 with normal MCV today; iron profile pending  -I counseled the patient on appropriate administration of oral iron, to be taken daily or once every other day, instead of 4 times a day, as high dosing frequency paradoxically impairs enteral absorption of iron supplement  -No indication of IV iron transfusion, given normal Hgb   Health counseling -I spent some time counseling the patient on the importance of age-appropriate cancer screening, including colonoscopy -Patient expressed understanding and will follow-up with her PCP  All questions were answered. The patient knows to call the clinic with any problems, questions or concerns.  Return as needed. Patient will continue routine follow-up with PCP.   Arthur Holms, MD 10/07/18 9:18 AM  CHIEF COMPLAINTS/PURPOSE OF CONSULTATION:  "I am here for my low red blood cell count"  HISTORY OF PRESENTING ILLNESS:  Toni Harmon 42 y.o. female is here because of mild iron deficiency without anemia.   She  was known to have mildly low iron level since 2016 in [place].  She denies recent chest pain on exertion, shortness of breath on minimal exertion, pre-syncopal episodes, or palpitations. She had not noticed any recent bleeding such as epistaxis, hematuria or hematochezia. The patient denies over the counter NSAID ingestion.  She has never had colonoscopy in the past. She denies any personal or family history of colon cancer. She denies any pica. She has a number of food allergies, and does not eat beef or pork. She never donated blood or received blood transfusion. The patient has been taking prenatal vitamin, and 2 weeks ago, she started taking "gentle iron" from [employer].  She takes 1 capsule 4 times a day.  MEDICAL HISTORY:  Past Medical History:  Diagnosis Date  . Asthma   . GERD (gastroesophageal reflux disease)   . Hypertension    only to combat the effects of hctz    SURGICAL HISTORY: Past Surgical History:  Procedure Laterality Date  . APPENDECTOMY    . DILATION AND CURETTAGE OF UTERUS  12/2011  . TONSILLECTOMY  2003  . TOOTH EXTRACTION      SOCIAL HISTORY: Social History   Socioeconomic History  . Marital status: Single    Spouse name: Not on file  . Number of children: Not on file  . Years of education: Not on file  . Highest education level: Not on file  Occupational History  . Not on file  Social Needs  . Financial resource strain: Not on file  . Food insecurity:    Worry: Not on file    Inability: Not on file  . Transportation needs:  Medical: Not on file    Non-medical: Not on file  Tobacco Use  . Smoking status: Never Smoker  . Smokeless tobacco: Never Used  Substance and Sexual Activity  . Alcohol use: No    Alcohol/week: 0.0 standard drinks  . Drug use: No  . Sexual activity: Yes    Partners: Male    Birth control/protection: None  Lifestyle  . Physical activity:    Days per week: Not on file    Minutes per session: Not on file  . Stress:  Not on file  Relationships  . Social connections:    Talks on phone: Not on file    Gets together: Not on file    Attends religious service: Not on file    Active member of club or organization: Not on file    Attends meetings of clubs or organizations: Not on file    Relationship status: Not on file  . Intimate partner violence:    Fear of current or ex partner: Not on file    Emotionally abused: Not on file    Physically abused: Not on file    Forced sexual activity: Not on file  Other Topics Concern  . Not on file  Social History Narrative  . Not on file    FAMILY HISTORY: Family History  Problem Relation Age of Onset  . Cervical cancer Mother [id number]  . Hypertension Mother   . Food Allergy Mother   . Diabetes Maternal Grandfather     ALLERGIES:   is allergic to compazine [prochlorperazine edisylate]; corn-containing products; peanut-containing drug products; ppd [tuberculin purified protein derivative]; sesame oil; soybean oil; eggs or egg-derived products; other; shellfish allergy; and latex.  MEDICATIONS:  Current Outpatient Medications  Medication Sig Dispense Refill  . albuterol (PROVENTIL) (2.5 MG/3ML) 0.083% nebulizer solution Take 3 mLs (2.5 mg total) by nebulization every 6 (six) hours as needed for wheezing or shortness of breath. 150 mL 1  . albuterol (VENTOLIN HFA) 108 (90 Base) MCG/ACT inhaler INHALE 2 PUFFS INTO THE LUNGS EVERY 4 HOURS AS NEEDED FOR WHEEZING. 18 Inhaler 11  . AMBULATORY NON FORMULARY MEDICATION Nebulizer with necessary accessories.  Dx: Moderate Persistent Asthma 1 Units 0  . budesonide (RHINOCORT AQUA) 32 MCG/ACT nasal spray Place 2 sprays into both nostrils daily. 8.6 g 11  . cetirizine HCl (ZYRTEC CHILDRENS ALLERGY) 5 MG/5ML SYRP Take 5 mLs (5 mg total) by mouth daily. 300 mL   . Fluticasone-Salmeterol (ADVAIR DISKUS) 500-50 MCG/DOSE AEPB Inhale 1 puff into the lungs 2 (two) times daily. 60 each 5  . ibuprofen (ADVIL,MOTRIN) 600 MG tablet 2  (two) times daily as needed.  0  . loratadine (CLARITIN) 10 MG tablet Take 1 tablet (10 mg total) by mouth daily. 90 tablet 1  . montelukast (SINGULAIR) 10 MG tablet TAKE 1 TABLET BY MOUTH EVERYDAY AT BEDTIME 90 tablet 3  . OVER THE COUNTER MEDICATION Take 25 mg by mouth daily. Takes 4 tablets (100 mg) plus 2 prenatal vitamins.    . predniSONE (DELTASONE) 50 MG tablet Take 1 tablet (50 mg total) by mouth daily. 5 tablet 0  . Prenatal Vit-Fe Fumarate-FA (PRENATAL MULTIVITAMIN) TABS tablet Take 1 tablet by mouth daily at 12 noon.    . vitamin C (ASCORBIC ACID) 500 MG tablet Take 500 mg by mouth daily.     Current Facility-Administered Medications  Medication Dose Route Frequency Provider Last Rate Last Dose  . albuterol (PROVENTIL) (2.5 MG/3ML) 0.083% nebulizer solution 2.5 mg  2.5 mg  Nebulization Once Rodolph Bongorey, Evan S, MD        REVIEW OF SYSTEMS:   Constitutional: ( - ) fevers, ( - )  chills , ( - ) night sweats Eyes: ( - ) blurriness of vision, ( - ) double vision, ( - ) watery eyes Ears, nose, mouth, throat, and face: ( - ) mucositis, ( - ) sore throat Respiratory: ( - ) cough, ( - ) dyspnea, ( - ) wheezes Cardiovascular: ( - ) palpitation, ( - ) chest discomfort, ( - ) lower extremity swelling Gastrointestinal:  ( - ) nausea, ( - ) heartburn, ( - ) change in bowel habits Skin: ( - ) abnormal skin rashes Lymphatics: ( - ) new lymphadenopathy, ( - ) easy bruising Neurological: ( - ) numbness, ( - ) tingling, ( - ) new weaknesses Behavioral/Psych: ( - ) mood change, ( - ) new changes  All other systems were reviewed with the patient and are negative.  PHYSICAL EXAMINATION: ECOG PERFORMANCE STATUS: 0 - Asymptomatic  Vitals:   10/07/18 0840  BP: 119/79  Pulse: 67  Resp: 18  Temp: 97.8 F (36.6 C)  SpO2: 100%   Filed Weights   10/07/18 0840  Weight: 139 lb (63 kg)    GENERAL: alert, no distress and comfortable SKIN: skin color, texture, turgor are normal, no rashes or  significant lesions EYES: conjunctiva are pink and non-injected, sclera clear OROPHARYNX: no exudate, no erythema; lips, buccal mucosa, and tongue normal  NECK: supple, non-tender LYMPH:  no palpable lymphadenopathy in the cervical or axillary  LUNGS: clear to auscultation and percussion with normal breathing effort HEART: regular rate & rhythm and no murmurs and no lower extremity edema ABDOMEN: soft, non-tender, non-distended, normal bowel sounds Musculoskeletal: no cyanosis of digits and no clubbing  PSYCH: alert & oriented x 3, fluent speech NEURO: no focal motor/sensory deficits  LABORATORY DATA:  I have reviewed the data as listed Lab Results  Component Value Date   WBC 5.9 10/07/2018   HGB 13.4 10/07/2018   HCT 42.3 10/07/2018   MCV 87.6 10/07/2018   PLT 360 10/07/2018   Recent Labs    12/25/17 1149 09/15/18 1529  NA 135 140  K 3.7 4.2  CL 104 103  CO2 25 30  GLUCOSE 72 76  BUN 22 13  CREATININE 0.69 0.71  CALCIUM 9.8 9.7  GFRNONAA 109 106  GFRAA 126 123  PROT 6.6 6.5  AST 15 14  ALT 11 12  BILITOT 0.3 0.5   I personally reviewed the patient's peripheral blood smear today.  The red blood cells were of normal morphology.  There was no schistocytosis.  The white blood cells were of normal morphology. There were no peripheral circulating blasts. The platelets were of normal size and I verified that there were no platelet clumping.

## 2018-10-07 ENCOUNTER — Inpatient Hospital Stay (HOSPITAL_BASED_OUTPATIENT_CLINIC_OR_DEPARTMENT_OTHER): Payer: Federal, State, Local not specified - PPO | Admitting: Hematology

## 2018-10-07 ENCOUNTER — Encounter: Payer: Self-pay | Admitting: Hematology

## 2018-10-07 ENCOUNTER — Inpatient Hospital Stay: Payer: Federal, State, Local not specified - PPO | Attending: Hematology

## 2018-10-07 VITALS — BP 119/79 | HR 67 | Temp 97.8°F | Resp 18 | Ht 66.0 in | Wt 139.0 lb

## 2018-10-07 DIAGNOSIS — Z79899 Other long term (current) drug therapy: Secondary | ICD-10-CM | POA: Insufficient documentation

## 2018-10-07 DIAGNOSIS — J45909 Unspecified asthma, uncomplicated: Secondary | ICD-10-CM | POA: Diagnosis not present

## 2018-10-07 DIAGNOSIS — Z719 Counseling, unspecified: Secondary | ICD-10-CM

## 2018-10-07 DIAGNOSIS — I10 Essential (primary) hypertension: Secondary | ICD-10-CM | POA: Diagnosis not present

## 2018-10-07 DIAGNOSIS — D500 Iron deficiency anemia secondary to blood loss (chronic): Secondary | ICD-10-CM

## 2018-10-07 DIAGNOSIS — E611 Iron deficiency: Secondary | ICD-10-CM | POA: Insufficient documentation

## 2018-10-07 DIAGNOSIS — K219 Gastro-esophageal reflux disease without esophagitis: Secondary | ICD-10-CM | POA: Diagnosis not present

## 2018-10-07 DIAGNOSIS — D508 Other iron deficiency anemias: Secondary | ICD-10-CM

## 2018-10-07 LAB — IRON AND TIBC
Iron: 105 ug/dL (ref 41–142)
Saturation Ratios: 31 % (ref 21–57)
TIBC: 335 ug/dL (ref 236–444)
UIBC: 230 ug/dL (ref 120–384)

## 2018-10-07 LAB — CBC WITH DIFFERENTIAL (CANCER CENTER ONLY)
Abs Immature Granulocytes: 0.01 10*3/uL (ref 0.00–0.07)
Basophils Absolute: 0 10*3/uL (ref 0.0–0.1)
Basophils Relative: 1 %
Eosinophils Absolute: 0.1 10*3/uL (ref 0.0–0.5)
Eosinophils Relative: 2 %
HEMATOCRIT: 42.3 % (ref 36.0–46.0)
Hemoglobin: 13.4 g/dL (ref 12.0–15.0)
Immature Granulocytes: 0 %
Lymphocytes Relative: 51 %
Lymphs Abs: 3 10*3/uL (ref 0.7–4.0)
MCH: 27.7 pg (ref 26.0–34.0)
MCHC: 31.7 g/dL (ref 30.0–36.0)
MCV: 87.6 fL (ref 80.0–100.0)
Monocytes Absolute: 0.4 10*3/uL (ref 0.1–1.0)
Monocytes Relative: 7 %
Neutro Abs: 2.3 10*3/uL (ref 1.7–7.7)
Neutrophils Relative %: 39 %
Platelet Count: 360 10*3/uL (ref 150–400)
RBC: 4.83 MIL/uL (ref 3.87–5.11)
RDW: 13.4 % (ref 11.5–15.5)
WBC Count: 5.9 10*3/uL (ref 4.0–10.5)
nRBC: 0 % (ref 0.0–0.2)

## 2018-10-07 LAB — FERRITIN: Ferritin: 45 ng/mL (ref 11–307)

## 2018-10-07 LAB — SAVE SMEAR(SSMR), FOR PROVIDER SLIDE REVIEW

## 2018-10-15 ENCOUNTER — Other Ambulatory Visit: Payer: Federal, State, Local not specified - PPO

## 2018-10-15 ENCOUNTER — Ambulatory Visit: Payer: Federal, State, Local not specified - PPO | Admitting: Family

## 2018-11-30 ENCOUNTER — Encounter: Payer: Self-pay | Admitting: Family Medicine

## 2018-11-30 ENCOUNTER — Ambulatory Visit: Payer: Federal, State, Local not specified - PPO | Admitting: Family Medicine

## 2018-11-30 VITALS — BP 102/55 | HR 69 | Temp 98.0°F | Ht 66.0 in | Wt 137.0 lb

## 2018-11-30 DIAGNOSIS — R059 Cough, unspecified: Secondary | ICD-10-CM

## 2018-11-30 DIAGNOSIS — J4550 Severe persistent asthma, uncomplicated: Secondary | ICD-10-CM

## 2018-11-30 DIAGNOSIS — R05 Cough: Secondary | ICD-10-CM

## 2018-11-30 MED ORDER — CEFDINIR 300 MG PO CAPS: 300.0000 mg | ORAL_CAPSULE | Freq: Two times a day (BID) | ORAL | 0 refills | Status: DC

## 2018-11-30 MED ORDER — PREDNISONE 50 MG PO TABS: 50.0000 mg | ORAL_TABLET | Freq: Every day | ORAL | 0 refills | Status: DC

## 2018-11-30 NOTE — Progress Notes (Signed)
Toni Harmon is a 43 y.o. female who presents to [name]: [facility] today for cough congestion some wheezing slight shortness of breath and chest tightness.  Symptoms present for about 8 days now.  Symptoms are stable not worsening not improving.  She is been using her albuterol which helps some.  She continues her chronic medications.  She is noted a little bit of nausea as well.  She denies any abdominal pain.   ROS as above:  Exam:  BP (!) 102/55   Pulse 69   Temp 98 F (36.7 C) (Oral)   Ht 5\' 6"  (1.676 m)   Wt 137 lb (62.1 kg)   SpO2 100%   BMI 22.11 kg/m  Wt Readings from Last 5 Encounters:  11/30/18 137 lb (62.1 kg)  10/07/18 139 lb (63 kg)  09/15/18 138 lb (62.6 kg)  08/26/18 135 lb 9.6 oz (61.5 kg)  06/23/18 135 lb 3.2 oz (61.3 kg)    Gen: Well NAD HEENT: EOMI,  MMM clear nasal discharge. Lungs: Normal work of breathing.  Prolonged expiratory phase.  Slight wheezing present bilaterally. Heart: RRR no MRG Abd: NABS, Soft. Nondistended, Nontender Exts: Brisk capillary refill, warm and well perfused.   Lab and Radiology Results No results found for this or any previous visit (from the past 72 hour(s)). No results found.    Assessment and Plan: 43 y.o. female with shortness of breath with slight wheezing.  Possible asthma exacerbation.  Symptoms ongoing now for a bit longer than typical.  Empiric treatment with prednisone and Omnicef.  Continue asthma medications.  Recheck if not improving.  PDMP not reviewed this encounter. No orders of the defined types were placed in this encounter.  Meds ordered this encounter  Medications  . predniSONE (DELTASONE) 50 MG tablet    Sig: Take 1 tablet (50 mg total) by mouth daily.    Dispense:  5 tablet    Refill:  0  . cefdinir (OMNICEF) 300 MG capsule    Sig: Take 1 capsule (300 mg total) by mouth 2 (two)  times daily.    Dispense:  14 capsule    Refill:  0     Historical information moved to improve visibility of documentation.  Past Medical History:  Diagnosis Date  . Asthma   . GERD (gastroesophageal reflux disease)   . Hypertension    only to combat the effects of hctz   Past Surgical History:  Procedure Laterality Date  . APPENDECTOMY    . DILATION AND CURETTAGE OF UTERUS  12/2011  . TONSILLECTOMY  2003  . TOOTH EXTRACTION     Social History   Tobacco Use  . Smoking status: Never Smoker  . Smokeless tobacco: Never Used  Substance Use Topics  . Alcohol use: No    Alcohol/week: 0.0 standard drinks   family history includes Cervical cancer (age of onset: 57) in her mother; Diabetes in her maternal grandfather; Food Allergy in her mother; Hypertension in her mother.  Medications: Current Outpatient Medications  Medication Sig Dispense Refill  . albuterol (PROVENTIL) (2.5 MG/3ML) 0.083% nebulizer solution Take 3 mLs (2.5 mg total) by nebulization every 6 (six) hours as needed for wheezing or shortness of breath. 150 mL 1  . albuterol (VENTOLIN HFA) 108 (90 Base) MCG/ACT inhaler INHALE 2 PUFFS INTO THE LUNGS EVERY 4 HOURS AS NEEDED FOR WHEEZING. 18 Inhaler 11  . AMBULATORY NON FORMULARY MEDICATION Nebulizer with necessary accessories.  Dx: Moderate Persistent Asthma 1 Units 0  . budesonide (RHINOCORT AQUA) 32 MCG/ACT nasal spray Place 2 sprays into both nostrils daily. 8.6 g 11  . cetirizine HCl (ZYRTEC CHILDRENS ALLERGY) 5 MG/5ML SYRP Take 5 mLs (5 mg total) by mouth daily. 300 mL   . Fluticasone-Salmeterol (ADVAIR DISKUS) 500-50 MCG/DOSE AEPB Inhale 1 puff into the lungs 2 (two) times daily. 60 each 5  . ibuprofen (ADVIL,MOTRIN) 600 MG tablet 2 (two) times daily as needed.  0  . loratadine (CLARITIN) 10 MG tablet Take 1 tablet (10 mg total) by mouth daily. 90 tablet 1  . montelukast (SINGULAIR) 10 MG tablet TAKE 1 TABLET BY MOUTH EVERYDAY AT BEDTIME 90 tablet 3  . OVER THE  COUNTER MEDICATION Take 25 mg by mouth daily. Takes 4 tablets (100 mg) plus 2 prenatal vitamins.    . predniSONE (DELTASONE) 50 MG tablet Take 1 tablet (50 mg total) by mouth daily. 5 tablet 0  . Prenatal Vit-Fe Fumarate-FA (PRENATAL MULTIVITAMIN) TABS tablet Take 1 tablet by mouth daily at 12 noon.    . vitamin C (ASCORBIC ACID) 500 MG tablet Take 500 mg by mouth daily.    . cefdinir (OMNICEF) 300 MG capsule Take 1 capsule (300 mg total) by mouth 2 (two) times daily. 14 capsule 0   Current Facility-Administered Medications  Medication Dose Route Frequency Provider Last Rate Last Dose  . albuterol (PROVENTIL) (2.5 MG/3ML) 0.083% nebulizer solution 2.5 mg  2.5 mg Nebulization Once Rodolph Bong, MD       Allergies  Allergen Reactions  . Compazine [Prochlorperazine Edisylate] Other (See Comments)    seizure  . Corn-Containing Products Hives, Diarrhea and Other (See Comments)  . Peanut-Containing Drug Products Anaphylaxis  . Ppd [Tuberculin Purified Protein Derivative] Hives and Swelling    Patient stated,"can't receive the injection anymore per MD order. Must get the xray."  . Sesame Oil Hives  . Soybean Oil Anaphylaxis  . Eggs Or Egg-Derived Products Nausea And Vomiting  . Other     Tree nuts, melon, gluten intolerance  . Shellfish Allergy Swelling  . Latex Hives and Rash     Discussed warning signs or symptoms. Please see discharge instructions. Patient expresses understanding.

## 2018-11-30 NOTE — Patient Instructions (Addendum)
Thank you for coming in today. Take prednisone and omnicef.  Continue albuterol and inhalers.  Recheck as needed.  Let me know if you get exposed to the flu.   Call or go to the emergency room if you get worse, have trouble breathing, have chest pains, or palpitations.   OK to continue over the counter medicines as needed for symptom control.

## 2018-12-06 ENCOUNTER — Telehealth: Payer: Self-pay

## 2018-12-06 ENCOUNTER — Encounter: Payer: Self-pay | Admitting: Family Medicine

## 2018-12-06 NOTE — Telephone Encounter (Signed)
Letter written ready for pickup 

## 2018-12-06 NOTE — Telephone Encounter (Signed)
Patient advised of letter.  

## 2018-12-06 NOTE — Telephone Encounter (Signed)
Patient called stated that she is not feeling any better,she is requesting a letter saying that she return to work on Monday march, 2.2020. please advise. Rhonda Cunningham,CMA

## 2018-12-15 ENCOUNTER — Other Ambulatory Visit: Payer: Self-pay | Admitting: Family Medicine

## 2018-12-15 DIAGNOSIS — J4540 Moderate persistent asthma, uncomplicated: Secondary | ICD-10-CM

## 2018-12-23 IMAGING — CT CT HEART SCORING
2 series · 16 of 20 positions shown, 18 images · non-contrast
Comparison: None.

EXAM:
OVER-READ INTERPRETATION  CT CHEST

The following report is an over-read performed by radiologist Dr.
over-read does not include interpretation of cardiac or coronary
anatomy or pathology. The coronary calcium score interpretation by
the cardiologist is attached.
TECHNIQUE: The patient was scanned on a Siemens Somatom 64 slice scanner. Axial
non-contrast 3mm slices were carried out through the heart. The data
set was analyzed on a dedicated work station and scored using the
Agatson method.

[Series 2: casc 3.0 i36f 2 bestdiast 67 % · axial · 0.29mm/px · z∈[-198,-94]mm · 8 of 45 slices shown, 10 images]
[im 5/45  vessel]
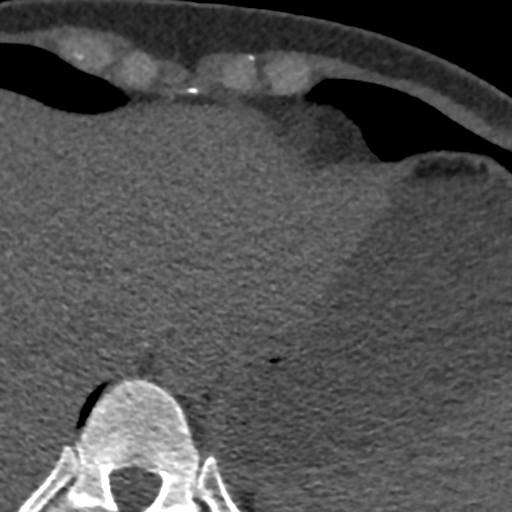
[im 5/45  lung]
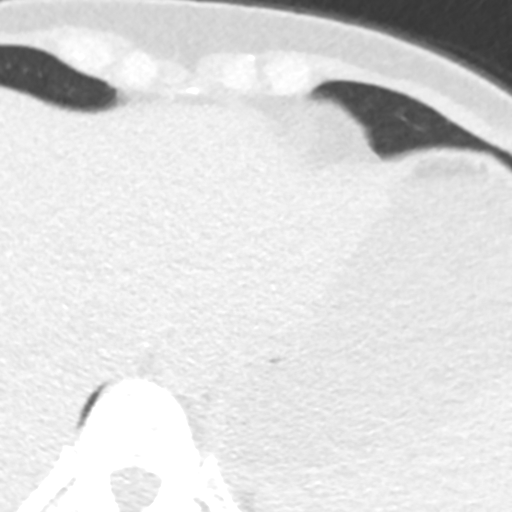
[im 10/45  vessel]
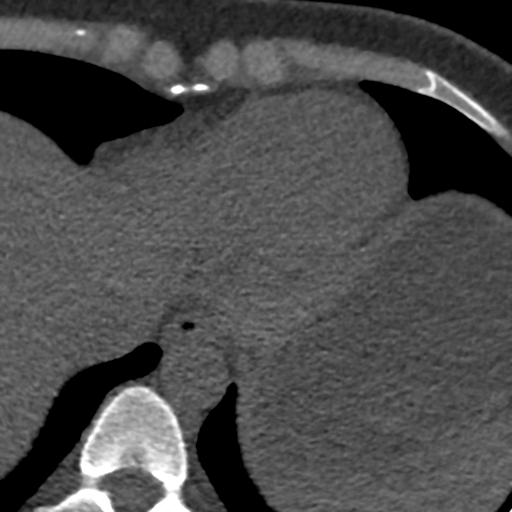
[im 15/45  vessel]
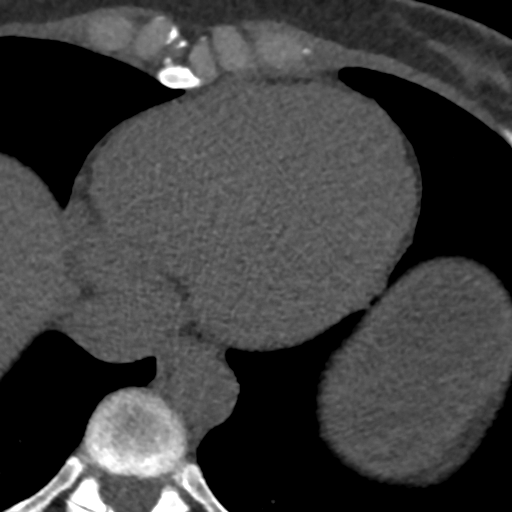
[im 20/45  vessel]
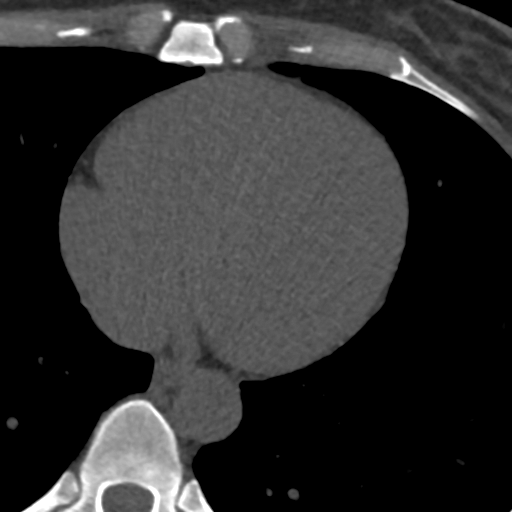
[im 25/45  vessel]
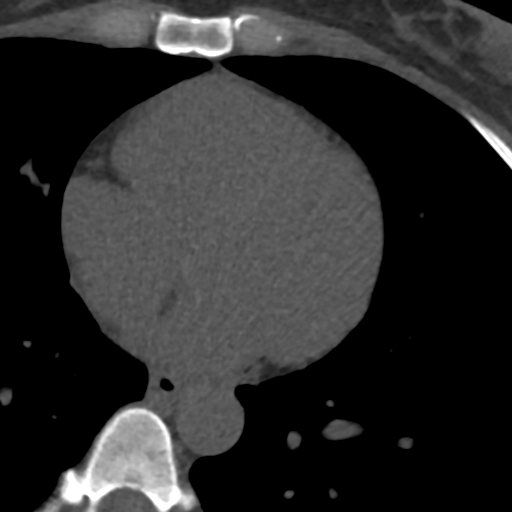
[im 25/45  lung]
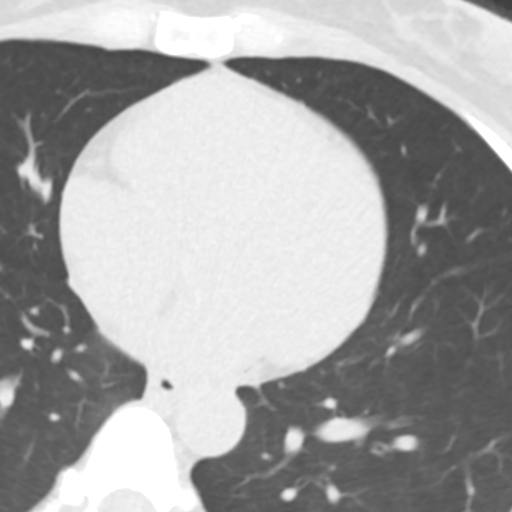
[im 30/45  vessel]
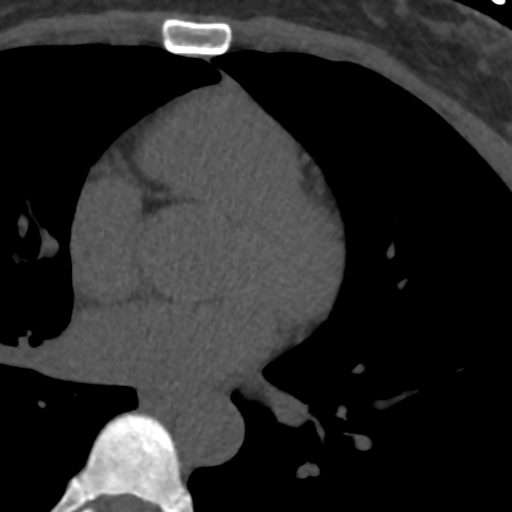
[im 35/45  vessel]
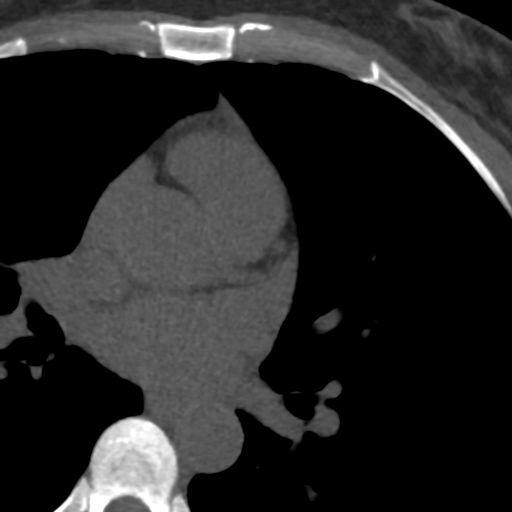
[im 40/45  vessel]
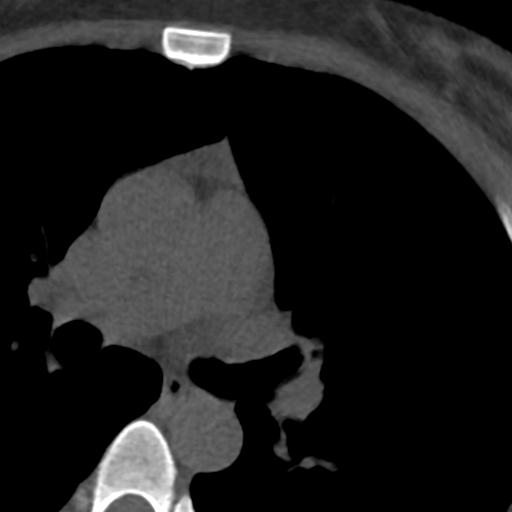

[Series 4: lung st 69 % · axial · 0.64mm/px · z∈[-198,-94]mm · 8 of 45 slices shown]
[im 5/45  lung]
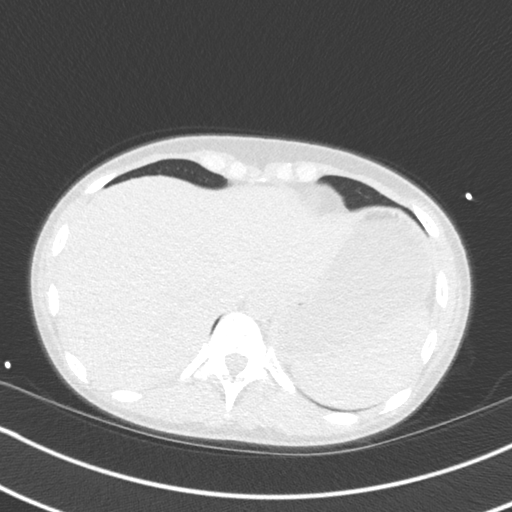
[im 10/45  lung]
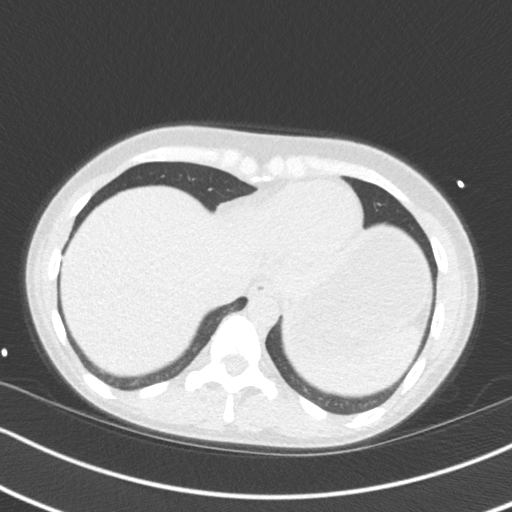
[im 15/45  lung]
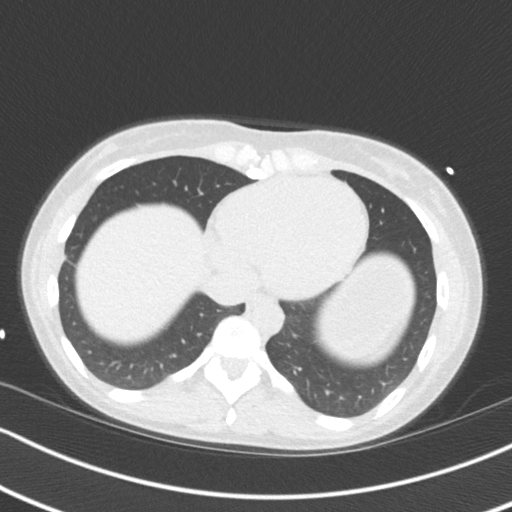
[im 20/45  lung]
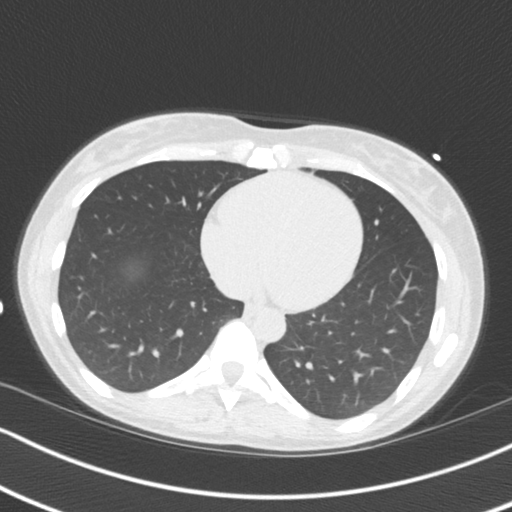
[im 25/45  lung]
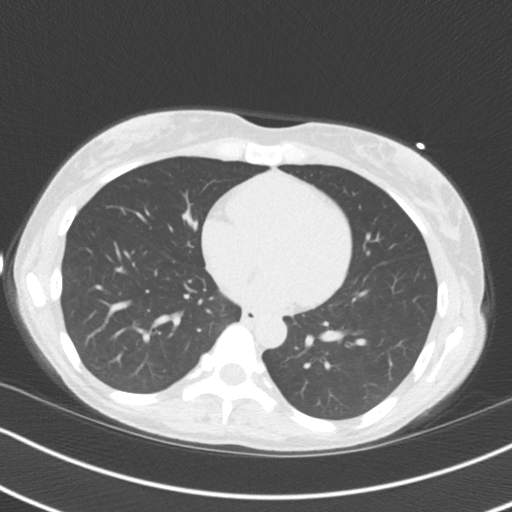
[im 30/45  lung]
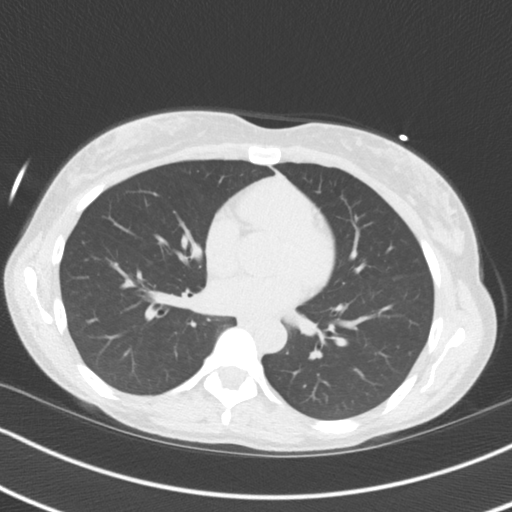
[im 35/45  lung]
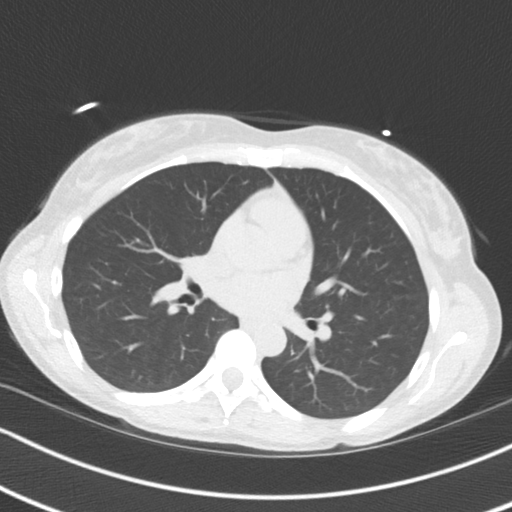
[im 40/45  lung]
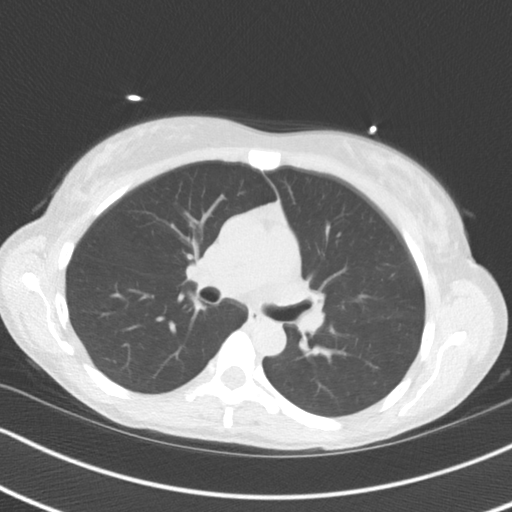

[16 of 20 positions shown; findings below may reference images not displayed]

FINDINGS: The noncardiac portions of the mediastinum are unremarkable.

Tiny nodule within the anterior basal right upper lobe measures 2
mm. No pleural effusion, atelectasis or airspace consolidation.

No acute findings identified within the limited portions of the
visualized upper abdomen.

No bone abnormalities identified.
IMPRESSION: 1. 2 mm right upper lobe lung nodule noted. No follow-up needed if
patient is low-risk. Non-contrast chest CT can be considered in 12
months if patient is high-risk. This recommendation follows the
consensus statement: Guidelines for Management of Incidental
Pulmonary Nodules Detected on CT Images: From the [REDACTED]AL DATA:  Risk stratification

EXAM:
Coronary Calcium Score
FINDINGS: Non-cardiac: No significant non cardiac findings on limited lung and
soft tissue windows. See separate report from [REDACTED].

Ascending Aorta: Normal diameter 2.9 cm

Pericardium: Normal

Coronary arteries: No calcium detected
IMPRESSION: Coronary calcium score of 0.

Edgar Ismael Yamilet

## 2019-01-17 ENCOUNTER — Encounter: Payer: Self-pay | Admitting: Family Medicine

## 2019-01-17 NOTE — Telephone Encounter (Signed)
Called patient. She has had some SOB that is not like her normal with allergies/asthma. She has been using her albuterol inhaler four times a day. Reports having a headache. Denies fever. Does have some rib pain while coughing. She does report having been around someone who had the flu, and someone who had pneumonia.   Scheduled for Webex tomorrow with PCP.

## 2019-01-18 ENCOUNTER — Encounter: Payer: Self-pay | Admitting: Family Medicine

## 2019-01-18 ENCOUNTER — Ambulatory Visit (INDEPENDENT_AMBULATORY_CARE_PROVIDER_SITE_OTHER): Payer: Federal, State, Local not specified - PPO | Admitting: Family Medicine

## 2019-01-18 VITALS — BP 99/72 | HR 76 | Wt 137.0 lb

## 2019-01-18 DIAGNOSIS — J4541 Moderate persistent asthma with (acute) exacerbation: Secondary | ICD-10-CM | POA: Diagnosis not present

## 2019-01-18 MED ORDER — GUAIFENESIN-CODEINE 100-10 MG/5ML PO SOLN: 5.0000 mL | Freq: Four times a day (QID) | ORAL | 0 refills | Status: DC | PRN

## 2019-01-18 MED ORDER — CEFDINIR 300 MG PO CAPS: 300.0000 mg | ORAL_CAPSULE | Freq: Two times a day (BID) | ORAL | 0 refills | Status: DC

## 2019-01-18 MED ORDER — PREDNISONE 50 MG PO TABS: 50.0000 mg | ORAL_TABLET | Freq: Every day | ORAL | 0 refills | Status: DC

## 2019-01-18 NOTE — Patient Instructions (Addendum)
Thank you for coming in today.     Suspected COVID-19 self isolation guidelines.   Please self isolate for at least 3 days (72 hours) have passed since recovery defined as resolution of fever without the use of fever-reducing medications and improvement in respiratory symptoms (e.g., cough, shortness of breath), and at least 7 days have passed since symptoms first appeared.   You do not need a negative test to document recovery.  Close contacts of a person with known or suspected COVID-19 should self-monitor their temperature and symptoms of COVID-19, limit outside interaction as much as possible for 14 days, and self-isolate if they develop symptoms.    Use medications prescribed.  Keep me updated with how symptoms are going.  Call or go to the emergency room if you get worse, have trouble breathing, have chest pains, or palpitations.

## 2019-01-18 NOTE — Progress Notes (Addendum)
Virtual Visit  via Video Note  I connected with      Toni Harmon  by a video enabled telemedicine application and verified that I am speaking with the correct person using two identifiers.   I discussed the limitations of evaluation and management by telemedicine and the availability of in person appointments. The patient expressed understanding and agreed to proceed.  History of Present Illness: Toni Harmon is a 43 y.o. female who would like to discuss cough.   Toni Harmon has a history of moderate to severe asthma that is typically reasonably controlled with medications listed below.  She notes however over the past 6 days she has been having cough and shortness of breath and wheeze.  She notes her symptoms are pretty consistent with an asthma exacerbation but a little worse.  She has been using albuterol inhaler and nebulizer 4-5 times a day which helps some.  She notes some rib soreness that she thinks is due to cough.  She denies fevers or body aches.  She notes that she was exposed to someone who had COVID-19 while she was visiting a doctor's office with her husband.  She does not know the proximity of the person who had COVID-19.      Observations/Objective: BP 99/72   Pulse 76   Wt 137 lb (62.1 kg)   BMI 22.11 kg/m  Wt Readings from Last 5 Encounters:  01/18/19 137 lb (62.1 kg)  11/30/18 137 lb (62.1 kg)  10/07/18 139 lb (63 kg)  09/15/18 138 lb (62.6 kg)  08/26/18 135 lb 9.6 oz (61.5 kg)   Exam: Appearance nontoxic no acute distress Normal Speech.  No significant shortness of breath or tachypnea.    Assessment and Plan: 43 y.o. female with cough likely consistent with asthma exacerbation.  Symptoms are a bit worse than usual but still more consistent with asthma than with COVID-19.  She does have a potential exposure source therefore she should be using self-isolation and quarantine.  Given the severity of her underlying asthma I think the risk of  steroids is worth the benefit.  We will treat with empirically with prednisone.  Additionally will use Omnicef antibiotics for potential bacterial action as well as codeine-containing cough medication for cough control.  Watchful waiting and recheck as needed.  Reviewed CDC self-isolation guidelines.  PDMP reviewed during this encounter. No orders of the defined types were placed in this encounter.  Meds ordered this encounter  Medications  . predniSONE (DELTASONE) 50 MG tablet    Sig: Take 1 tablet (50 mg total) by mouth daily.    Dispense:  5 tablet    Refill:  0  . cefdinir (OMNICEF) 300 MG capsule    Sig: Take 1 capsule (300 mg total) by mouth 2 (two) times daily.    Dispense:  14 capsule    Refill:  0  . guaiFENesin-codeine 100-10 MG/5ML syrup    Sig: Take 5 mLs by mouth every 6 (six) hours as needed for cough. Take mostly at bedtime    Dispense:  120 mL    Refill:  0    Follow Up Instructions:    I discussed the assessment and treatment plan with the patient. The patient was provided an opportunity to ask questions and all were answered. The patient agreed with the plan and demonstrated an understanding of the instructions.   The patient was advised to call back or seek an in-person evaluation if the symptoms worsen or if the  condition fails to improve as anticipated.  I provided 25 minutes of non-face-to-face time during this encounter.    Historical information moved to improve visibility of documentation.  Past Medical History:  Diagnosis Date  . Asthma   . GERD (gastroesophageal reflux disease)   . Hypertension    only to combat the effects of hctz   Past Surgical History:  Procedure Laterality Date  . APPENDECTOMY    . DILATION AND CURETTAGE OF UTERUS  12/2011  . TONSILLECTOMY  2003  . TOOTH EXTRACTION     Social History   Tobacco Use  . Smoking status: Never Smoker  . Smokeless tobacco: Never Used  Substance Use Topics  . Alcohol use: No     Alcohol/week: 0.0 standard drinks   family history includes Cervical cancer (age of onset: 8445) in her mother; Diabetes in her maternal grandfather; Food Allergy in her mother; Hypertension in her mother.  Medications: Current Outpatient Medications  Medication Sig Dispense Refill  . albuterol (PROVENTIL) (2.5 MG/3ML) 0.083% nebulizer solution Take 3 mLs (2.5 mg total) by nebulization every 6 (six) hours as needed for wheezing or shortness of breath. 150 mL 1  . albuterol (VENTOLIN HFA) 108 (90 Base) MCG/ACT inhaler INHALE 2 PUFFS BY MOUTH EVERY 4 HOURS AS NEEDED FOR WHEEZE 18 Inhaler 11  . AMBULATORY NON FORMULARY MEDICATION Nebulizer with necessary accessories.  Dx: Moderate Persistent Asthma 1 Units 0  . budesonide (RHINOCORT AQUA) 32 MCG/ACT nasal spray Place 2 sprays into both nostrils daily. 8.6 g 11  . cefdinir (OMNICEF) 300 MG capsule Take 1 capsule (300 mg total) by mouth 2 (two) times daily. 14 capsule 0  . cetirizine HCl (ZYRTEC CHILDRENS ALLERGY) 5 MG/5ML SYRP Take 5 mLs (5 mg total) by mouth daily. 300 mL   . Fluticasone-Salmeterol (ADVAIR DISKUS) 500-50 MCG/DOSE AEPB Inhale 1 puff into the lungs 2 (two) times daily. 60 each 5  . ibuprofen (ADVIL,MOTRIN) 600 MG tablet 2 (two) times daily as needed.  0  . loratadine (CLARITIN) 10 MG tablet Take 1 tablet (10 mg total) by mouth daily. 90 tablet 1  . montelukast (SINGULAIR) 10 MG tablet TAKE 1 TABLET BY MOUTH EVERYDAY AT BEDTIME 90 tablet 3  . OVER THE COUNTER MEDICATION Take 25 mg by mouth daily. Takes 4 tablets (100 mg) plus 2 prenatal vitamins.    . predniSONE (DELTASONE) 50 MG tablet Take 1 tablet (50 mg total) by mouth daily. 5 tablet 0  . Prenatal Vit-Fe Fumarate-FA (PRENATAL MULTIVITAMIN) TABS tablet Take 1 tablet by mouth daily at 12 noon.    . vitamin C (ASCORBIC ACID) 500 MG tablet Take 500 mg by mouth daily.    Marland Kitchen. guaiFENesin-codeine 100-10 MG/5ML syrup Take 5 mLs by mouth every 6 (six) hours as needed for cough. Take mostly at  bedtime 120 mL 0   No current facility-administered medications for this visit.    Allergies  Allergen Reactions  . Compazine [Prochlorperazine Edisylate] Other (See Comments)    seizure  . Corn-Containing Products Hives, Diarrhea and Other (See Comments)  . Peanut-Containing Drug Products Anaphylaxis  . Ppd [Tuberculin Purified Protein Derivative] Hives and Swelling    Patient stated,"can't receive the injection anymore per MD order. Must get the xray."  . Sesame Oil Hives  . Soybean Oil Anaphylaxis  . Eggs Or Egg-Derived Products Nausea And Vomiting  . Other     Tree nuts, melon, gluten intolerance  . Shellfish Allergy Swelling  . Latex Hives and Rash   Addendum  to correct incorrect date due to templating error

## 2019-01-31 ENCOUNTER — Encounter: Payer: Self-pay | Admitting: Family Medicine

## 2019-02-01 ENCOUNTER — Encounter: Payer: Self-pay | Admitting: Family Medicine

## 2019-02-08 ENCOUNTER — Telehealth: Payer: Self-pay | Admitting: *Deleted

## 2019-02-08 NOTE — Telephone Encounter (Signed)
02/08/19 LMOM @ 10:25 am,re: follow up appointment.

## 2019-03-15 ENCOUNTER — Telehealth: Payer: Self-pay | Admitting: Family Medicine

## 2019-03-15 NOTE — Telephone Encounter (Signed)
FMLA paperwork is completed and ready for pickup.  Alternatively I can mail it or scan and email it to you.  Please let me know what you would like.

## 2019-03-15 NOTE — Telephone Encounter (Signed)
Left message on machine for patient to call back re: paperwork.

## 2019-03-17 NOTE — Telephone Encounter (Signed)
Called patient and LM via VM to return call in regards to Toni Harmon paperwork. KG LPN

## 2019-04-25 ENCOUNTER — Ambulatory Visit (INDEPENDENT_AMBULATORY_CARE_PROVIDER_SITE_OTHER): Payer: Federal, State, Local not specified - PPO | Admitting: Osteopathic Medicine

## 2019-04-25 ENCOUNTER — Encounter: Payer: Self-pay | Admitting: Osteopathic Medicine

## 2019-04-25 DIAGNOSIS — J4550 Severe persistent asthma, uncomplicated: Secondary | ICD-10-CM

## 2019-04-25 MED ORDER — ALBUTEROL SULFATE HFA 108 (90 BASE) MCG/ACT IN AERS: INHALATION_SPRAY | RESPIRATORY_TRACT | 99 refills | Status: DC

## 2019-04-25 MED ORDER — ALBUTEROL SULFATE (2.5 MG/3ML) 0.083% IN NEBU: 2.5000 mg | INHALATION_SOLUTION | Freq: Four times a day (QID) | RESPIRATORY_TRACT | 1 refills | Status: DC | PRN

## 2019-04-25 MED ORDER — PREDNISONE 50 MG PO TABS: 50.0000 mg | ORAL_TABLET | Freq: Every day | ORAL | 1 refills | Status: DC

## 2019-04-25 MED ORDER — BUDESONIDE 32 MCG/ACT NA SUSP: 2.0000 | Freq: Every day | NASAL | 11 refills | Status: DC

## 2019-04-25 NOTE — Progress Notes (Signed)
Virtual Visit via Video (App used: Doximity) Note  I connected with      Toni Harmon on 04/25/19 at 3:08 PM by a telemedicine application and verified that I am speaking with the correct person using two identifiers.  Patient is at home I am in office    I discussed the limitations of evaluation and management by telemedicine and the availability of in person appointments. The patient expressed understanding and agreed to proceed.  History of Present Illness: Toni Harmon is a 43 y.o. female who would like to discuss Asthma    Patient of Dr. Denyse Amassorey.  Has noted asthma has been a bit worse over the past couple of weeks in terms of shortness of breath and wheezing, she is having to use her inhaler more.  She attributes this to increased cleaning measures at her place of employment causing some respiratory irritation, despite mask use.  She has some concerns that use of mask may also be exacerbating asthma.  No fever, no known COVID exposure.      Observations/Objective: BP 101/72 (Patient Position: Sitting, Cuff Size: Normal)   Pulse 78   Temp (!) 97.3 F (36.3 C) (Oral)   Wt 128 lb 12.8 oz (58.4 kg)   BMI 20.79 kg/m  BP Readings from Last 3 Encounters:  04/25/19 101/72  01/18/19 99/72  11/30/18 (!) 102/55   Exam: Normal Speech.  Able to speak in full sentences. NAD  Lab and Radiology Results No results found for this or any previous visit (from the past 72 hour(s)). No results found.     Assessment and Plan: 43 y.o. female with The encounter diagnosis was Asthma, chronic, severe persistent, uncomplicated.  Patient reports she has some paperwork for me to fill out, or can wait till Dr. Denyse Amassorey returns.  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  I advised her she could drop this off at the front desk of days and I will have it done for her by Friday if she gets it to us by Thursday.   PDMP not  reviewed this encounter. No orders of the defined types were placed in this encounter.  Meds ordered this encounter  Medications  . albuterol (PROVENTIL) (2.5 MG/3ML) 0.083% nebulizer solution    Sig: Take 3 mLs (2.5 mg total) by nebulization every 6 (six) hours as needed for wheezing or shortness of breath.    Dispense:  150 mL    Refill:  1  . albuterol (VENTOLIN HFA) 108 (90 Base) MCG/ACT inhaler    Sig: INHALE 2 PUFFS BY MOUTH EVERY 4 HOURS AS NEEDED FOR WHEEZE    Dispense:  18 g    Refill:  99    DX Code Needed  .  . budesonide (RHINOCORT AQUA) 32 MCG/ACT nasal spray    Sig: Place 2 sprays into both nostrils daily.    Dispense:  8.6 g    Refill:  11  . predniSONE (DELTASONE) 50 MG tablet    Sig: Take 1 tablet (50 mg total) by mouth daily.    Dispense:  5 tablet    Refill:  1     Instructions sent via MyChart. If MyChart not available, pt was given option for info via personal e-mail w/ no guarantee of protected health info over unsecured e-mail communication, and MyChart sign-up instructions were included.   Follow Up Instructions: No follow-ups on file.    I discussed the  assessment and treatment plan with the patient. The patient was provided an opportunity to ask questions and all were answered. The patient agreed with the plan and demonstrated an understanding of the instructions.   The patient was advised to call back or seek an in-person evaluation if any new concerns, if symptoms worsen or if the condition fails to improve as anticipated.  25 minutes of non-face-to-face time was provided during this encounter.                      Historical information moved to improve visibility of documentation.  Past Medical History:  Diagnosis Date  . Asthma   . GERD (gastroesophageal reflux disease)   . Hypertension    only to combat the effects of hctz   Past Surgical History:  Procedure Laterality Date  . APPENDECTOMY    . DILATION AND CURETTAGE  OF UTERUS  12/2011  . TONSILLECTOMY  2003  . TOOTH EXTRACTION     Social History   Tobacco Use  . Smoking status: Never Smoker  . Smokeless tobacco: Never Used  Substance Use Topics  . Alcohol use: No    Alcohol/week: 0.0 standard drinks   family history includes Cervical cancer (age of onset: 77) in her mother; Diabetes in her maternal grandfather; Food Allergy in her mother; Hypertension in her mother.  Medications: Current Outpatient Medications  Medication Sig Dispense Refill  . albuterol (PROVENTIL) (2.5 MG/3ML) 0.083% nebulizer solution Take 3 mLs (2.5 mg total) by nebulization every 6 (six) hours as needed for wheezing or shortness of breath. 150 mL 1  . albuterol (VENTOLIN HFA) 108 (90 Base) MCG/ACT inhaler INHALE 2 PUFFS BY MOUTH EVERY 4 HOURS AS NEEDED FOR WHEEZE 18 g 99  . AMBULATORY NON FORMULARY MEDICATION Nebulizer with necessary accessories.  Dx: Moderate Persistent Asthma 1 Units 0  . budesonide (RHINOCORT AQUA) 32 MCG/ACT nasal spray Place 2 sprays into both nostrils daily. 8.6 g 11  . cetirizine HCl (ZYRTEC CHILDRENS ALLERGY) 5 MG/5ML SYRP Take 5 mLs (5 mg total) by mouth daily. 300 mL   . Fluticasone-Salmeterol (ADVAIR DISKUS) 500-50 MCG/DOSE AEPB Inhale 1 puff into the lungs 2 (two) times daily. 60 each 5  . ibuprofen (ADVIL,MOTRIN) 600 MG tablet 2 (two) times daily as needed.  0  . loratadine (CLARITIN) 10 MG tablet Take 1 tablet (10 mg total) by mouth daily. 90 tablet 1  . montelukast (SINGULAIR) 10 MG tablet TAKE 1 TABLET BY MOUTH EVERYDAY AT BEDTIME 90 tablet 3  . OVER THE COUNTER MEDICATION Take 25 mg by mouth daily. Takes 4 tablets (100 mg) plus 2 prenatal vitamins.    . Prenatal Vit-Fe Fumarate-FA (PRENATAL MULTIVITAMIN) TABS tablet Take 1 tablet by mouth daily at 12 noon.    . vitamin C (ASCORBIC ACID) 500 MG tablet Take 500 mg by mouth daily.    . predniSONE (DELTASONE) 50 MG tablet Take 1 tablet (50 mg total) by mouth daily. 5 tablet 1   No current  facility-administered medications for this visit.    Allergies  Allergen Reactions  . Compazine [Prochlorperazine Edisylate] Other (See Comments)    seizure  . Corn-Containing Products Hives, Diarrhea and Other (See Comments)  . Peanut-Containing Drug Products Anaphylaxis  . Ppd [Tuberculin Purified Protein Derivative] Hives and Swelling    Patient stated,"can't receive the injection anymore per MD order. Must get the xray."  . Sesame Oil Hives  . Soybean Oil Anaphylaxis  . Eggs Or Egg-Derived Products Nausea And  Vomiting  . Other     Tree nuts, melon, gluten intolerance  . Shellfish Allergy Swelling  . Latex Hives and Rash    PDMP not reviewed this encounter. No orders of the defined types were placed in this encounter.  Meds ordered this encounter  Medications  . albuterol (PROVENTIL) (2.5 MG/3ML) 0.083% nebulizer solution    Sig: Take 3 mLs (2.5 mg total) by nebulization every 6 (six) hours as needed for wheezing or shortness of breath.    Dispense:  150 mL    Refill:  1  . albuterol (VENTOLIN HFA) 108 (90 Base) MCG/ACT inhaler    Sig: INHALE 2 PUFFS BY MOUTH EVERY 4 HOURS AS NEEDED FOR WHEEZE    Dispense:  18 g    Refill:  99    DX Code Needed  .  . budesonide (RHINOCORT AQUA) 32 MCG/ACT nasal spray    Sig: Place 2 sprays into both nostrils daily.    Dispense:  8.6 g    Refill:  11  . predniSONE (DELTASONE) 50 MG tablet    Sig: Take 1 tablet (50 mg total) by mouth daily.    Dispense:  5 tablet    Refill:  1

## 2019-04-27 ENCOUNTER — Telehealth: Payer: Self-pay | Admitting: Family Medicine

## 2019-04-27 NOTE — Telephone Encounter (Signed)
Forwarding to provider for review.

## 2019-04-27 NOTE — Telephone Encounter (Signed)
Pt states that she had a visit with Dr.Alexander this week and is still waiting on her work note in My Chart to be uploaded in epic as her and Dr.Alexander discussed. Please update pt on this. Thanks and her call back number is [phone]

## 2019-04-28 NOTE — Telephone Encounter (Signed)
I left pt a message informing her that her work note is in My-Chart under letters

## 2019-04-28 NOTE — Telephone Encounter (Signed)
Left a vm msg for pt regarding provider's note. Aware of [place] customer number. Direct call back info provided.

## 2019-04-28 NOTE — Telephone Encounter (Signed)
It's in there and should be visible. She might be looking in the wrong place? Please have her call [phone] Neurosurgeon customer service number)

## 2019-06-02 ENCOUNTER — Other Ambulatory Visit: Payer: Self-pay | Admitting: Family Medicine

## 2019-06-07 ENCOUNTER — Other Ambulatory Visit: Payer: Self-pay | Admitting: Osteopathic Medicine

## 2019-06-07 DIAGNOSIS — J4550 Severe persistent asthma, uncomplicated: Secondary | ICD-10-CM

## 2019-06-07 NOTE — Telephone Encounter (Signed)
Requested medication (s) are due for refill today: yes  Requested medication (s) are on the active medication list: yes  Last refill: 05/17/2019  Future visit scheduled: no  Notes to clinic:    One inhaler should last at least one month. If the patient is requesting refills earlier      Requested Prescriptions  Pending Prescriptions Disp Refills   albuterol (PROVENTIL) (2.5 MG/3ML) 0.083% nebulizer solution [Pharmacy Med Name: ALBUTEROL SUL 2.5 MG/3 ML SOLN] 150 mL 1    Sig: TAKE 3 MLS BY NEBULIZATION EVERY 6 (SIX) HOURS AS NEEDED FOR WHEEZING OR SHORTNESS OF BREATH.     Pulmonology:  Beta Agonists Failed - 06/07/2019  1:50 AM      Failed - One inhaler should last at least one month. If the patient is requesting refills earlier, contact the patient to check for uncontrolled symptoms.      Failed - Valid encounter within last 12 months    Recent Outpatient Visits          1 month ago Asthma, chronic, severe persistent, uncomplicated   [facility], [place], DO   4 months ago Moderate persistent asthma with acute exacerbation   [facility], Rebekah [place], MD   6 months ago Cough   [place] [facility], Rebekah [place], MD   8 months ago Diarrhea, unspecified type   [facility], Rebekah [place], MD   1 year ago Asthma, chronic, severe persistent, uncomplicated   [facility], Rebekah [place], MD

## 2019-07-06 ENCOUNTER — Telehealth: Payer: Self-pay | Admitting: Family Medicine

## 2019-07-06 NOTE — Telephone Encounter (Signed)
-----   Message from Gregor Hams, MD sent at 08/12/2018  7:42 AM EST ----- Regarding: Repeat CT scan f/u lung nodule Lung nodule on CT scan follow-up 12 months.

## 2019-07-06 NOTE — Telephone Encounter (Signed)
I have sent myself a reminder to make sure you get a CT scan of your chest about this time to follow-up a incidentally noted small lung nodule on a CT scan of your chest last year.  Your cardiologist I see has already done this for me.  Let me know if you have any problems getting the CT scan scheduled or arranged or approved.

## 2019-07-28 ENCOUNTER — Other Ambulatory Visit: Payer: Self-pay | Admitting: Family Medicine

## 2019-07-28 DIAGNOSIS — J4550 Severe persistent asthma, uncomplicated: Secondary | ICD-10-CM

## 2019-08-01 ENCOUNTER — Ambulatory Visit (INDEPENDENT_AMBULATORY_CARE_PROVIDER_SITE_OTHER): Payer: Federal, State, Local not specified - PPO | Admitting: Family Medicine

## 2019-08-01 ENCOUNTER — Encounter: Payer: Self-pay | Admitting: Family Medicine

## 2019-08-01 ENCOUNTER — Other Ambulatory Visit: Payer: Self-pay

## 2019-08-01 VITALS — Ht 66.0 in | Wt 128.8 lb

## 2019-08-01 DIAGNOSIS — J0101 Acute recurrent maxillary sinusitis: Secondary | ICD-10-CM | POA: Diagnosis not present

## 2019-08-01 DIAGNOSIS — J4550 Severe persistent asthma, uncomplicated: Secondary | ICD-10-CM

## 2019-08-01 MED ORDER — CEFDINIR 300 MG PO CAPS: 300.0000 mg | ORAL_CAPSULE | Freq: Two times a day (BID) | ORAL | 0 refills | Status: DC

## 2019-08-01 MED ORDER — PREDNISONE 50 MG PO TABS: 50.0000 mg | ORAL_TABLET | Freq: Every day | ORAL | 1 refills | Status: DC

## 2019-08-01 NOTE — Progress Notes (Signed)
Virtual Visit  via Video Note  I connected with      Tita M Weilbacher by a video enabled telemedicine application and verified that I am speaking with the correct person using two identifiers.   I discussed the limitations of evaluation and management by telemedicine and the availability of in person appointments. The patient expressed understanding and agreed to proceed.  History of Present Illness: Toni Harmon is a 43 y.o. female who would like to discuss sinus pain and pressure.  Patient has sinus congestion chest congestion sinus pain and pressure present for about a week.  She has been treating it with her chronic allergy and asthma medications as well as humidifier with Vicks VapoRub, Rhinocort and Afrin Sudafed.  She notes a little bit chest wheezing and chest tightness.  She also notes some itchy watery eyes.  She notes her symptoms are consistent with worsening asthma and allergies.  She thinks he may have developed a secondary bacterial sinus infection.     Observations/Objective: Ht 5\' 6"  (1.676 m)   Wt 128 lb 12.8 oz (58.4 kg)   BMI 20.79 kg/m  Wt Readings from Last 5 Encounters:  08/01/19 128 lb 12.8 oz (58.4 kg)  04/25/19 128 lb 12.8 oz (58.4 kg)  01/18/19 137 lb (62.1 kg)  11/30/18 137 lb (62.1 kg)  10/07/18 139 lb (63 kg)   Exam: Normal Speech.  No audible wheezing.  No tachypnea able to complete sentences.  Mild hoarse voice quality.    Assessment and Plan: 43 y.o. female with sinus congestion wheezing.  Likely developing allergy asthma with possible secondary bacterial sinus infection.  Plan to treat with prednisone and Omnicef.  Recheck if not improving.  Precautions reviewed.  PDMP not reviewed this encounter. No orders of the defined types were placed in this encounter.  Meds ordered this encounter  Medications  . predniSONE (DELTASONE) 50 MG tablet    Sig: Take 1 tablet (50 mg total) by mouth daily.    Dispense:  5 tablet    Refill:  1  .  cefdinir (OMNICEF) 300 MG capsule    Sig: Take 1 capsule (300 mg total) by mouth 2 (two) times daily.    Dispense:  14 capsule    Refill:  0    Follow Up Instructions:    I discussed the assessment and treatment plan with the patient. The patient was provided an opportunity to ask questions and all were answered. The patient agreed with the plan and demonstrated an understanding of the instructions.   The patient was advised to call back or seek an in-person evaluation if the symptoms worsen or if the condition fails to improve as anticipated.  Time: 15 minutes of intraservice time, with >22 minutes of total time during today's visit.      Historical information moved to improve visibility of documentation.  Past Medical History:  Diagnosis Date  . Asthma   . GERD (gastroesophageal reflux disease)   . Hypertension    only to combat the effects of hctz   Past Surgical History:  Procedure Laterality Date  . APPENDECTOMY    . DILATION AND CURETTAGE OF UTERUS  12/2011  . TONSILLECTOMY  2003  . TOOTH EXTRACTION     Social History   Tobacco Use  . Smoking status: Never Smoker  . Smokeless tobacco: Never Used  Substance Use Topics  . Alcohol use: No    Alcohol/week: 0.0 standard drinks   family history includes Cervical cancer (  age of onset: 29) in her mother; Diabetes in her maternal grandfather; Food Allergy in her mother; Hypertension in her mother.  Medications: Current Outpatient Medications  Medication Sig Dispense Refill  . albuterol (PROVENTIL) (2.5 MG/3ML) 0.083% nebulizer solution TAKE 3 MLS BY NEBULIZATION EVERY 6 HOURS AS NEEDED FOR WHEEZING OR SHORTNESS OF BREATH. 150 mL 1  . AMBULATORY NON FORMULARY MEDICATION Nebulizer with necessary accessories.  Dx: Moderate Persistent Asthma 1 Units 0  . budesonide (RHINOCORT AQUA) 32 MCG/ACT nasal spray Place 2 sprays into both nostrils daily. 8.6 g 11  . cetirizine HCl (ZYRTEC CHILDRENS ALLERGY) 5 MG/5ML SYRP Take 5 mLs  (5 mg total) by mouth daily. 300 mL   . Fluticasone-Salmeterol (ADVAIR DISKUS) 500-50 MCG/DOSE AEPB Inhale 1 puff into the lungs 2 (two) times daily. 60 each 5  . ibuprofen (ADVIL,MOTRIN) 600 MG tablet 2 (two) times daily as needed.  0  . loratadine (CLARITIN) 10 MG tablet Take 1 tablet (10 mg total) by mouth daily. 90 tablet 1  . montelukast (SINGULAIR) 10 MG tablet TAKE 1 TABLET BY MOUTH EVERYDAY AT BEDTIME 90 tablet 3  . OVER THE COUNTER MEDICATION Take 25 mg by mouth daily. Takes 4 tablets (100 mg) plus 2 prenatal vitamins.    . predniSONE (DELTASONE) 50 MG tablet Take 1 tablet (50 mg total) by mouth daily. 5 tablet 1  . Prenatal Vit-Fe Fumarate-FA (PRENATAL MULTIVITAMIN) TABS tablet Take 1 tablet by mouth daily at 12 noon.    . vitamin C (ASCORBIC ACID) 500 MG tablet Take 500 mg by mouth daily.    . cefdinir (OMNICEF) 300 MG capsule Take 1 capsule (300 mg total) by mouth 2 (two) times daily. 14 capsule 0   No current facility-administered medications for this visit.    Allergies  Allergen Reactions  . Compazine [Prochlorperazine Edisylate] Other (See Comments)    seizure  . Corn-Containing Products Hives, Diarrhea and Other (See Comments)  . Peanut-Containing Drug Products Anaphylaxis  . Ppd [Tuberculin Purified Protein Derivative] Hives and Swelling    Patient stated,"can't receive the injection anymore per MD order. Must get the xray."  . Sesame Oil Hives  . Soybean Oil Anaphylaxis  . Eggs Or Egg-Derived Products Nausea And Vomiting  . Other     Tree nuts, melon, gluten intolerance  . Shellfish Allergy Swelling  . Latex Hives and Rash

## 2019-08-03 ENCOUNTER — Encounter: Payer: Self-pay | Admitting: Family Medicine

## 2019-08-11 ENCOUNTER — Telehealth: Payer: Self-pay | Admitting: *Deleted

## 2019-08-11 NOTE — Telephone Encounter (Signed)
Called patient X 3 to schedule CT Chest WO appointment for DR [facility] left message to call back to [phone].  Called pt LMTCB to sch CT 9/9ss Called pt LMTCB to sch CT 9/14 Called pt LMTCB to sch CT 11/5ss

## 2019-08-12 ENCOUNTER — Telehealth: Payer: Self-pay | Admitting: Internal Medicine

## 2019-08-12 NOTE — Telephone Encounter (Signed)
FYI FW: CT CHEST WO CONTRAST Received: [employer]  Osvaldo Shipper, NT  Fidel Levy, RN        Eliezer Lofts I have tried to call this patient multiple times and have left messages to call to schedule CT Chest WO ordered by Dr Debara Pickett.   Called pt LMTCB to sch CT 9/9ss  Called pt LMTCB to sch CT 9/14  Called pt LMTCB to sch CT 11/5ss   Thank you   Erline Levine

## 2019-09-06 DIAGNOSIS — J45901 Unspecified asthma with (acute) exacerbation: Secondary | ICD-10-CM | POA: Diagnosis not present

## 2019-09-06 DIAGNOSIS — R05 Cough: Secondary | ICD-10-CM | POA: Diagnosis not present

## 2019-09-06 DIAGNOSIS — Z20828 Contact with and (suspected) exposure to other viral communicable diseases: Secondary | ICD-10-CM | POA: Diagnosis not present

## 2019-09-07 DIAGNOSIS — Z20828 Contact with and (suspected) exposure to other viral communicable diseases: Secondary | ICD-10-CM | POA: Diagnosis not present

## 2019-09-13 DIAGNOSIS — M2142 Flat foot [pes planus] (acquired), left foot: Secondary | ICD-10-CM | POA: Diagnosis not present

## 2019-09-13 DIAGNOSIS — M2141 Flat foot [pes planus] (acquired), right foot: Secondary | ICD-10-CM | POA: Diagnosis not present

## 2019-09-13 DIAGNOSIS — B351 Tinea unguium: Secondary | ICD-10-CM | POA: Diagnosis not present

## 2019-09-20 ENCOUNTER — Telehealth: Payer: Self-pay | Admitting: Family Medicine

## 2019-09-20 DIAGNOSIS — J4550 Severe persistent asthma, uncomplicated: Secondary | ICD-10-CM

## 2019-09-22 NOTE — Telephone Encounter (Signed)
Patient needs an establish care appointment.

## 2019-09-23 NOTE — Telephone Encounter (Signed)
Left patient a voicemail with information below. Let patient know to call us back to schedule an appointment. 

## 2019-10-12 ENCOUNTER — Ambulatory Visit (INDEPENDENT_AMBULATORY_CARE_PROVIDER_SITE_OTHER): Payer: Federal, State, Local not specified - PPO | Admitting: Physician Assistant

## 2019-10-12 VITALS — Ht 66.0 in | Wt 128.0 lb

## 2019-10-12 DIAGNOSIS — J4531 Mild persistent asthma with (acute) exacerbation: Secondary | ICD-10-CM | POA: Diagnosis not present

## 2019-10-12 DIAGNOSIS — J4550 Severe persistent asthma, uncomplicated: Secondary | ICD-10-CM | POA: Diagnosis not present

## 2019-10-12 DIAGNOSIS — J4541 Moderate persistent asthma with (acute) exacerbation: Secondary | ICD-10-CM | POA: Diagnosis not present

## 2019-10-12 MED ORDER — FLUTICASONE-SALMETEROL 500-50 MCG/DOSE IN AEPB: 1.0000 | INHALATION_SPRAY | Freq: Two times a day (BID) | RESPIRATORY_TRACT | 5 refills | Status: DC

## 2019-10-12 MED ORDER — ALBUTEROL SULFATE (2.5 MG/3ML) 0.083% IN NEBU: 2.5000 mg | INHALATION_SOLUTION | Freq: Four times a day (QID) | RESPIRATORY_TRACT | 1 refills | Status: DC | PRN

## 2019-10-12 MED ORDER — PREDNISONE 20 MG PO TABS: ORAL_TABLET | ORAL | 0 refills | Status: DC

## 2019-10-12 NOTE — Progress Notes (Signed)
Patient ID: Toni Harmon, female   DOB: [birth year], 44 y.o.   MRN: [id number] .Marland KitchenVirtual Visit via Video Note  I connected with Toni Harmon on 10/13/2019 at 10:50 AM EST by a video enabled telemedicine application and verified that I am speaking with the correct person using two identifiers.  Location: Patient: home Provider: clinic   I discussed the limitations of evaluation and management by telemedicine and the availability of in person appointments. The patient expressed understanding and agreed to proceed.  History of Present Illness: Pt is a 43 yo female with hx of asthma and allergies who is on rhinocort, zyrtec, advair, albuterol and for the last few days having more chest tightness and cough. 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 It is getting harder to work during this flare. She has to stay at home yesterday and now needs a note. She is using albuterol more often. She denies any fever, chills, sinus pressure, loss or smell or taste, GI symptoms, nasal congestion.   She needs note to also be able to de-mask and use neb at work.    .. Active Ambulatory Problems    Diagnosis Date Noted  . Essential hypertension 11/16/2012  . Moderate persistent asthma 11/16/2012  . Degeneration of cervical intervertebral disc 11/16/2012  . Allergic rhinitis 11/16/2012  . IBS (irritable bowel syndrome) 11/16/2012  . Anxiety and depression 11/16/2012  . Onychomycosis 03/19/2016  . Vitamin D deficiency 09/02/2016  . Iron deficiency anemia 09/02/2016  . Ingrown toenail 06/30/2017  . Food allergy, peanut 08/18/2012  . 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 Family history of cervical cancer 12/25/2017  . HLD (hyperlipidemia) 12/28/2017  . Lung nodule < 6cm on CT 08/12/2018   Resolved Ambulatory Problems    Diagnosis Date Noted  . Allergic rhinitis 11/14/2015  . Food allergy 01/21/2017   Past Medical History:  Diagnosis Date  . Asthma   . GERD  (gastroesophageal reflux disease)   . Hypertension    Reviewed med, allergy, problem list.  Observations/Objective: No acute distress.  Normal respirations.  No wheezing or coughing heard on exam.   .. Today's Vitals   10/12/19 1025  Weight: 128 lb (58.1 kg)  Height: 5\' 6"  (1.676 m)   Body mass index is 20.66 kg/m.   Assessment and Plan: Marland KitchenLannette was seen today for asthma.  Diagnoses and all orders for this visit:  Moderate persistent asthma with acute exacerbation -     predniSONE (DELTASONE) 20 MG tablet; Take 3 tablet for 3 days, take 2 tablet for 3 days, take 1 tablet for 3 days, take 1/2 tablet for 4 days.  Mild persistent asthma with exacerbation  Asthma, chronic, severe persistent, uncomplicated -     albuterol (PROVENTIL) (2.5 MG/3ML) 0.083% nebulizer solution; Take 3 mLs (2.5 mg total) by nebulization every 6 (six) hours as needed for wheezing or shortness of breath. -     Fluticasone-Salmeterol (ADVAIR DISKUS) 500-50 MCG/DOSE AEPB; Inhale 1 puff into the lungs 2 (two) times daily.   Written out until 11th. Start prednisone continue on preventative medications. Note given to allow area to nebulize at work. Will fill out paperwork for time missed. Consider vitamin C 1000mg  and zinc 50mg  daily for immune support. Follow up as needed or with any new symptoms.    Follow Up Instructions:    I discussed the assessment and treatment plan with the patient. The patient was provided an opportunity to ask questions and all were  answered. The patient agreed with the plan and demonstrated an understanding of the instructions.   The patient was advised to call back or seek an in-person evaluation if the symptoms worsen or if the condition fails to improve as anticipated.    Iran Planas, PA-C

## 2019-10-12 NOTE — Progress Notes (Deleted)
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

## 2019-10-13 ENCOUNTER — Encounter: Payer: Self-pay | Admitting: Physician Assistant

## 2019-11-07 ENCOUNTER — Telehealth (INDEPENDENT_AMBULATORY_CARE_PROVIDER_SITE_OTHER): Payer: Federal, State, Local not specified - PPO | Admitting: Family Medicine

## 2019-11-07 ENCOUNTER — Encounter: Payer: Self-pay | Admitting: Family Medicine

## 2019-11-07 DIAGNOSIS — J4550 Severe persistent asthma, uncomplicated: Secondary | ICD-10-CM | POA: Diagnosis not present

## 2019-11-07 DIAGNOSIS — Z91010 Allergy to peanuts: Secondary | ICD-10-CM

## 2019-11-07 MED ORDER — ALBUTEROL SULFATE HFA 108 (90 BASE) MCG/ACT IN AERS: 2.0000 | INHALATION_SPRAY | Freq: Four times a day (QID) | RESPIRATORY_TRACT | 0 refills | Status: DC | PRN

## 2019-11-07 MED ORDER — ALBUTEROL SULFATE (2.5 MG/3ML) 0.083% IN NEBU: 2.5000 mg | INHALATION_SOLUTION | Freq: Four times a day (QID) | RESPIRATORY_TRACT | 1 refills | Status: DC | PRN

## 2019-11-07 MED ORDER — FLUTICASONE-SALMETEROL 500-50 MCG/DOSE IN AEPB: 1.0000 | INHALATION_SPRAY | Freq: Two times a day (BID) | RESPIRATORY_TRACT | 5 refills | Status: DC

## 2019-11-07 MED ORDER — EPINEPHRINE 0.3 MG/0.3ML IJ SOAJ: 0.3000 mg | INTRAMUSCULAR | 0 refills | Status: DC | PRN

## 2019-11-07 NOTE — Assessment & Plan Note (Signed)
Stable at this time.  She will continue daily advair and singulair with albuterol as needed.  Avoid allergens and triggers the best she can.

## 2019-11-07 NOTE — Assessment & Plan Note (Signed)
>>  ASSESSMENT AND PLAN FOR MODERATE PERSISTENT ASTHMA WRITTEN ON 11/07/2019 11:49 AM BY MATTHEWS, CODY, DO  Stable at this time.  She will continue daily advair and singulair  with albuterol  as needed.  Avoid allergens and triggers the best she can.

## 2019-11-07 NOTE — Progress Notes (Addendum)
Toni Harmon - 44 y.o. female MRN [id number]  Date of birth: [birth year]   This visit type was conducted due to national recommendations for restrictions regarding the COVID-19 Pandemic (e.g. social distancing).  This format is felt to be most appropriate for this patient at this time.  All issues noted in this document were discussed and addressed.  No physical exam was performed (except for noted visual exam findings with Video Visits).  I discussed the limitations of evaluation and management by telemedicine and the availability of in person appointments. The patient expressed understanding and agreed to proceed.  I connected with@ on 11/07/19 at 11:30 AM EST by a video enabled telemedicine application and verified that I am speaking with the correct person using two identifiers.  Present at visit: Toni Coombe, DO Toni Harmon   Patient Location: Home [address] [place] [address]   Provider location:   [facility]  Chief Complaint  Patient presents with  . Medication Management    HPI  Toni Harmon is a 44 y.o. female who presents via audio/video conferencing for a telehealth visit today.  She is following up today for allergies and asthma.  She needs refills on albuterol inhaler and neb solution as well as epi-pen.  She is using controller inhaler daily as well as singulair and antihistamine.  Symptoms are fairly well controlled.  She did have flare earlier this month requiring prednisone.  Her symptoms have been exacerbated some by mask wearing.    ROS:  A comprehensive ROS was completed and negative except as noted per HPI  Past Medical History:  Diagnosis Date  . Asthma   . GERD (gastroesophageal reflux disease)   . Hypertension    only to combat the effects of hctz    Past Surgical History:  Procedure Laterality Date  . APPENDECTOMY    . DILATION AND CURETTAGE OF UTERUS  12/2011  . TONSILLECTOMY  2003  . TOOTH  EXTRACTION      Family History  Problem Relation Age of Onset  . Cervical cancer Mother 2  . Hypertension Mother   . Food Allergy Mother   . Diabetes Maternal Grandfather     Social History   Socioeconomic History  . Marital status: Married    Spouse name: Not on file  . Number of children: Not on file  . Years of education: Not on file  . Highest education level: Not on file  Occupational History  . Not on file  Tobacco Use  . Smoking status: Never Smoker  . Smokeless tobacco: Never Used  Substance and Sexual Activity  . Alcohol use: No    Alcohol/week: 0.0 standard drinks  . Drug use: No  . Sexual activity: Yes    Partners: Male    Birth control/protection: None  Other Topics Concern  . Not on file  Social History Narrative  . Not on file   Social Determinants of Health   Financial Resource Strain:   . Difficulty of Paying Living Expenses: Not on file  Food Insecurity:   . Worried About Programme researcher, broadcasting/film/video in the Last Year: Not on file  . Ran Out of Food in the Last Year: Not on file  Transportation Needs:   . Lack of Transportation (Medical): Not on file  . Lack of Transportation (Non-Medical): Not on file  Physical Activity:   . Days of Exercise per Week: Not on file  . Minutes of Exercise per Session: Not  on file  Stress:   . Feeling of Stress : Not on file  Social Connections:   . Frequency of Communication with Friends and Family: Not on file  . Frequency of Social Gatherings with Friends and Family: Not on file  . Attends Religious Services: Not on file  . Active Member of Clubs or Organizations: Not on file  . Attends Banker Meetings: Not on file  . Marital Status: Not on file  Intimate Partner Violence:   . Fear of Current or Ex-Partner: Not on file  . Emotionally Abused: Not on file  . Physically Abused: Not on file  . Sexually Abused: Not on file     Current Outpatient Medications:  .  albuterol (PROVENTIL) (2.5 MG/3ML)  0.083% nebulizer solution, Take 3 mLs (2.5 mg total) by nebulization every 6 (six) hours as needed for wheezing or shortness of breath., Disp: 150 mL, Rfl: 1 .  AMBULATORY NON FORMULARY MEDICATION, Nebulizer with necessary accessories.  Dx: Moderate Persistent Asthma, Disp: 1 Units, Rfl: 0 .  budesonide (RHINOCORT AQUA) 32 MCG/ACT nasal spray, Place 2 sprays into both nostrils daily., Disp: 8.6 g, Rfl: 11 .  cetirizine HCl (ZYRTEC CHILDRENS ALLERGY) 5 MG/5ML SYRP, Take 5 mLs (5 mg total) by mouth daily., Disp: 300 mL, Rfl:  .  Fluticasone-Salmeterol (ADVAIR DISKUS) 500-50 MCG/DOSE AEPB, Inhale 1 puff into the lungs 2 (two) times daily., Disp: 60 each, Rfl: 5 .  ibuprofen (ADVIL,MOTRIN) 600 MG tablet, 2 (two) times daily as needed., Disp: , Rfl: 0 .  loratadine (CLARITIN) 10 MG tablet, Take 1 tablet (10 mg total) by mouth daily., Disp: 90 tablet, Rfl: 1 .  montelukast (SINGULAIR) 10 MG tablet, TAKE 1 TABLET BY MOUTH EVERYDAY AT BEDTIME, Disp: 90 tablet, Rfl: 3 .  predniSONE (DELTASONE) 20 MG tablet, Take 3 tablet for 3 days, take 2 tablet for 3 days, take 1 tablet for 3 days, take 1/2 tablet for 4 days., Disp: 20 tablet, Rfl: 0 .  Prenatal Vit-Fe Fumarate-FA (PRENATAL MULTIVITAMIN) TABS tablet, Take 1 tablet by mouth daily at 12 noon., Disp: , Rfl:  .  vitamin C (ASCORBIC ACID) 500 MG tablet, Take 500 mg by mouth daily., Disp: , Rfl:  .  albuterol (VENTOLIN HFA) 108 (90 Base) MCG/ACT inhaler, Inhale 2 puffs into the lungs every 6 (six) hours as needed for wheezing or shortness of breath., Disp: 18 g, Rfl: 0 .  EPINEPHrine (EPIPEN 2-PAK) 0.3 mg/0.3 mL IJ SOAJ injection, Inject 0.3 mLs (0.3 mg total) into the muscle as needed for anaphylaxis., Disp: 1 each, Rfl: 0  EXAM:  VITALS per patient if applicable: BP 109/70   Temp (!) 97.1 F (36.2 C) (Oral)   Wt 128 lb (58.1 kg)   LMP 10/14/2019 (Exact Date)   SpO2 98%   Breastfeeding No   BMI 20.66 kg/m   GENERAL: alert, oriented, appears well and in  no acute distress  HEENT: atraumatic, conjunttiva clear, no obvious abnormalities on inspection of external nose and ears  NECK: normal movements of the head and neck  LUNGS: on inspection no signs of respiratory distress, breathing rate appears normal, no obvious gross SOB, gasping or wheezing  CV: no obvious cyanosis  MS: moves all visible extremities without noticeable abnormality  PSYCH/NEURO: pleasant and cooperative, no obvious depression or anxiety, speech and thought processing grossly intact  ASSESSMENT AND PLAN:  Discussed the following assessment and plan:  Moderate persistent asthma Stable at this time.  She will continue daily advair  and singulair with albuterol as needed.  Avoid allergens and triggers the best she can.   Food allergy, peanut Epi-pen renewed  >20 minutes spent including pre visit preparation, review of prior notes and labs, encounter with patient via video visit and same day documentation.    I discussed the assessment and treatment plan with the patient. The patient was provided an opportunity to ask questions and all were answered. The patient agreed with the plan and demonstrated an understanding of the instructions.   The patient was advised to call back or seek an in-person evaluation if the symptoms worsen or if the condition fails to improve as anticipated.    Luetta Nutting, DO

## 2019-11-07 NOTE — Assessment & Plan Note (Signed)
Epipen renewed

## 2019-11-11 ENCOUNTER — Encounter: Payer: Self-pay | Admitting: Family Medicine

## 2019-11-11 NOTE — Telephone Encounter (Signed)
Yes, ok for a note.  Have her let me know if not continuing to improve.   You can list me as PCP for her. Thanks!

## 2019-12-22 ENCOUNTER — Encounter: Payer: Self-pay | Admitting: General Practice

## 2019-12-23 ENCOUNTER — Encounter: Payer: Self-pay | Admitting: Family Medicine

## 2019-12-23 ENCOUNTER — Ambulatory Visit (INDEPENDENT_AMBULATORY_CARE_PROVIDER_SITE_OTHER): Payer: Federal, State, Local not specified - PPO | Admitting: Family Medicine

## 2019-12-23 ENCOUNTER — Other Ambulatory Visit: Payer: Self-pay

## 2019-12-23 DIAGNOSIS — J4540 Moderate persistent asthma, uncomplicated: Secondary | ICD-10-CM

## 2019-12-23 DIAGNOSIS — F419 Anxiety disorder, unspecified: Secondary | ICD-10-CM | POA: Diagnosis not present

## 2019-12-23 DIAGNOSIS — F329 Major depressive disorder, single episode, unspecified: Secondary | ICD-10-CM

## 2019-12-23 DIAGNOSIS — I10 Essential (primary) hypertension: Secondary | ICD-10-CM | POA: Diagnosis not present

## 2019-12-23 DIAGNOSIS — F32A Depression, unspecified: Secondary | ICD-10-CM

## 2019-12-23 MED ORDER — HYDROCHLOROTHIAZIDE 25 MG PO TABS: 25.0000 mg | ORAL_TABLET | Freq: Every day | ORAL | 3 refills | Status: DC

## 2019-12-23 MED ORDER — ESCITALOPRAM OXALATE 10 MG PO TABS: 10.0000 mg | ORAL_TABLET | Freq: Every day | ORAL | 3 refills | Status: DC

## 2019-12-23 NOTE — Assessment & Plan Note (Signed)
Blood pressure is at goal at for age and co-morbidities.  I recommend she continue HCTZ 25mg .  Anxiety likely contributing to her elevated BP's.  In addition they were instructed to follow a low sodium diet with regular exercise to help to maintain adequate control of blood pressure.

## 2019-12-23 NOTE — Patient Instructions (Signed)

## 2019-12-23 NOTE — Progress Notes (Signed)
Toni Harmon - 44 y.o. female MRN [id number]  Date of birth: 1976-04-02  Subjective No chief complaint on file.   HPI Toni Harmon is a 44 y.o. female with history of asthma, generalized anxiety disorder and HTN here today for follow up.  -Anxiety:  History of anxiety worsened due to worrying about COVID and her asthma.  She has seen therapist through the [place] but is not currently on medication.  She had previously taken bupropion but doesn't recall if this was helpful for her or not.  She needs intermittent FMLA for work completed as well.   Depression screen [facility] 2/9 12/23/2019 11/07/2019 01/01/2018  Decreased Interest 3 0 0  Down, Depressed, Hopeless 0 0 0  PHQ - 2 Score 3 0 0  Altered sleeping 1 - 2  Tired, decreased energy 3 - 2  Change in appetite 3 - 1  Feeling bad or failure about yourself  0 - 0  Trouble concentrating 3 - 2  Moving slowly or fidgety/restless 3 - 0  Suicidal thoughts 0 - 0  PHQ-9 Score 16 - 7  Difficult doing work/chores Extremely dIfficult - Somewhat difficult     -HTN: BP at home has been elevated.  She had some left over HCTZ and has started taking this.  BP well controlled today on HCTZ.  She denies chest pain, shortness of breath, palpitations, headache or vision changes.   -Asthma:  Asthma has been fairly well controlled with current medications including daily Advair and singulair with albuetrol prn.  She has decided to wait on getting COVID vaccine for now as she reports allergic reactions to other vaccines in the past.    ROS:  A comprehensive ROS was completed and negative except as noted per HPI    Allergies  Allergen Reactions  . Compazine [Prochlorperazine Edisylate] Other (See Comments)    seizure  . Corn-Containing Products Hives, Diarrhea and Other (See Comments)  . Peanut-Containing Drug Products Anaphylaxis  . Ppd [Tuberculin Purified Protein Derivative] Hives and Swelling    Patient stated,"can't receive the injection anymore  per MD order. Must get the xray."  . Sesame Oil Hives  . Soybean Oil Anaphylaxis  . Eggs Or Egg-Derived Products Nausea And Vomiting  . Other     Tree nuts, melon, gluten intolerance  . Shellfish Allergy Swelling  . Latex Hives and Rash    Past Medical History:  Diagnosis Date  . Asthma   . GERD (gastroesophageal reflux disease)   . Hypertension    only to combat the effects of hctz    Past Surgical History:  Procedure Laterality Date  . APPENDECTOMY    . DILATION AND CURETTAGE OF UTERUS  12/2011  . TONSILLECTOMY  2003  . TOOTH EXTRACTION      Social History   Socioeconomic History  . Marital status: Married    Spouse name: Not on file  . Number of children: Not on file  . Years of education: Not on file  . Highest education level: Not on file  Occupational History  . Not on file  Tobacco Use  . Smoking status: Never Smoker  . Smokeless tobacco: Never Used  Substance and Sexual Activity  . Alcohol use: No    Alcohol/week: 0.0 standard drinks  . Drug use: No  . Sexual activity: Yes    Partners: Male    Birth control/protection: None  Other Topics Concern  . Not on file  Social History Narrative  . Not on file  Social Determinants of Health   Financial Resource Strain:   . Difficulty of Paying Living Expenses:   Food Insecurity:   . Worried About Charity fundraiser in the Last Year:   . Arboriculturist in the Last Year:   Transportation Needs:   . Film/video editor (Medical):   Toni Harmon Lack of Transportation (Non-Medical):   Physical Activity:   . Days of Exercise per Week:   . Minutes of Exercise per Session:   Stress:   . Feeling of Stress :   Social Connections:   . Frequency of Communication with Friends and Family:   . Frequency of Social Gatherings with Friends and Family:   . Attends Religious Services:   . Active Member of Clubs or Organizations:   . Attends Archivist Meetings:   Toni Harmon Marital Status:     Family History  Problem  Relation Age of Onset  . Cervical cancer Mother 70  . Hypertension Mother   . Food Allergy Mother   . Diabetes Maternal Grandfather     Health Maintenance  Topic Date Due  . PAP SMEAR-Modifier  12/23/2019 (Originally 03/18/2018)  . TETANUS/TDAP  04/24/2020 (Originally 03/28/1996)  . HIV Screening  Completed  . INFLUENZA VACCINE  Discontinued     ----------------------------------------------------------------------------------------------------------------------------------------------------------------------------------------------------------------- Physical Exam BP 124/86   Pulse 83   Temp 98.3 F (36.8 C) (Oral)   Ht 5\' 6"  (1.676 m)   Wt 141 lb (64 kg)   BMI 22.76 kg/m   Physical Exam Constitutional:      Appearance: Normal appearance.  HENT:     Head: Normocephalic and atraumatic.  Eyes:     General: No scleral icterus. Cardiovascular:     Rate and Rhythm: Normal rate and regular rhythm.  Pulmonary:     Effort: Pulmonary effort is normal.     Breath sounds: Normal breath sounds.  Skin:    General: Skin is warm and dry.  Neurological:     Mental Status: She is alert.  Psychiatric:        Mood and Affect: Mood normal.        Behavior: Behavior normal.     ------------------------------------------------------------------------------------------------------------------------------------------------------------------------------------------------------------------- Assessment and Plan  Essential hypertension Blood pressure is at goal at for age and co-morbidities.  I recommend she continue HCTZ 25mg .  Anxiety likely contributing to her elevated BP's.  In addition they were instructed to follow a low sodium diet with regular exercise to help to maintain adequate control of blood pressure.    Anxiety and depression GAD7 and PHQ9 indicated mild depressive symptoms with significant anxiety.  Discussed medication to help with managing her symptoms.  Will start  lexapro 5mg  initially with titration to 10mg  after 1 week. Reviewed potential side effects.  Continue counseling through [place].  FMLA paperwork completed.  Moderate persistent asthma Stable with current medications, continue Discussed COVID vaccine and that risk of anaphylaxis with this is quite low.  She will continue to wait until more data is available.    Meds ordered this encounter  Medications  . escitalopram (LEXAPRO) 10 MG tablet    Sig: Take 1 tablet (10 mg total) by mouth daily. Take 5mg  x7 days then increase to 10mg     Dispense:  30 tablet    Refill:  3  . hydrochlorothiazide (HYDRODIURIL) 25 MG tablet    Sig: Take 1 tablet (25 mg total) by mouth daily.    Dispense:  90 tablet    Refill:  3  Return in about 6 weeks (around 02/03/2020) for HTN/Anxiety.    This visit occurred during the SARS-CoV-2 public health emergency.  Safety protocols were in place, including screening questions prior to the visit, additional usage of staff PPE, and extensive cleaning of exam room while observing appropriate contact time as indicated for disinfecting solutions.

## 2019-12-23 NOTE — Assessment & Plan Note (Signed)
GAD7 and PHQ9 indicated mild depressive symptoms with significant anxiety.  Discussed medication to help with managing her symptoms.  Will start lexapro 5mg  initially with titration to 10mg  after 1 week. Reviewed potential side effects.  Continue counseling through .  FMLA paperwork completed.

## 2019-12-23 NOTE — Assessment & Plan Note (Signed)
>>  ASSESSMENT AND PLAN FOR MODERATE PERSISTENT ASTHMA WRITTEN ON 12/23/2019  2:13 PM BY MATTHEWS, CODY, DO  Stable with current medications, continue Discussed COVID vaccine and that risk of anaphylaxis with this is quite low.  She will continue to wait until more data is available.

## 2019-12-23 NOTE — Assessment & Plan Note (Signed)
Stable with current medications, continue Discussed COVID vaccine and that risk of anaphylaxis with this is quite low.  She will continue to wait until more data is available.

## 2019-12-29 ENCOUNTER — Encounter: Payer: Self-pay | Admitting: Family Medicine

## 2019-12-29 NOTE — Telephone Encounter (Signed)
Routing to provider  

## 2020-01-03 ENCOUNTER — Ambulatory Visit: Payer: Federal, State, Local not specified - PPO | Admitting: Medical-Surgical

## 2020-01-06 ENCOUNTER — Other Ambulatory Visit: Payer: Self-pay | Admitting: Family Medicine

## 2020-01-06 DIAGNOSIS — J4550 Severe persistent asthma, uncomplicated: Secondary | ICD-10-CM

## 2020-01-09 NOTE — Telephone Encounter (Signed)
CM-Plz see refill req/thx dmf 

## 2020-01-16 ENCOUNTER — Encounter: Payer: Self-pay | Admitting: Family Medicine

## 2020-01-16 ENCOUNTER — Ambulatory Visit (INDEPENDENT_AMBULATORY_CARE_PROVIDER_SITE_OTHER): Payer: Federal, State, Local not specified - PPO | Admitting: Family Medicine

## 2020-01-16 DIAGNOSIS — F329 Major depressive disorder, single episode, unspecified: Secondary | ICD-10-CM

## 2020-01-16 DIAGNOSIS — F32A Depression, unspecified: Secondary | ICD-10-CM

## 2020-01-16 DIAGNOSIS — F419 Anxiety disorder, unspecified: Secondary | ICD-10-CM

## 2020-01-16 MED ORDER — ALPRAZOLAM 0.5 MG PO TABS: 0.2500 mg | ORAL_TABLET | Freq: Every day | ORAL | 1 refills | Status: DC | PRN

## 2020-01-16 MED ORDER — AEROCHAMBER PLUS MISC: 2 refills | Status: AC

## 2020-01-16 NOTE — Progress Notes (Signed)
Toni Harmon - 44 y.o. female MRN [id number]  Date of birth: [birth year]  Subjective Chief Complaint  Patient presents with  . Anxiety    HPI Toni Harmon is a 44 y.o. female here today for follow up of anxiety.  She reports that she continues to experience episodes of anxiety while at work due to her asthma and several co-workers not wearing masks.  She states that she has brought this to the attention of her employer but it has not really changed anything.  She has had some improvement in her baseline anxiety with lexapro but has episodes of panic where she feels tightness in her chest, shortness of breath, and overwhelming anxiety.  She is working with a therapist that she meets with weekly through the [place].    ROS:  A comprehensive ROS was completed and negative except as noted per HPI  Allergies  Allergen Reactions  . Compazine [Prochlorperazine Edisylate] Other (See Comments)    seizure  . Corn-Containing Products Hives, Diarrhea and Other (See Comments)  . Peanut-Containing Drug Products Anaphylaxis  . Ppd [Tuberculin Purified Protein Derivative] Hives and Swelling    Patient stated,"can't receive the injection anymore per MD order. Must get the xray."  . Sesame Oil Hives  . Soybean Oil Anaphylaxis  . Eggs Or Egg-Derived Products Nausea And Vomiting  . Other     Tree nuts, melon, gluten intolerance  . Shellfish Allergy Swelling  . Latex Hives and Rash    Past Medical History:  Diagnosis Date  . Asthma   . GERD (gastroesophageal reflux disease)   . Hypertension    only to combat the effects of hctz    Past Surgical History:  Procedure Laterality Date  . APPENDECTOMY    . DILATION AND CURETTAGE OF UTERUS  12/2011  . TONSILLECTOMY  2003  . TOOTH EXTRACTION      Social History   Socioeconomic History  . Marital status: Married    Spouse name: Not on file  . Number of children: Not on file  . Years of education: Not on file  . Highest education level:  Not on file  Occupational History  . Not on file  Tobacco Use  . Smoking status: Never Smoker  . Smokeless tobacco: Never Used  Substance and Sexual Activity  . Alcohol use: No    Alcohol/week: 0.0 standard drinks  . Drug use: No  . Sexual activity: Yes    Partners: Male    Birth control/protection: None  Other Topics Concern  . Not on file  Social History Narrative  . Not on file   Social Determinants of Health   Financial Resource Strain:   . Difficulty of Paying Living Expenses:   Food Insecurity:   . Worried About Charity fundraiser in the Last Year:   . Arboriculturist in the Last Year:   Transportation Needs:   . Film/video editor (Medical):   Marland Kitchen Lack of Transportation (Non-Medical):   Physical Activity:   . Days of Exercise per Week:   . Minutes of Exercise per Session:   Stress:   . Feeling of Stress :   Social Connections:   . Frequency of Communication with Friends and Family:   . Frequency of Social Gatherings with Friends and Family:   . Attends Religious Services:   . Active Member of Clubs or Organizations:   . Attends Archivist Meetings:   Marland Kitchen Marital Status:     Family History  Problem Relation Age of Onset  . Cervical cancer Mother 51  . Hypertension Mother   . Food Allergy Mother   . Diabetes Maternal Grandfather     Health Maintenance  Topic Date Due  . PAP SMEAR-Modifier  03/18/2018  . TETANUS/TDAP  04/24/2020 (Originally 03/28/1996)  . HIV Screening  Completed  . INFLUENZA VACCINE  Discontinued     ----------------------------------------------------------------------------------------------------------------------------------------------------------------------------------------------------------------- Physical Exam BP 124/76   Pulse 84   Temp 98.3 F (36.8 C) (Oral)   Ht 5\' 6"  (1.676 m)   Wt 145 lb 4.8 oz (65.9 kg)   BMI 23.45 kg/m   Physical Exam Constitutional:      Appearance: Normal appearance.  HENT:      Head: Normocephalic and atraumatic.  Skin:    General: Skin is warm and dry.  Neurological:     General: No focal deficit present.     Mental Status: She is alert.  Psychiatric:        Mood and Affect: Mood normal.        Behavior: Behavior normal.     ------------------------------------------------------------------------------------------------------------------------------------------------------------------------------------------------------------------- Assessment and Plan  Anxiety and depression Anxiety and depression is better controlled with lexapro however still having some breakthrough episodes of panic.  Episodes are fairly debilitating for her when they do occur. Will start alprazolam 0.25-0.5mg  as needed for these episodes.  She will continue to see her therapist through the as well.    Meds ordered this encounter  Medications  . Spacer/Aero-Holding Chambers (AEROCHAMBER PLUS) inhaler    Sig: Use as instructed    Dispense:  1 each    Refill:  2  . ALPRAZolam (XANAX) 0.5 MG tablet    Sig: Take 0.5-1 tablets (0.25-0.5 mg total) by mouth daily as needed for anxiety (panic attack).    Dispense:  15 tablet    Refill:  1    No follow-ups on file.    This visit occurred during the SARS-CoV-2 public health emergency.  Safety protocols were in place, including screening questions prior to the visit, additional usage of staff PPE, and extensive cleaning of exam room while observing appropriate contact time as indicated for disinfecting solutions.

## 2020-01-16 NOTE — Assessment & Plan Note (Signed)
Anxiety and depression is better controlled with lexapro however still having some breakthrough episodes of panic.  Episodes are fairly debilitating for her when they do occur. Will start alprazolam 0.25-0.5mg  as needed for these episodes.  She will continue to see her therapist through the [place] as well.

## 2020-01-16 NOTE — Patient Instructions (Signed)
Continue lexapro You may use alprazolam as needed for panic symptoms  Keep next follow up with me.      Panic Attack  A panic attack is when you suddenly feel very afraid, uncomfortable, or nervous (anxious). A panic attack can happen when you are scared or for no reason. A panic attack can feel like a serious problem. It can even feel like a heart attack or stroke. See your doctor when you have a panic attack to make sure you do not have a serious problem. Follow these instructions at home:  Take medicines only as told by your doctor.  If you feel worried or nervous, try not to have caffeine.  Take good care of your health. To do this: ? Eat healthy. Make sure to eat fresh fruits and vegetables, whole grains, lean meats, and low-fat dairy. ? Get enough sleep. Try to sleep for 7-8 hours each night. ? Exercise. Try to be active for 30 minutes 5 or more days a week. ? Do not smoke. Talk to your doctor if you need help quitting. ? Limit how much alcohol you drink:  If you are a woman who is not pregnant: try not to have more than 1 drink a day.  If you are a man: try not to have more than 2 drinks a day.  One drink equals 12 oz of beer, 5 oz of wine, or 1 oz of hard liquor.  Keep all follow-up visits as told by your doctor. This is important. Contact a doctor if:  Your symptoms do not get better.  Your symptoms get worse.  You are not able to take your medicines as told. Get help right away if:  You have thoughts of hurting yourself or others.  You have symptoms of a panic attack. Do not drive yourself to the hospital. Have someone else drive you or call an ambulance. If you feel like you may hurt yourself or others, or have thoughts about taking your own life, get help right away. You can go to your nearest emergency department or call:  Your local emergency services (911 in the [place]  A suicide crisis helpline, such as the National Suicide Prevention Lifeline at  347-557-0508. This is open 24 hours a day. Summary  A panic attack is when you suddenly feel very afraid, uncomfortable, or nervous (anxious).  See your doctor when you have a panic attack to make sure that you do not have another serious problem.  If you feel like you may hurt yourself or others, get help right away by calling 911. This information is not intended to replace advice given to you by your health care provider. Make sure you discuss any questions you have with your health care provider. Document Revised: 09/04/2017 Document Reviewed: 11/05/2016 Elsevier Patient Education  2020 [employer].

## 2020-01-17 ENCOUNTER — Encounter: Payer: Self-pay | Admitting: Family Medicine

## 2020-02-02 ENCOUNTER — Telehealth: Payer: Self-pay | Admitting: Family Medicine

## 2020-02-02 ENCOUNTER — Ambulatory Visit (INDEPENDENT_AMBULATORY_CARE_PROVIDER_SITE_OTHER): Payer: Federal, State, Local not specified - PPO | Admitting: Family Medicine

## 2020-02-02 ENCOUNTER — Other Ambulatory Visit: Payer: Self-pay

## 2020-02-02 ENCOUNTER — Encounter: Payer: Self-pay | Admitting: Family Medicine

## 2020-02-02 VITALS — BP 110/50 | HR 81 | Ht 66.14 in | Wt 145.9 lb

## 2020-02-02 DIAGNOSIS — R195 Other fecal abnormalities: Secondary | ICD-10-CM

## 2020-02-02 DIAGNOSIS — I10 Essential (primary) hypertension: Secondary | ICD-10-CM

## 2020-02-02 DIAGNOSIS — E7849 Other hyperlipidemia: Secondary | ICD-10-CM

## 2020-02-02 DIAGNOSIS — F419 Anxiety disorder, unspecified: Secondary | ICD-10-CM

## 2020-02-02 DIAGNOSIS — J4540 Moderate persistent asthma, uncomplicated: Secondary | ICD-10-CM

## 2020-02-02 DIAGNOSIS — D508 Other iron deficiency anemias: Secondary | ICD-10-CM

## 2020-02-02 DIAGNOSIS — F32A Depression, unspecified: Secondary | ICD-10-CM

## 2020-02-02 DIAGNOSIS — F329 Major depressive disorder, single episode, unspecified: Secondary | ICD-10-CM

## 2020-02-02 LAB — CBC
HCT: 42.9 % (ref 35.0–45.0)
Hemoglobin: 13.9 g/dL (ref 11.7–15.5)
MCH: 28.9 pg (ref 27.0–33.0)
MCHC: 32.4 g/dL (ref 32.0–36.0)
MCV: 89.2 fL (ref 80.0–100.0)
MPV: 8.9 fL (ref 7.5–12.5)
Platelets: 361 10*3/uL (ref 140–400)
RBC: 4.81 10*6/uL (ref 3.80–5.10)
RDW: 12.7 % (ref 11.0–15.0)
WBC: 4.6 10*3/uL (ref 3.8–10.8)

## 2020-02-02 LAB — LIPID PANEL
Cholesterol: 336 mg/dL — ABNORMAL HIGH (ref <200)
HDL: 79 mg/dL (ref >=50)
LDL Cholesterol (Calc): 234 mg/dL (calc) — ABNORMAL HIGH
Non-HDL Cholesterol (Calc): 257 mg/dL (calc) — ABNORMAL HIGH (ref <130)
Total CHOL/HDL Ratio: 4.3 (calc) (ref <5.0)
Triglycerides: 103 mg/dL (ref <150)

## 2020-02-02 LAB — COMPLETE METABOLIC PANEL WITH GFR
AG Ratio: 2.4 (calc) (ref 1.0–2.5)
ALT: 38 U/L — ABNORMAL HIGH (ref 6–29)
AST: 27 U/L (ref 10–30)
Albumin: 4.6 g/dL (ref 3.6–5.1)
Alkaline phosphatase (APISO): 51 U/L (ref 31–125)
BUN: 21 mg/dL (ref 7–25)
CO2: 27 mmol/L (ref 20–32)
Calcium: 10.2 mg/dL (ref 8.6–10.2)
Chloride: 102 mmol/L (ref 98–110)
Creat: 0.86 mg/dL (ref 0.50–1.10)
GFR, Est African American: 97 mL/min/{1.73_m2} (ref >=60)
GFR, Est Non African American: 83 mL/min/{1.73_m2} (ref >=60)
Globulin: 1.9 g/dL (calc) (ref 1.9–3.7)
Glucose, Bld: 88 mg/dL (ref 65–99)
Potassium: 4.3 mmol/L (ref 3.5–5.3)
Sodium: 140 mmol/L (ref 135–146)
Total Bilirubin: 0.3 mg/dL (ref 0.2–1.2)
Total Protein: 6.5 g/dL (ref 6.1–8.1)

## 2020-02-02 LAB — IRON,TIBC AND FERRITIN PANEL
%SAT: 19 % (calc) (ref 16–45)
Ferritin: 108 ng/mL (ref 16–232)
Iron: 59 ug/dL (ref 40–190)
TIBC: 310 mcg/dL (calc) (ref 250–450)

## 2020-02-02 MED ORDER — PREDNISONE 20 MG PO TABS: 20.0000 mg | ORAL_TABLET | Freq: Two times a day (BID) | ORAL | 0 refills | Status: AC

## 2020-02-02 MED ORDER — ESCITALOPRAM OXALATE 10 MG PO TABS: 10.0000 mg | ORAL_TABLET | Freq: Every day | ORAL | 3 refills | Status: DC

## 2020-02-02 NOTE — Telephone Encounter (Signed)
Pt would like to know if she can transfer care to Dr. Lyn Hollingshead

## 2020-02-02 NOTE — Patient Instructions (Signed)
Great to see you today! Have labs completed today.   We'll repeat the stool sample, as a single stool sample doesn't always rule out parasitic infection.  I spoke with Dr. Lyn Hollingshead and she is ok with taking you as her patient.  Recommend follow up in 3 months with her as long as labs are looking ok.

## 2020-02-02 NOTE — Progress Notes (Signed)
Toni Harmon - 44 y.o. female MRN [id number]  Date of birth: 1975/11/17  Subjective No chief complaint on file.   HPI Toni Harmon is a 44 y.o. female here today for follow up of anxiety.    -GAD:  Anxiety related to work conditions.  Concerned about COVID with history of asthma.  Several of her Co-workers do not wear masks or wear masks properly. She reports that she has spoke with management but nothing has changed.   She is also anxious about receiving vaccine.  She is seeing a therapist and has been working with her on strategies to help managed her anxiety better.  Current strategy is to change seating arrangement so that she is not facing other employees, this is helpful.     -Asthma: Increased symptoms recently as pollen during the spring tends to worsen her symptoms.  She is using albuterol more often.  She is using advair and singulair as directed.  She often needs course of steroids to get her through this time of year.    -Iron deficiency: History of iron deficiency with anemia requiring iron transfusion previously.  She is taking slow fe currently, tolerating well  -Stool issues;  Reported in the past that she notice something that looked like worms in her stool. Had negative O&P stool study.  She reports that she continues to have these episodes.  Denies pain with BM.  Stool is typically soft, not well formed.   ROS:  A comprehensive ROS was completed and negative except as noted per HPI  Allergies  Allergen Reactions  . Compazine [Prochlorperazine Edisylate] Other (See Comments)    seizure  . Corn-Containing Products Hives, Diarrhea and Other (See Comments)  . Peanut-Containing Drug Products Anaphylaxis  . Ppd [Tuberculin Purified Protein Derivative] Hives and Swelling    Patient stated,"can't receive the injection anymore per MD order. Must get the xray."  . Sesame Oil Hives  . Soybean Oil Anaphylaxis  . Eggs Or Egg-Derived Products Nausea And Vomiting  .  Other     Tree nuts, melon, gluten intolerance  . Shellfish Allergy Swelling  . Latex Hives and Rash    Past Medical History:  Diagnosis Date  . Asthma   . GERD (gastroesophageal reflux disease)   . Hypertension    only to combat the effects of hctz    Past Surgical History:  Procedure Laterality Date  . APPENDECTOMY    . DILATION AND CURETTAGE OF UTERUS  12/2011  . TONSILLECTOMY  2003  . TOOTH EXTRACTION      Social History   Socioeconomic History  . Marital status: Married    Spouse name: Not on file  . Number of children: Not on file  . Years of education: Not on file  . Highest education level: Not on file  Occupational History  . Not on file  Tobacco Use  . Smoking status: Never Smoker  . Smokeless tobacco: Never Used  Substance and Sexual Activity  . Alcohol use: No    Alcohol/week: 0.0 standard drinks  . Drug use: No  . Sexual activity: Yes    Partners: Male    Birth control/protection: None  Other Topics Concern  . Not on file  Social History Narrative  . Not on file   Social Determinants of Health   Financial Resource Strain:   . Difficulty of Paying Living Expenses:   Food Insecurity:   . Worried About Programme researcher, broadcasting/film/video in the Last Year:   .  Ran Out of Food in the Last Year:   Transportation Needs:   . Film/video editor (Medical):   Marland Kitchen Lack of Transportation (Non-Medical):   Physical Activity:   . Days of Exercise per Week:   . Minutes of Exercise per Session:   Stress:   . Feeling of Stress :   Social Connections:   . Frequency of Communication with Friends and Family:   . Frequency of Social Gatherings with Friends and Family:   . Attends Religious Services:   . Active Member of Clubs or Organizations:   . Attends Archivist Meetings:   Marland Kitchen Marital Status:     Family History  Problem Relation Age of Onset  . Cervical cancer Mother 7  . Hypertension Mother   . Food Allergy Mother   . Diabetes Maternal Grandfather      Health Maintenance  Topic Date Due  . PAP SMEAR-Modifier  03/18/2018  . TETANUS/TDAP  04/24/2020 (Originally 03/28/1996)  . HIV Screening  Completed  . INFLUENZA VACCINE  Discontinued     ----------------------------------------------------------------------------------------------------------------------------------------------------------------------------------------------------------------- Physical Exam BP (!) 110/50 (BP Location: Left Arm, Patient Position: Sitting, Cuff Size: Normal)   Pulse 81   Ht 5' 6.14" (1.68 m)   Wt 145 lb 14.4 oz (66.2 kg)   SpO2 100%   BMI 23.45 kg/m   Physical Exam Constitutional:      Appearance: Normal appearance.  HENT:     Head: Normocephalic and atraumatic.  Eyes:     General: No scleral icterus. Cardiovascular:     Rate and Rhythm: Normal rate and regular rhythm.  Pulmonary:     Effort: Pulmonary effort is normal.     Breath sounds: Normal breath sounds.  Musculoskeletal:     Cervical back: Neck supple.  Neurological:     General: No focal deficit present.     Mental Status: She is alert.  Psychiatric:        Mood and Affect: Mood normal.        Behavior: Behavior normal.     ------------------------------------------------------------------------------------------------------------------------------------------------------------------------------------------------------------------- Assessment and Plan  Moderate persistent asthma Having flare of asthma currently.  Continue current medications with addition of prednisone burst.   Iron deficiency anemia Update CBC and iron studies today.   HLD (hyperlipidemia) Update lipid panel.   Abnormal findings in stool May be due to IBS and mucus in her stool.  Will check O&P once more .   Anxiety and depression Stable at this time.  She will continue current medications and continue following with counselor.    Meds ordered this encounter  Medications  . predniSONE  (DELTASONE) 20 MG tablet    Sig: Take 1 tablet (20 mg total) by mouth 2 (two) times daily with a meal for 5 days.    Dispense:  10 tablet    Refill:  0  . escitalopram (LEXAPRO) 10 MG tablet    Sig: Take 1 tablet (10 mg total) by mouth daily.    Dispense:  30 tablet    Refill:  3    No follow-ups on file.  She requests to transfer to Dr. Sheppard Coil due to preference in having a female provider.    This visit occurred during the SARS-CoV-2 public health emergency.  Safety protocols were in place, including screening questions prior to the visit, additional usage of staff PPE, and extensive cleaning of exam room while observing appropriate contact time as indicated for disinfecting solutions.

## 2020-02-02 NOTE — Telephone Encounter (Signed)
Fine by me, also fine with Dr. Ashley Royalty, verbal confirmation received from him

## 2020-02-03 DIAGNOSIS — D508 Other iron deficiency anemias: Secondary | ICD-10-CM | POA: Diagnosis not present

## 2020-02-03 DIAGNOSIS — R195 Other fecal abnormalities: Secondary | ICD-10-CM | POA: Diagnosis not present

## 2020-02-03 DIAGNOSIS — E7849 Other hyperlipidemia: Secondary | ICD-10-CM | POA: Diagnosis not present

## 2020-02-03 DIAGNOSIS — I10 Essential (primary) hypertension: Secondary | ICD-10-CM | POA: Diagnosis not present

## 2020-02-05 DIAGNOSIS — R195 Other fecal abnormalities: Secondary | ICD-10-CM | POA: Insufficient documentation

## 2020-02-05 NOTE — Assessment & Plan Note (Signed)
Having flare of asthma currently.  Continue current medications with addition of prednisone burst.

## 2020-02-05 NOTE — Assessment & Plan Note (Signed)
>>  ASSESSMENT AND PLAN FOR MODERATE PERSISTENT ASTHMA WRITTEN ON 02/05/2020 10:38 PM BY MATTHEWS, CODY, DO  Having flare of asthma currently.  Continue current medications with addition of prednisone  burst.

## 2020-02-05 NOTE — Assessment & Plan Note (Signed)
May be due to IBS and mucus in her stool.  Will check O&P once more .

## 2020-02-05 NOTE — Assessment & Plan Note (Signed)
Update CBC and iron studies today 

## 2020-02-05 NOTE — Assessment & Plan Note (Signed)
Stable at this time.  She will continue current medications and continue following with counselor.

## 2020-02-05 NOTE — Assessment & Plan Note (Signed)
Update lipid panel.  

## 2020-02-07 LAB — OVA AND PARASITE EXAMINATION
CONCENTRATE RESULT:: NONE SEEN
MICRO NUMBER:: 10426954
SPECIMEN QUALITY:: ADEQUATE
TRICHROME RESULT:: NONE SEEN

## 2020-02-10 DIAGNOSIS — Z1231 Encounter for screening mammogram for malignant neoplasm of breast: Secondary | ICD-10-CM | POA: Diagnosis not present

## 2020-02-10 DIAGNOSIS — Z803 Family history of malignant neoplasm of breast: Secondary | ICD-10-CM | POA: Diagnosis not present

## 2020-02-10 DIAGNOSIS — N6321 Unspecified lump in the left breast, upper outer quadrant: Secondary | ICD-10-CM | POA: Diagnosis not present

## 2020-02-17 ENCOUNTER — Encounter: Payer: Self-pay | Admitting: Osteopathic Medicine

## 2020-02-17 ENCOUNTER — Other Ambulatory Visit: Payer: Self-pay

## 2020-02-17 ENCOUNTER — Telehealth (INDEPENDENT_AMBULATORY_CARE_PROVIDER_SITE_OTHER): Payer: Federal, State, Local not specified - PPO | Admitting: Osteopathic Medicine

## 2020-02-17 VITALS — BP 110/72 | Wt 145.0 lb

## 2020-02-17 DIAGNOSIS — J4541 Moderate persistent asthma with (acute) exacerbation: Secondary | ICD-10-CM

## 2020-02-17 MED ORDER — EPINEPHRINE 0.3 MG/0.3ML IJ SOAJ: 0.3000 mg | INTRAMUSCULAR | 0 refills | Status: DC | PRN

## 2020-02-17 MED ORDER — PREDNISONE 20 MG PO TABS: 20.0000 mg | ORAL_TABLET | Freq: Two times a day (BID) | ORAL | 2 refills | Status: DC

## 2020-02-17 NOTE — Progress Notes (Signed)
Virtual Visit via Video (App used: MyChart) Note  I connected with      Toni Harmon on 02/17/20 at 10:35 AM  by a telemedicine application and verified that I am speaking with the correct person using two identifiers.  Patient is at home I am in office   I discussed the limitations of evaluation and management by telemedicine and the availability of in person appointments. The patient expressed understanding and agreed to proceed.  History of Present Illness: Toni Harmon is a 44 y.o. female who would like to discuss asthma getting worse - attributes current flare to seasonal allergies. Using albuterol neb and inhaler, taking Advair bid, taking Singulair 10 mg qhs, Claritin 10 mg daily. Unable to use nebulizer at work. 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 She would feel safer being able to stay home so she does not get a COVID infection from coworkers while she is having an asthma flare, and also so she can better manage her breathing with nebulizer while helps more than the inhaler does. Currently hesitant to get vaccinated herself due to previous bad allergic reactions to vaccines.     Observations/Objective: BP 110/72   Wt 145 lb (65.8 kg)   BMI 23.30 kg/m  BP Readings from Last 3 Encounters:  02/17/20 110/72  02/02/20 (!) 110/50  01/16/20 124/76   Exam: Normal Speech.  NAD  Lab and Radiology Results No results found for this or any previous visit (from the past 72 hour(s)). No results found.     Assessment and Plan: 44 y.o. female with The encounter diagnosis was Moderate persistent asthma with exacerbation.  Will Rx prednisone burst w/ refills prn. Pt advised if she needs to take steroid burst for flare in the future for flare not severe enough for emergency medical evaluation, can call pharmacy to refill meds and call me to let me know  she's done so.   Advised pt if she does change her mind about the vaccine, wait 2 weeks after last dose of steroids. I did recommend vaccination but I understand her reluctance.   Will write her out of work until she can safely be masked at all times and until she is in a less vulnerable medical state. Recommended reporting her employer for noncompliance with COVID masking mandates.    PDMP not reviewed this encounter. No orders of the defined types were placed in this encounter.  Meds ordered this encounter  Medications  . predniSONE (DELTASONE) 20 MG tablet    Sig: Take 1 tablet (20 mg total) by mouth 2 (two) times daily with a meal.    Dispense:  10 tablet    Refill:  2  . EPINEPHrine (EPIPEN 2-PAK) 0.3 mg/0.3 mL IJ SOAJ injection    Sig: Inject 0.3 mLs (0.3 mg total) into the muscle as needed for anaphylaxis.    Dispense:  1 each    Refill:  0     Follow Up Instructions: Return if symptoms worsen or fail to improve.    I discussed the assessment and treatment plan with the patient. The patient was provided an opportunity to ask questions and all were answered. The patient agreed with the plan and demonstrated an understanding of the instructions.   The patient was advised to call back or seek an in-person evaluation if any new concerns, if symptoms worsen or if the condition fails  to improve as anticipated.  30 minutes of non-face-to-face time was provided during this encounter.      . . . . . . . . . . . . . Marland Kitchen                   Historical information moved to improve visibility of documentation.  Past Medical History:  Diagnosis Date  . Asthma   . GERD (gastroesophageal reflux disease)   . Hypertension    only to combat the effects of hctz   Past Surgical History:  Procedure Laterality Date  . APPENDECTOMY    . DILATION AND CURETTAGE OF UTERUS  12/2011  . TONSILLECTOMY  2003  . TOOTH EXTRACTION     Social History   Tobacco Use   . Smoking status: Never Smoker  . Smokeless tobacco: Never Used  Substance Use Topics  . Alcohol use: No    Alcohol/week: 0.0 standard drinks   family history includes Cervical cancer (age of onset: 15) in her mother; Diabetes in her maternal grandfather; Food Allergy in her mother; Hypertension in her mother.  Medications: Current Outpatient Medications  Medication Sig Dispense Refill  . albuterol (PROVENTIL) (2.5 MG/3ML) 0.083% nebulizer solution TAKE 3 MLS (2.5 MG TOTAL) BY NEBULIZATION EVERY 6 (SIX) HOURS AS NEEDED FOR WHEEZING OR SHORTNESS OF BREATH. 150 mL 1  . albuterol (VENTOLIN HFA) 108 (90 Base) MCG/ACT inhaler Inhale 2 puffs into the lungs every 6 (six) hours as needed for wheezing or shortness of breath. 18 g 0  . ALPRAZolam (XANAX) 0.5 MG tablet Take 0.5-1 tablets (0.25-0.5 mg total) by mouth daily as needed for anxiety (panic attack). 15 tablet 1  . AMBULATORY NON FORMULARY MEDICATION Nebulizer with necessary accessories.  Dx: Moderate Persistent Asthma 1 Units 0  . budesonide (RHINOCORT AQUA) 32 MCG/ACT nasal spray Place 2 sprays into both nostrils daily. 8.6 g 11  . cetirizine HCl (ZYRTEC CHILDRENS ALLERGY) 5 MG/5ML SOLN Take 2.5 mg by mouth daily. Patient takes  1/4 teaspoon at bedtime.    Marland Kitchen EPINEPHrine (EPIPEN 2-PAK) 0.3 mg/0.3 mL IJ SOAJ injection Inject 0.3 mLs (0.3 mg total) into the muscle as needed for anaphylaxis. 1 each 0  . escitalopram (LEXAPRO) 10 MG tablet Take 1 tablet (10 mg total) by mouth daily. 30 tablet 3  . Fluticasone-Salmeterol (ADVAIR DISKUS) 500-50 MCG/DOSE AEPB Inhale 1 puff into the lungs 2 (two) times daily. 60 each 5  . hydrochlorothiazide (HYDRODIURIL) 25 MG tablet Take 1 tablet (25 mg total) by mouth daily. 90 tablet 3  . ibuprofen (ADVIL,MOTRIN) 600 MG tablet 2 (two) times daily as needed.  0  . loratadine (CLARITIN) 10 MG tablet Take 1 tablet (10 mg total) by mouth daily. 90 tablet 1  . montelukast (SINGULAIR) 10 MG tablet TAKE 1 TABLET BY  MOUTH EVERYDAY AT BEDTIME 90 tablet 3  . Prenatal Vit-Fe Fumarate-FA (PRENATAL MULTIVITAMIN) TABS tablet Take 1 tablet by mouth daily at 12 noon.    Marland Kitchen Spacer/Aero-Holding Chambers (AEROCHAMBER PLUS) inhaler Use as instructed 1 each 2  . vitamin C (ASCORBIC ACID) 500 MG tablet Take 500 mg by mouth daily.    . predniSONE (DELTASONE) 20 MG tablet Take 1 tablet (20 mg total) by mouth 2 (two) times daily with a meal. 10 tablet 2   No current facility-administered medications for this visit.   Allergies  Allergen Reactions  . Compazine [Prochlorperazine Edisylate] Other (See Comments)    seizure  . Corn-Containing Products Hives, Diarrhea and Other (See  Comments)  . Peanut-Containing Drug Products Anaphylaxis  . Ppd [Tuberculin Purified Protein Derivative] Hives and Swelling    Patient stated,"can't receive the injection anymore per MD order. Must get the xray."  . Sesame Oil Hives  . Soybean Oil Anaphylaxis  . Eggs Or Egg-Derived Products Nausea And Vomiting  . Other     Tree nuts, melon, gluten intolerance  . Shellfish Allergy Swelling  . Latex Hives and Rash

## 2020-02-20 ENCOUNTER — Encounter: Payer: Self-pay | Admitting: Osteopathic Medicine

## 2020-02-21 ENCOUNTER — Encounter: Payer: Self-pay | Admitting: Osteopathic Medicine

## 2020-03-03 ENCOUNTER — Other Ambulatory Visit: Payer: Self-pay | Admitting: Family Medicine

## 2020-03-03 DIAGNOSIS — J4550 Severe persistent asthma, uncomplicated: Secondary | ICD-10-CM

## 2020-03-06 NOTE — Telephone Encounter (Signed)
NA-Plz see refill req/thx dmf 

## 2020-03-27 DIAGNOSIS — N6002 Solitary cyst of left breast: Secondary | ICD-10-CM | POA: Diagnosis not present

## 2020-03-27 DIAGNOSIS — N6342 Unspecified lump in left breast, subareolar: Secondary | ICD-10-CM | POA: Diagnosis not present

## 2020-03-27 DIAGNOSIS — R921 Mammographic calcification found on diagnostic imaging of breast: Secondary | ICD-10-CM | POA: Diagnosis not present

## 2020-03-30 DIAGNOSIS — Z124 Encounter for screening for malignant neoplasm of cervix: Secondary | ICD-10-CM | POA: Diagnosis not present

## 2020-03-30 DIAGNOSIS — Z1151 Encounter for screening for human papillomavirus (HPV): Secondary | ICD-10-CM | POA: Diagnosis not present

## 2020-04-10 ENCOUNTER — Encounter: Payer: Self-pay | Admitting: Osteopathic Medicine

## 2020-04-18 ENCOUNTER — Ambulatory Visit (INDEPENDENT_AMBULATORY_CARE_PROVIDER_SITE_OTHER): Payer: Federal, State, Local not specified - PPO | Admitting: Physician Assistant

## 2020-04-18 ENCOUNTER — Encounter: Payer: Self-pay | Admitting: Physician Assistant

## 2020-04-18 VITALS — BP 110/58 | HR 80 | Ht 66.0 in | Wt 151.0 lb

## 2020-04-18 DIAGNOSIS — J4540 Moderate persistent asthma, uncomplicated: Secondary | ICD-10-CM

## 2020-04-18 DIAGNOSIS — D508 Other iron deficiency anemias: Secondary | ICD-10-CM

## 2020-04-18 DIAGNOSIS — R635 Abnormal weight gain: Secondary | ICD-10-CM | POA: Diagnosis not present

## 2020-04-18 DIAGNOSIS — L659 Nonscarring hair loss, unspecified: Secondary | ICD-10-CM

## 2020-04-18 DIAGNOSIS — F419 Anxiety disorder, unspecified: Secondary | ICD-10-CM | POA: Diagnosis not present

## 2020-04-18 NOTE — Patient Instructions (Signed)
Will make referral for pulmonology.

## 2020-04-18 NOTE — Progress Notes (Signed)
Subjective:    Patient ID: Toni Harmon, female    DOB: 04/22/1976, 44 y.o.   MRN: [id number]  HPI  Patient is a 44 year old female with moderate to persistent asthma with frequent exacerbations and severe allergies who was previously been written out of work in the Covid pandemic due to high risk of complications and not being able to provide a safe environment to do mask and nebulizers if needed.  Her work is now pushing her to go back to work.  Even at home she continues to have about one exacerbation needing prednisone on month.  She still does not feel comfortable about getting the vaccine due to risk of reaction.  Her work does not require mask and this makes her uncomfortable.  She did fill like she can control her symptoms when she was able to nebulize at work.  This concerns her about not having a safe environment.  She has never seen a pulmonologist.  She is also concerned with recent weight gain, fatigue, hair loss.  .. Active Ambulatory Problems    Diagnosis Date Noted  . Essential hypertension 11/16/2012  . Moderate persistent asthma 11/16/2012  . Degeneration of cervical intervertebral disc 11/16/2012  . Allergic rhinitis 11/16/2012  . IBS (irritable bowel syndrome) 11/16/2012  . Anxiety and depression 11/16/2012  . Vitamin D deficiency 09/02/2016  . Iron deficiency anemia 09/02/2016  . Food allergy, peanut 08/18/2012  . 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 Family history of cervical cancer 12/25/2017  . HLD (hyperlipidemia) 12/28/2017  . Lung nodule < 6cm on CT 08/12/2018  . Abnormal findings in stool 02/05/2020   Resolved Ambulatory Problems    Diagnosis Date Noted  . Allergic rhinitis 11/14/2015  . Onychomycosis 03/19/2016  . Food allergy 01/21/2017  . Ingrown toenail 06/30/2017   Past Medical History:  Diagnosis Date  . Asthma   . GERD (gastroesophageal reflux disease)   . Hypertension     Review of Systems  See HPI.       Objective:   Physical Exam Vitals reviewed.  Constitutional:      Appearance: Normal appearance.  Cardiovascular:     Rate and Rhythm: Normal rate and regular rhythm.     Pulses: Normal pulses.  Pulmonary:     Effort: Pulmonary effort is normal.     Breath sounds: Normal breath sounds.  Neurological:     General: No focal deficit present.     Mental Status: She is alert and oriented to person, place, and time.  Psychiatric:        Mood and Affect: Mood normal.           Assessment & Plan:  Marland KitchenMarland KitchenLannette was seen today for asthma.  Diagnoses and all orders for this visit:  Moderate persistent asthma without complication -     TSH -     COMPLETE METABOLIC PANEL WITH GFR -     Ambulatory referral to Pulmonology  Weight gain -     TSH -     COMPLETE METABOLIC PANEL WITH GFR -     CBC -     Fe+TIBC+Fer -     T3, free -     T4  Hair loss -     TSH -     COMPLETE METABOLIC PANEL WITH GFR -     CBC -     Fe+TIBC+Fer -     T3, free -     T4  Anxiety -  TSH -     COMPLETE METABOLIC PANEL WITH GFR -     CBC -     Fe+TIBC+Fer  Iron deficiency anemia secondary to inadequate dietary iron intake -     TSH -     CBC -     Fe+TIBC+Fer   Encourage Covid vaccine today.  I understand there is some hesitation but I think the benefits outweigh the risks.  We could monitor her very closely for any allergic reaction.  This would provide her some protection at work.  I did go ahead and release her back to work with the recommendation that they provide a safe place for her to do mask and nebulizers as needed.  Strongly encouraged her to wear her mask.  Patient did have some concerns for fatigue, weight gain, hair loss.  I did order some labs to evaluate this.  Certainly she has been using a lot of prednisone and discussed this could be some side effects of prednisone.  At this time she is on maximum therapy for her asthma and still having frequent exacerbations.  I am  sending to pulmonology for further work up.   Spent 30 minutes with patient.

## 2020-04-19 LAB — T3, FREE: T3, Free: 3.5 pg/mL (ref 2.3–4.2)

## 2020-04-19 LAB — COMPLETE METABOLIC PANEL WITH GFR
AG Ratio: 2.1 (calc) (ref 1.0–2.5)
ALT: 21 U/L (ref 6–29)
AST: 18 U/L (ref 10–30)
Albumin: 4.6 g/dL (ref 3.6–5.1)
Alkaline phosphatase (APISO): 44 U/L (ref 31–125)
BUN: 14 mg/dL (ref 7–25)
CO2: 32 mmol/L (ref 20–32)
Calcium: 10.3 mg/dL — ABNORMAL HIGH (ref 8.6–10.2)
Chloride: 99 mmol/L (ref 98–110)
Creat: 0.69 mg/dL (ref 0.50–1.10)
GFR, Est African American: 124 mL/min/{1.73_m2} (ref >=60)
GFR, Est Non African American: 107 mL/min/{1.73_m2} (ref >=60)
Globulin: 2.2 g/dL (calc) (ref 1.9–3.7)
Glucose, Bld: 78 mg/dL (ref 65–99)
Potassium: 3.5 mmol/L (ref 3.5–5.3)
Sodium: 139 mmol/L (ref 135–146)
Total Bilirubin: 0.3 mg/dL (ref 0.2–1.2)
Total Protein: 6.8 g/dL (ref 6.1–8.1)

## 2020-04-19 LAB — IRON,TIBC AND FERRITIN PANEL
%SAT: 20 % (calc) (ref 16–45)
Ferritin: 118 ng/mL (ref 16–232)
Iron: 62 ug/dL (ref 40–190)
TIBC: 315 mcg/dL (calc) (ref 250–450)

## 2020-04-19 LAB — CBC
HCT: 42.1 % (ref 35.0–45.0)
Hemoglobin: 13.8 g/dL (ref 11.7–15.5)
MCH: 29.2 pg (ref 27.0–33.0)
MCHC: 32.8 g/dL (ref 32.0–36.0)
MCV: 89.2 fL (ref 80.0–100.0)
MPV: 8.5 fL (ref 7.5–12.5)
Platelets: 383 10*3/uL (ref 140–400)
RBC: 4.72 10*6/uL (ref 3.80–5.10)
RDW: 12.4 % (ref 11.0–15.0)
WBC: 4.4 10*3/uL (ref 3.8–10.8)

## 2020-04-19 LAB — TSH: TSH: 3.1 mIU/L

## 2020-04-19 LAB — T4: T4, Total: 9.2 ug/dL (ref 5.1–11.9)

## 2020-04-20 ENCOUNTER — Encounter: Payer: Self-pay | Admitting: Physician Assistant

## 2020-04-20 ENCOUNTER — Other Ambulatory Visit: Payer: Self-pay

## 2020-04-20 DIAGNOSIS — R899 Unspecified abnormal finding in specimens from other organs, systems and tissues: Secondary | ICD-10-CM

## 2020-04-20 NOTE — Progress Notes (Signed)
Thyroid looks really good.  Iron looks great.  No anemia.  Calcium just a hair elevated. Please add PTH or have her come back to have redrawn in 1-2 weeks.

## 2020-04-25 ENCOUNTER — Encounter: Payer: Self-pay | Admitting: Physician Assistant

## 2020-04-25 ENCOUNTER — Ambulatory Visit (INDEPENDENT_AMBULATORY_CARE_PROVIDER_SITE_OTHER): Payer: Federal, State, Local not specified - PPO | Admitting: Physician Assistant

## 2020-04-25 ENCOUNTER — Encounter: Payer: Self-pay | Admitting: Osteopathic Medicine

## 2020-04-25 ENCOUNTER — Other Ambulatory Visit: Payer: Self-pay

## 2020-04-25 DIAGNOSIS — R899 Unspecified abnormal finding in specimens from other organs, systems and tissues: Secondary | ICD-10-CM | POA: Diagnosis not present

## 2020-04-25 DIAGNOSIS — J4550 Severe persistent asthma, uncomplicated: Secondary | ICD-10-CM | POA: Diagnosis not present

## 2020-04-25 MED ORDER — PREDNISONE 20 MG PO TABS: ORAL_TABLET | ORAL | 0 refills | Status: AC

## 2020-04-25 NOTE — Progress Notes (Signed)
Subjective:    Patient ID: Toni Harmon, female    DOB: 03-31-1976, 44 y.o.   MRN: [id number]  HPI  Pt is a 44 yo female with chronic severe persistent asthma who presents to the clinic for follow up. 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 Her employer is now giving her problems with honoring the note and she is working with a Furniture conservator/restorer. Pt is having asthma concerns even not working and her work environment increases the chances of exacerbation as well of risk of contracting covid with them not having a mask mandate. She is also concerned about a safe place to de-mask and nebulizes if needed.   Her current symptoms are more wheezing, runny nose, watery eye discharge, SOB. She is currently not on prednisone. She is on her allergy regimen and using rescue inhaler.   .. Active Ambulatory Problems    Diagnosis Date Noted  . Essential hypertension 11/16/2012  . Moderate persistent asthma 11/16/2012  . Degeneration of cervical intervertebral disc 11/16/2012  . Allergic rhinitis 11/16/2012  . IBS (irritable bowel syndrome) 11/16/2012  . Anxiety and depression 11/16/2012  . Vitamin D deficiency 09/02/2016  . Iron deficiency anemia 09/02/2016  . Food allergy, peanut 08/18/2012  . 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 Family history of cervical cancer 12/25/2017  . HLD (hyperlipidemia) 12/28/2017  . Lung nodule < 6cm on CT 08/12/2018  . Abnormal findings in stool 02/05/2020  . Asthma, chronic, severe persistent, uncomplicated 04/25/2020   Resolved Ambulatory Problems    Diagnosis Date Noted  . Allergic rhinitis 11/14/2015  . Onychomycosis 03/19/2016  . Food allergy 01/21/2017  . Ingrown toenail 06/30/2017   Past Medical History:  Diagnosis Date  . Asthma   . GERD (gastroesophageal reflux disease)   . Hypertension      Review of Systems  All other systems reviewed and are  negative.      Objective:   Physical Exam Vitals reviewed.  Constitutional:      Appearance: Normal appearance.  HENT:     Head: Normocephalic.     Right Ear: Tympanic membrane normal.     Left Ear: Tympanic membrane normal.     Nose: Nose normal.     Mouth/Throat:     Mouth: Mucous membranes are moist.  Eyes:     Conjunctiva/sclera: Conjunctivae normal.     Pupils: Pupils are equal, round, and reactive to light.     Comments: Watery discharge  Cardiovascular:     Rate and Rhythm: Normal rate and regular rhythm.     Pulses: Normal pulses.  Pulmonary:     Effort: Pulmonary effort is normal.     Breath sounds: Normal breath sounds.  Lymphadenopathy:     Cervical: No cervical adenopathy.  Neurological:     General: No focal deficit present.     Mental Status: She is alert and oriented to person, place, and time.  Psychiatric:        Mood and Affect: Mood normal.           Assessment & Plan:  Marland KitchenMarland KitchenLannette was seen today for asthma.  Diagnoses and all orders for this visit:  Asthma, chronic, severe persistent, uncomplicated -     predniSONE (DELTASONE) 20 MG tablet; Three tabs daily days 1-3, two tabs daily days 4-6, one tab daily days 7-9, half tab daily days 10-13.   Vitals great today. No significant findings on PE. Pt  requested prednisone for her asthma symptoms. Prednisone taper given.   She needs note from PCP to return to her work to be allowed to not return. I will see if Dr. Lyn Hollingshead will fill this out.   Needs PTH checked due to elevated calcium levels will do today.

## 2020-04-26 LAB — PTH, INTACT AND CALCIUM
Calcium: 9.9 mg/dL (ref 8.6–10.2)
PTH: 15 pg/mL (ref 14–64)

## 2020-04-27 NOTE — Progress Notes (Signed)
Your calcium and PTH are both in normal range.

## 2020-04-30 ENCOUNTER — Other Ambulatory Visit: Payer: Self-pay

## 2020-04-30 ENCOUNTER — Ambulatory Visit (INDEPENDENT_AMBULATORY_CARE_PROVIDER_SITE_OTHER): Payer: Federal, State, Local not specified - PPO | Admitting: Osteopathic Medicine

## 2020-04-30 VITALS — BP 104/74 | HR 97 | Wt 146.0 lb

## 2020-04-30 DIAGNOSIS — J4550 Severe persistent asthma, uncomplicated: Secondary | ICD-10-CM | POA: Diagnosis not present

## 2020-04-30 NOTE — Progress Notes (Signed)
Toni Harmon is a 44 y.o. female who presents to  [facility] at [facility]  today, 04/30/20, seeking care for the following:  . 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      ASSESSMENT & PLAN with other pertinent findings:  The encounter diagnosis was Asthma, chronic, severe persistent, uncomplicated.   Letter generated I advised her speak with a lawyer re: paid leave or other options, not sure what her legal standing is on continue leave that's not really mandated quarantine.  Advised she has no medical reason not to get vaccinated, and recommended vaccination with close monitoring given her concerns for allergies/anaphylaxis.   No results found for this or any previous visit (from the past 24 hour(s)).     No orders of the defined types were placed in this encounter.   No orders of the defined types were placed in this encounter.      Follow-up instructions: Return for recheck as needed .                                         BP 104/74 (BP Location: Left Arm, Patient Position: Sitting)   Pulse 97   Wt 146 lb (66.2 kg)   SpO2 100%   BMI 23.57 kg/m   Current Meds  Medication Sig  . albuterol (PROVENTIL) (2.5 MG/3ML) 0.083% nebulizer solution TAKE 3 MLS (2.5 MG TOTAL) BY NEBULIZATION EVERY 6 (SIX) HOURS AS NEEDED FOR WHEEZING OR SHORTNESS OF BREATH.  Marland Kitchen albuterol (VENTOLIN HFA) 108 (90 Base) MCG/ACT inhaler Inhale 2 puffs into the lungs every 6 (six) hours as needed for wheezing or shortness of breath.  . ALPRAZolam (XANAX) 0.5 MG tablet Take 0.5-1 tablets (0.25-0.5 mg total) by mouth daily as needed for anxiety (panic attack).  . AMBULATORY NON FORMULARY MEDICATION Nebulizer with necessary  accessories.  Dx: Moderate Persistent Asthma  . budesonide (RHINOCORT AQUA) 32 MCG/ACT nasal spray Place 2 sprays into both nostrils daily.  . cetirizine HCl (ZYRTEC CHILDRENS ALLERGY) 5 MG/5ML SOLN Take 2.5 mg by mouth daily. Patient takes  1/4 teaspoon at bedtime.  Marland Kitchen escitalopram (LEXAPRO) 10 MG tablet Take 1 tablet (10 mg total) by mouth daily.  . Fluticasone-Salmeterol (ADVAIR DISKUS) 500-50 MCG/DOSE AEPB Inhale 1 puff into the lungs 2 (two) times daily.  . hydrochlorothiazide (HYDRODIURIL) 25 MG tablet Take 1 tablet (25 mg total) by mouth daily.  Marland Kitchen ibuprofen (ADVIL,MOTRIN) 600 MG tablet 2 (two) times daily as needed.  . loratadine (CLARITIN) 10 MG tablet Take 1 tablet (10 mg total) by mouth daily.  . montelukast (SINGULAIR) 10 MG tablet TAKE 1 TABLET BY MOUTH EVERYDAY AT BEDTIME  . predniSONE (DELTASONE) 20 MG tablet Three tabs daily days 1-3, two tabs daily days 4-6, one tab daily days 7-9, half tab daily days 10-13.  . Prenatal Vit-Fe Fumarate-FA (PRENATAL MULTIVITAMIN) TABS tablet Take 1 tablet by mouth daily at 12 noon.  Marland Kitchen Spacer/Aero-Holding Chambers (AEROCHAMBER PLUS) inhaler Use as instructed  . vitamin C (ASCORBIC ACID) 500 MG tablet Take 500 mg by mouth daily.    No results found for this or any previous visit (from the past 72 hour(s)).  No  results found.     All questions at time of visit were answered - patient instructed to contact office with any additional concerns or updates.  ER/RTC precautions were reviewed with the patient as applicable.   Please note: voice recognition software was used to produce this document, and typos may escape review. Please contact Dr. Lyn Hollingshead for any needed clarifications.   Total encounter time: 15 minutes.

## 2020-05-15 ENCOUNTER — Encounter: Payer: Self-pay | Admitting: Osteopathic Medicine

## 2020-05-17 ENCOUNTER — Encounter: Payer: Self-pay | Admitting: Osteopathic Medicine

## 2020-05-21 NOTE — Telephone Encounter (Signed)
To pcp

## 2020-05-24 ENCOUNTER — Ambulatory Visit (INDEPENDENT_AMBULATORY_CARE_PROVIDER_SITE_OTHER): Payer: Federal, State, Local not specified - PPO | Admitting: Osteopathic Medicine

## 2020-05-24 ENCOUNTER — Encounter: Payer: Self-pay | Admitting: Osteopathic Medicine

## 2020-05-24 DIAGNOSIS — J4540 Moderate persistent asthma, uncomplicated: Secondary | ICD-10-CM | POA: Diagnosis not present

## 2020-05-24 DIAGNOSIS — Z7189 Other specified counseling: Secondary | ICD-10-CM

## 2020-05-24 DIAGNOSIS — Z7185 Encounter for immunization safety counseling: Secondary | ICD-10-CM

## 2020-05-24 NOTE — Progress Notes (Signed)
Toni Harmon is a 44 y.o. female who presents to  [facility] at [facility]  today, 05/24/20, seeking care for the following:  . Needs letter for work . Questions about COVID vaccine - she's been nervous to get this vaccine but is now feeling that risks of COVID infection > risks of vaccine and hoping that vaccination will ease her anxiety      ASSESSMENT & PLAN with other pertinent findings:  The primary encounter diagnosis was Moderate persistent asthma, unspecified whether complicated. A diagnosis of Vaccine counseling was also pertinent to this visit.   Dicussed what to expect w/ COVID vaccination, anaphylaxis / severe reaction precautions vs expected immune reaction symptoms   Letter printed for return to work 05/28/2020 Monday    Follow-up instructions: Return for recheck as needed.                                         There were no vitals taken for this visit.  Current Meds  Medication Sig  . albuterol (PROVENTIL) (2.5 MG/3ML) 0.083% nebulizer solution TAKE 3 MLS (2.5 MG TOTAL) BY NEBULIZATION EVERY 6 (SIX) HOURS AS NEEDED FOR WHEEZING OR SHORTNESS OF BREATH.  Marland Kitchen albuterol (VENTOLIN HFA) 108 (90 Base) MCG/ACT inhaler Inhale 2 puffs into the lungs every 6 (six) hours as needed for wheezing or shortness of breath.  . ALPRAZolam (XANAX) 0.5 MG tablet Take 0.5-1 tablets (0.25-0.5 mg total) by mouth daily as needed for anxiety (panic attack).  . AMBULATORY NON FORMULARY MEDICATION Nebulizer with necessary accessories.  Dx: Moderate Persistent Asthma  . budesonide (RHINOCORT AQUA) 32 MCG/ACT nasal spray Place 2 sprays into both nostrils daily.  . cetirizine HCl (ZYRTEC CHILDRENS ALLERGY) 5 MG/5ML SOLN Take 2.5 mg by mouth daily. Patient takes  1/4 teaspoon at bedtime.  Marland Kitchen EPINEPHrine (EPIPEN 2-PAK) 0.3 mg/0.3 mL IJ SOAJ injection Inject 0.3 mLs (0.3 mg total) into the muscle as needed for  anaphylaxis.  Marland Kitchen escitalopram (LEXAPRO) 10 MG tablet Take 1 tablet (10 mg total) by mouth daily.  . Fluticasone-Salmeterol (ADVAIR DISKUS) 500-50 MCG/DOSE AEPB Inhale 1 puff into the lungs 2 (two) times daily.  . hydrochlorothiazide (HYDRODIURIL) 25 MG tablet Take 1 tablet (25 mg total) by mouth daily.  Marland Kitchen ibuprofen (ADVIL,MOTRIN) 600 MG tablet 2 (two) times daily as needed.  . loratadine (CLARITIN) 10 MG tablet Take 1 tablet (10 mg total) by mouth daily.  . montelukast (SINGULAIR) 10 MG tablet TAKE 1 TABLET BY MOUTH EVERYDAY AT BEDTIME  . Prenatal Vit-Fe Fumarate-FA (PRENATAL MULTIVITAMIN) TABS tablet Take 1 tablet by mouth daily at 12 noon.  Marland Kitchen Spacer/Aero-Holding Chambers (AEROCHAMBER PLUS) inhaler Use as instructed  . vitamin C (ASCORBIC ACID) 500 MG tablet Take 500 mg by mouth daily.    No results found for this or any previous visit (from the past 72 hour(s)).  No results found.     All questions at time of visit were answered - patient instructed to contact office with any additional concerns or updates.  ER/RTC precautions were reviewed with the patient as applicable.   Please note: voice recognition software was used to produce this document, and typos may escape review. Please contact Dr. Lyn Hollingshead for any needed clarifications.   Total encounter time: 15  minutes.

## 2020-05-27 ENCOUNTER — Other Ambulatory Visit: Payer: Self-pay | Admitting: Family Medicine

## 2020-05-31 ENCOUNTER — Other Ambulatory Visit: Payer: Self-pay | Admitting: Family Medicine

## 2020-06-07 ENCOUNTER — Other Ambulatory Visit: Payer: Self-pay | Admitting: Family Medicine

## 2020-06-28 ENCOUNTER — Other Ambulatory Visit: Payer: Self-pay | Admitting: Physician Assistant

## 2020-06-28 DIAGNOSIS — J4550 Severe persistent asthma, uncomplicated: Secondary | ICD-10-CM

## 2020-07-18 ENCOUNTER — Emergency Department
Admission: RE | Admit: 2020-07-18 | Discharge: 2020-07-18 | Disposition: A | Discharge status: Home / Self Care | Payer: Federal, State, Local not specified - PPO | Source: Ambulatory Visit | Attending: Family Medicine | Admitting: Family Medicine

## 2020-07-18 ENCOUNTER — Other Ambulatory Visit: Payer: Self-pay

## 2020-07-18 VITALS — BP 112/75 | HR 74 | Temp 98.5°F | Resp 16 | Wt 151.9 lb

## 2020-07-18 DIAGNOSIS — T7840XA Allergy, unspecified, initial encounter: Secondary | ICD-10-CM | POA: Diagnosis not present

## 2020-07-18 LAB — POCT CBC W AUTO DIFF (K'VILLE URGENT CARE)

## 2020-07-18 LAB — POCT URINALYSIS DIP (MANUAL ENTRY)
Bilirubin, UA: NEGATIVE
Blood, UA: NEGATIVE
Glucose, UA: NEGATIVE mg/dL
Ketones, POC UA: NEGATIVE mg/dL
Leukocytes, UA: NEGATIVE
Nitrite, UA: NEGATIVE
Protein Ur, POC: NEGATIVE mg/dL
Spec Grav, UA: 1.025 (ref 1.010–1.025)
Urobilinogen, UA: 0.2 E.U./dL
pH, UA: 5.5 (ref 5.0–8.0)

## 2020-07-18 MED ORDER — PREDNISONE 20 MG PO TABS: ORAL_TABLET | ORAL | 0 refills | Status: DC

## 2020-07-18 MED ORDER — METHYLPREDNISOLONE SODIUM SUCC 125 MG IJ SOLR
80.0000 mg | Freq: Once | INTRAMUSCULAR | Status: AC
  Administered 2020-07-18: 80 mg via INTRAMUSCULAR

## 2020-07-18 NOTE — ED Triage Notes (Signed)
Patient presents to Urgent Care with complaints of allergic reaction since about 5 days ago. Patient reports she ate corn and something that contained gluten (is allergic to both). Pt states she has been using her inhaler more that usual and has had swelling in her ankles and stomach. Ankles visibly swollen. Pt did not use Epi pen at any point, has not taken benadryl in the past 24 hours.  Pt states she takes hydrochlorothiazide and knows it is a diuretic, but she feels like none of the fluid she has been drinking is coming back out.

## 2020-07-18 NOTE — ED Provider Notes (Signed)
[facility]    CSN: [id number] Arrival date & time: 07/18/20  1032      History   Chief Complaint Chief Complaint  Patient presents with  . Appointment    12:00  . Allergic Reaction    HPI Toni Harmon is a 44 y.o. female.   Patient reports that she developed a mild systemic allergic reaction about five days ago after eating a product that contained both corn and gluten.  She has a history of allergies to multiple foods, having been followed by an allergist in the past.  She developed mild itching in her lips without swelling, mild sore throat without difficulty swallowing, and itching in her scalp without rash.  She has asthma and has had mild tightness in her chest that improves with her albuterol inhaler.  She has also had swelling in her ankles and lower legs without pain.  Her symptoms also improve somewhat with Benadryl but it causes excessive drowsiness. She tried taking HCTZ for the swelling in her legs which has not been helpful.  The history is provided by the patient.    Past Medical History:  Diagnosis Date  . Asthma   . GERD (gastroesophageal reflux disease)   . Hypertension    only to combat the effects of hctz    Patient Active Problem List   Diagnosis Date Noted  . Asthma, chronic, severe persistent, uncomplicated 04/25/2020  . Abnormal findings in stool 02/05/2020  . Lung nodule < 6cm on CT 08/12/2018  . HLD (hyperlipidemia) 12/28/2017  . Family history of cervical cancer 12/25/2017  . Vitamin D deficiency 09/02/2016  . Iron deficiency anemia 09/02/2016  . Essential hypertension 11/16/2012  . Moderate persistent asthma 11/16/2012  . Degeneration of cervical intervertebral disc 11/16/2012  . Allergic rhinitis 11/16/2012  . IBS (irritable bowel syndrome) 11/16/2012  . Anxiety and depression 11/16/2012  . Food allergy, peanut 08/18/2012  . 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 APPENDECTOMY    . DILATION AND CURETTAGE OF UTERUS  12/2011  . TONSILLECTOMY  2003  . TOOTH EXTRACTION      OB History    Gravida  5   Para      Term      Preterm      AB  4   Living  0     SAB  3   TAB  1   Ectopic      Multiple      Live Births               Home Medications    Prior to Admission medications   Medication Sig Start Date End Date Taking? Authorizing Provider  hydrochlorothiazide (HYDRODIURIL) 25 MG tablet Take 1 tablet (25 mg total) by mouth daily. 12/23/19  Yes Everrett CoombeMatthews, Cody, DO  albuterol (PROVENTIL) (2.5 MG/3ML) 0.083% nebulizer solution TAKE 3 MLS (2.5 MG TOTAL) BY NEBULIZATION EVERY 6 (SIX) HOURS AS NEEDED FOR WHEEZING OR SHORTNESS OF BREATH. 03/07/20   Sunnie NielsenAlexander, Natalie, DO  albuterol (VENTOLIN HFA) 108 (90 Base) MCG/ACT inhaler TAKE 2 PUFFS BY MOUTH EVERY 6 HOURS AS NEEDED FOR WHEEZE OR SHORTNESS OF BREATH 06/07/20   Everrett CoombeMatthews, Cody, DO  ALPRAZolam Prudy Feeler(XANAX) 0.5 MG tablet Take 0.5-1 tablets (0.25-0.5 mg total) by mouth daily as needed for anxiety (panic attack). 01/16/20   Everrett CoombeMatthews, Cody, DO  AMBULATORY NON FORMULARY MEDICATION Nebulizer with necessary accessories.  Dx: Moderate Persistent Asthma 10/26/13  Hommel, Sean, DO  budesonide (RHINOCORT AQUA) 32 MCG/ACT nasal spray Place 2 sprays into both nostrils daily. 04/25/19   [name], DO  cetirizine HCl (ZYRTEC CHILDRENS ALLERGY) 5 MG/5ML SOLN Take 2.5 mg by mouth daily. Patient takes  1/4 teaspoon at bedtime.    [provider]  EPINEPHrine (EPIPEN 2-PAK) 0.3 mg/0.3 mL IJ SOAJ injection Inject 0.3 mLs (0.3 mg total) into the muscle as needed for anaphylaxis. 02/17/20   [name], DO  escitalopram (LEXAPRO) 10 MG tablet TAKE 1 TABLET BY MOUTH EVERY DAY 05/31/20   [name], DO  ibuprofen (ADVIL,MOTRIN) 600 MG tablet 2 (two) times daily as needed. 12/10/16   [provider]  loratadine (CLARITIN) 10 MG tablet Take 1 tablet (10 mg total) by  mouth daily. 11/08/15   Hommel, [name], DO  montelukast (SINGULAIR) 10 MG tablet TAKE 1 TABLET BY MOUTH EVERYDAY AT BEDTIME 05/28/20   [name], DO  predniSONE (DELTASONE) 20 MG tablet Take one tab by mouth twice daily for 4 days, then one daily for 3 days. Take with food. 07/18/20   [name], MD  Prenatal Vit-Fe Fumarate-FA (PRENATAL MULTIVITAMIN) TABS tablet Take 1 tablet by mouth daily at 12 noon.    [provider]  Spacer/Aero-Holding Chambers (AEROCHAMBER PLUS) inhaler Use as instructed 01/16/20   [name], DO  vitamin C (ASCORBIC ACID) 500 MG tablet Take 500 mg by mouth daily.    [provider]  Monte Fantasia INHUB 500-50 MCG/DOSE AEPB TAKE 1 PUFF BY MOUTH TWICE A DAY 06/28/20   [name], DO  albuterol (PROVENTIL) (2.5 MG/3ML) 0.083% nebulizer solution Take 3 mLs (2.5 mg total) by nebulization every 6 (six) hours as needed for wheezing or shortness of breath. 11/07/19   [name], DO    Family History Family History  Problem Relation Age of Onset  . Cervical cancer Mother 7  . Hypertension Mother   . Food Allergy Mother   . Diabetes Maternal Grandfather     Social History Social History   Tobacco Use  . Smoking status: Never Smoker  . Smokeless tobacco: Never Used  Vaping Use  . Vaping Use: Never used  Substance Use Topics  . Alcohol use: No    Alcohol/week: 0.0 standard drinks  . Drug use: No     Allergies   Compazine [prochlorperazine edisylate], Corn-containing products, Peanut-containing drug products, Ppd [tuberculin purified protein derivative], Sesame oil, Soybean oil, Eggs or egg-derived products, Other, Shellfish allergy, and Latex   Review of Systems Review of Systems  Constitutional: Negative for activity change, appetite change, chills, diaphoresis, fatigue and fever.  HENT: Positive for sore throat. Negative for congestion, facial swelling and trouble swallowing.        Itching in lips  Eyes: Negative.     Respiratory: Positive for chest tightness. Negative for cough, shortness of breath, wheezing and stridor.   Cardiovascular: Negative.   Gastrointestinal: Negative.   Genitourinary: Negative.   Musculoskeletal: Positive for joint swelling.  Skin: Negative for rash.  Allergic/Immunologic: Positive for food allergies.     Physical Exam Triage Vital Signs ED Triage Vitals  Enc Vitals Group     BP 07/18/20 1046 112/75     Pulse Rate 07/18/20 1046 74     Resp 07/18/20 1046 16     Temp 07/18/20 1046 98.5 F (36.9 C)     Temp Source 07/18/20 1046 Oral     SpO2 07/18/20 1046 100 %     Weight 07/18/20 1048 151  lb 14.4 oz (68.9 kg)     Height --      Head Circumference --      Peak Flow --      Pain Score 07/18/20 1044 0     Pain Loc --      Pain Edu? --      Excl. in GC? --    No data found.  Updated Vital Signs BP 112/75 (BP Location: Right Arm)   Pulse 74   Temp 98.5 F (36.9 C) (Oral)   Resp 16   Wt 68.9 kg   SpO2 100%   BMI 24.52 kg/m   Visual Acuity Right Eye Distance:   Left Eye Distance:   Bilateral Distance:    Right Eye Near:   Left Eye Near:    Bilateral Near:     Physical Exam Vitals and nursing note reviewed.  Constitutional:      General: She is not in acute distress.    Appearance: She is not ill-appearing.  HENT:     Head: Normocephalic.     Right Ear: External ear normal.     Left Ear: External ear normal.     Nose: Nose normal.     Mouth/Throat:     Pharynx: Oropharynx is clear.     Comments: No swelling of lips Eyes:     Extraocular Movements: Extraocular movements intact.     Conjunctiva/sclera: Conjunctivae normal.     Pupils: Pupils are equal, round, and reactive to light.  Cardiovascular:     Rate and Rhythm: Normal rate and regular rhythm.     Heart sounds: Normal heart sounds.  Pulmonary:     Effort: No respiratory distress.     Breath sounds: Normal breath sounds. No wheezing.  Abdominal:     Palpations: Abdomen is soft.      Tenderness: There is no abdominal tenderness.  Musculoskeletal:        General: Swelling present. No tenderness.     Cervical back: Neck supple.     Right lower leg: No edema.     Left lower leg: No edema.     Comments: Mild swelling in ankles and lower legs without tenderness to palpation.  Lymphadenopathy:     Cervical: No cervical adenopathy.  Skin:    General: Skin is warm and dry.     Findings: No rash.  Neurological:     Mental Status: She is alert and oriented to person, place, and time.      UC Treatments / Results  Labs (all labs ordered are listed, but only abnormal results are displayed) Labs Reviewed  COMPLETE METABOLIC PANEL WITH GFR  POCT URINALYSIS DIP (MANUAL ENTRY) Color, UA yellow yellow     Clarity, UA clear clear     Glucose, UA negative mg/dL negative     Bilirubin, UA negative negative     Ketones, POC UA negative mg/dL negative     Spec Grav, UA 1.010 - 1.025 1.025     Blood, UA negative negative     pH, UA 5.0 - 8.0 5.5     Protein Ur, POC negative mg/dL negative     Urobilinogen, UA 0.2 or 1.0 E.U./dL 0.2     Nitrite, UA Negative Negative     Leukocytes, UA Negative Negative       POCT CBC W AUTO DIFF ([facility]):  WBC 6.1; LY 33.8; MO 7.0; GR 59.2; Hgb 11.7; Platelets 336     EKG   Radiology  No results found.  Procedures Procedures (including critical care time)  Medications Ordered in UC Medications  methylPREDNISolone sodium succinate (SOLU-MEDROL) 125 mg/2 mL injection 80 mg (has no administration in time range)    Initial Impression / Assessment and Plan / UC Course  I have reviewed the triage vital signs and the nursing notes.  Pertinent labs & imaging results that were available during my care of the patient were reviewed by me and considered in my medical decision making (see chart for details).    Administered Solumedrol 80mg  IM, then begin prednisone burst/taper. Note normal urinalysis without proteinuria.   CBC unremarkable except for mild anemia.  Check CMP for renal function. Followup with Family Doctor in about 3 days.   Final Clinical Impressions(s) / UC Diagnoses   Final diagnoses:  Allergic reaction, initial encounter     Discharge Instructions     Begin prednisone Thursday 07/19/20. May take Pepcid [facility] once daily with antihistamine of choice.  If symptoms become significantly worse during the night or over the weekend, proceed to the local emergency room.     ED Prescriptions    Medication Sig Dispense Auth. Provider   predniSONE (DELTASONE) 20 MG tablet Take one tab by mouth twice daily for 4 days, then one daily for 3 days. Take with food. 11 tablet [facility], MD        [name], MD 07/22/20 [phone]

## 2020-07-18 NOTE — Discharge Instructions (Addendum)
Begin prednisone Thursday 07/19/20. May take Pepcid [facility] once daily with antihistamine of choice.  If symptoms become significantly worse during the night or over the weekend, proceed to the local emergency room.

## 2020-07-19 LAB — COMPLETE METABOLIC PANEL WITH GFR
AG Ratio: 2.2 (calc) (ref 1.0–2.5)
ALT: 18 U/L (ref 6–29)
AST: 18 U/L (ref 10–30)
Albumin: 4.3 g/dL (ref 3.6–5.1)
Alkaline phosphatase (APISO): 30 U/L — ABNORMAL LOW (ref 31–125)
BUN: 24 mg/dL (ref 7–25)
CO2: 26 mmol/L (ref 20–32)
Calcium: 9.6 mg/dL (ref 8.6–10.2)
Chloride: 105 mmol/L (ref 98–110)
Creat: 0.63 mg/dL (ref 0.50–1.10)
GFR, Est African American: 127 mL/min/{1.73_m2} (ref >=60)
GFR, Est Non African American: 110 mL/min/{1.73_m2} (ref >=60)
Globulin: 2 g/dL (calc) (ref 1.9–3.7)
Glucose, Bld: 89 mg/dL (ref 65–99)
Potassium: 3.7 mmol/L (ref 3.5–5.3)
Sodium: 140 mmol/L (ref 135–146)
Total Bilirubin: 0.4 mg/dL (ref 0.2–1.2)
Total Protein: 6.3 g/dL (ref 6.1–8.1)

## 2020-07-20 ENCOUNTER — Encounter: Payer: Self-pay | Admitting: Family Medicine

## 2020-07-20 ENCOUNTER — Telehealth (INDEPENDENT_AMBULATORY_CARE_PROVIDER_SITE_OTHER): Payer: Federal, State, Local not specified - PPO | Admitting: Family Medicine

## 2020-07-20 DIAGNOSIS — Z91018 Allergy to other foods: Secondary | ICD-10-CM | POA: Diagnosis not present

## 2020-07-20 DIAGNOSIS — T7819XA Other adverse food reactions, not elsewhere classified, initial encounter: Secondary | ICD-10-CM

## 2020-07-20 DIAGNOSIS — J4540 Moderate persistent asthma, uncomplicated: Secondary | ICD-10-CM | POA: Diagnosis not present

## 2020-07-20 DIAGNOSIS — T781XXA Other adverse food reactions, not elsewhere classified, initial encounter: Secondary | ICD-10-CM

## 2020-07-20 DIAGNOSIS — Z91010 Allergy to peanuts: Secondary | ICD-10-CM | POA: Diagnosis not present

## 2020-07-20 DIAGNOSIS — M2540 Effusion, unspecified joint: Secondary | ICD-10-CM

## 2020-07-20 NOTE — Progress Notes (Signed)
Pt reports that she has been having issues with asthma. She has been experiencing some severe swelling. Swelling in legs and feet. She has a job that she has to stand and walk. She has also c/o itching and hives. Taking benadryl this helps some but causes her to be super tired and she has been using her nebulizer more.    She denies having a fever. She has only had a headache.    Pt was given a prednisone taper and solu-medrol injection.

## 2020-07-20 NOTE — Assessment & Plan Note (Signed)
She feels like this exacerbation has flared her asthma.  She was started on prednisone yesterday encouraged her to continue with that it should help.  Continue to use albuterol liberally.

## 2020-07-20 NOTE — Assessment & Plan Note (Signed)
This maximizing her treatment to provide symptom relief.  We will have her increase her cetirizine to 5 mg twice a day she is extremely sensitive to it and when she takes it she says she usually only takes about 1/8 of a teaspoon.  She finds it very sedating.  We will also have her increase her Claritin 10 mg to twice a day.  I want her to start ranitidine either the 75 or the 150s twice a day through the weekend hopefully she can return to work by Monday but if we need to extend it from a day a day she will let us know.  Make sure to continue and complete the prednisone and avoid additional triggers.  We will go ahead and place referral to an allergy specialist.

## 2020-07-20 NOTE — Progress Notes (Signed)
Virtual Visit via Video Note  I connected with Toni Harmon on 07/20/20 at  4:00 PM EDT by a video enabled telemedicine application and verified that I am speaking with the correct person using two identifiers.   I discussed the limitations of evaluation and management by telemedicine and the availability of in person appointments. The patient expressed understanding and agreed to proceed.  Patient location: at home.  Provider location: in office  Subjective:    CC: food allergic reaction.   HPI: Pt reports that she has been having issues with asthma. Noticed some swelling in her throat Tuesday night and took Benadryl and it did help. Marland Kitchen Swelling in legs and feet. She has a job that she has to stand and walk. She has also c/o itching and hives. Taking benadryl this helps some but causes her to be super tired and she has been using her nebulizer more. Has food allergies. Had corn.  Getting hives on her stomach and legs.  Itching in her scalp.  Hx of eczema.  Given solumedrol in the UC 2 days.  She vomited this AM after work up. She is not nauseated but vomits when she eats.  No diarrhea.  No fver or chills.     She denies having a fever. She has only had a headache.    Pt was given a prednisone taper and solu-medrol injection.   She has had joint swelling about 7 days.     Past medical history, Surgical history, Family history not pertinant except as noted below, Social history, Allergies, and medications have been entered into the medical record, reviewed, and corrections made.   Review of Systems: No fevers, chills, night sweats, weight loss, chest pain, or shortness of breath.   Objective:    General: Speaking clearly in complete sentences without any shortness of breath.  Alert and oriented x3.  Normal judgment. No apparent acute distress.    Impression and Recommendations:    Food allergy, peanut She has a history of peanut allergy and multiple foods.  Was previously  followed by an allergist at the [facility] but has not established with anybody locally.  New referral placed for [place] location which is closer to her home.  Moderate persistent asthma She feels like this exacerbation has flared her asthma.  She was started on prednisone yesterday encouraged her to continue with that it should help.  Continue to use albuterol liberally.  Allergic reaction to food This maximizing her treatment to provide symptom relief.  We will have her increase her cetirizine to 5 mg twice a day she is extremely sensitive to it and when she takes it she says she usually only takes about 1/8 of a teaspoon.  She finds it very sedating.  We will also have her increase her Claritin 10 mg to twice a day.  I want her to start ranitidine either the 75 or the 150s twice a day through the weekend hopefully she can return to work by Monday but if we need to extend it from a day a day she will let us know.  Make sure to continue and complete the prednisone and avoid additional triggers.  We will go ahead and place referral to an allergy specialist.  Joint swelling particularly in her lower extremities-suspect this is probably just a synovitis related to the acute inflammation but we can certainly do additional work-up to look for other autoimmune processes.  I do want her to wait until she has been off of  the prednisone for about a week before she goes and gets the labs drawn.    Time spent in encounter 30 minutes  I discussed the assessment and treatment plan with the patient. The patient was provided an opportunity to ask questions and all were answered. The patient agreed with the plan and demonstrated an understanding of the instructions.   The patient was advised to call back or seek an in-person evaluation if the symptoms worsen or if the condition fails to improve as anticipated.   Nani Gasser, MD

## 2020-07-20 NOTE — Assessment & Plan Note (Signed)
She has a history of peanut allergy and multiple foods.  Was previously followed by an allergist at the [facility] but has not established with anybody locally.  New referral placed for [place] location which is closer to her home.

## 2020-07-20 NOTE — Assessment & Plan Note (Signed)
>>  ASSESSMENT AND PLAN FOR MODERATE PERSISTENT ASTHMA WRITTEN ON 07/20/2020  5:19 PM BY METHENEY, CATHERINE D, MD  She feels like this exacerbation has flared her asthma.  She was started on prednisone  yesterday encouraged her to continue with that it should help.  Continue to use albuterol  liberally.

## 2020-07-23 ENCOUNTER — Encounter: Payer: Self-pay | Admitting: Family Medicine

## 2020-07-23 NOTE — Telephone Encounter (Signed)
Ok for note from 10/10, ok to return tomorrow

## 2020-08-17 DIAGNOSIS — Z3169 Encounter for other general counseling and advice on procreation: Secondary | ICD-10-CM | POA: Diagnosis not present

## 2020-08-27 ENCOUNTER — Encounter: Payer: Self-pay | Admitting: Family Medicine

## 2020-08-27 DIAGNOSIS — D509 Iron deficiency anemia, unspecified: Secondary | ICD-10-CM

## 2020-08-29 NOTE — Telephone Encounter (Signed)
Changed location to [facility]

## 2020-09-04 ENCOUNTER — Encounter: Payer: Self-pay | Admitting: General Practice

## 2020-09-04 DIAGNOSIS — N96 Recurrent pregnancy loss: Secondary | ICD-10-CM | POA: Diagnosis not present

## 2020-09-04 DIAGNOSIS — Z3141 Encounter for fertility testing: Secondary | ICD-10-CM | POA: Diagnosis not present

## 2020-09-10 ENCOUNTER — Ambulatory Visit: Payer: Federal, State, Local not specified - PPO | Admitting: Allergy and Immunology

## 2020-10-10 ENCOUNTER — Other Ambulatory Visit: Payer: Self-pay | Admitting: Osteopathic Medicine

## 2020-10-10 ENCOUNTER — Other Ambulatory Visit: Payer: Self-pay | Admitting: Family Medicine

## 2020-10-11 ENCOUNTER — Ambulatory Visit: Payer: Federal, State, Local not specified - PPO | Admitting: Family Medicine

## 2020-10-18 ENCOUNTER — Ambulatory Visit: Payer: Federal, State, Local not specified - PPO | Admitting: Family Medicine

## 2020-10-18 ENCOUNTER — Encounter: Payer: Self-pay | Admitting: Family Medicine

## 2020-10-18 ENCOUNTER — Other Ambulatory Visit: Payer: Self-pay

## 2020-10-18 DIAGNOSIS — J4540 Moderate persistent asthma, uncomplicated: Secondary | ICD-10-CM

## 2020-10-18 DIAGNOSIS — R209 Unspecified disturbances of skin sensation: Secondary | ICD-10-CM

## 2020-10-18 DIAGNOSIS — D509 Iron deficiency anemia, unspecified: Secondary | ICD-10-CM | POA: Diagnosis not present

## 2020-10-18 DIAGNOSIS — K580 Irritable bowel syndrome with diarrhea: Secondary | ICD-10-CM

## 2020-10-18 MED ORDER — PREDNISONE 20 MG PO TABS: ORAL_TABLET | ORAL | 3 refills | Status: DC

## 2020-10-18 NOTE — Progress Notes (Signed)
Office Visit Note   Patient: Toni Harmon           Date of Birth: 1976-03-14           MRN: [id number] Visit Date: 10/18/2020 Requested by: [name], DO [address] [place],  [place] [address] PCP: [name], DO  Subjective: Chief Complaint  Patient presents with  . Other    Has food intolerances and allergies. Inflammation in body. Concerned about her thyroid.     HPI: She is here for a functional medicine visit.  The appointment was not scheduled that way, so we do not have time to go into complete detail.  She has struggled with asthma for quite a while.  She uses albuterol and occasional prednisone, although when she takes prednisone she usually has to take Benadryl along with it because it typically causes a reaction.    She is also allergic to many things including foods.  She has been diagnosed with leaky gut syndrome in the past as well as irritable bowel syndrome.  She has a history of anemia requiring iron infusions a couple years ago.  Even before that she has had issues with cold hands and feet.  She wonders whether she might have thyroid dysfunction although traditional testing has been normal.                ROS:   All other systems were reviewed and are negative.  Objective: Vital Signs: There were no vitals taken for this visit.  Physical Exam:  General:  Alert and oriented, in no acute distress. Pulm:  Breathing unlabored. Psy:  Normal mood, congruent affect. Skin: No visible rash. CV: Regular rate and rhythm without murmurs, rubs, or gallops.  No peripheral edema.  2+ radial and posterior tibial pulses. Lungs: Clear to auscultation throughout with no wheezing or areas of consolidation.    Imaging: No results found.  Assessment & Plan: 1. Asthma -Refilled prednisone.  2.  Gut dysfunction - Information given about food elimination diet.  Currently doing keto.  May need food sensitivity testing if elimination diet  not revealing.  3.  Anemia; cold hands & feet - Labs including TPO antibodies - Return soon for 40 minute visit, and to discuss results.     Procedures: No procedures performed        PMFS History: Patient Active Problem List   Diagnosis Date Noted  . Allergic reaction to food 07/20/2020  . Asthma, chronic, severe persistent, uncomplicated 04/25/2020  . Abnormal findings in stool 02/05/2020  . Lung nodule < 6cm on CT 08/12/2018  . HLD (hyperlipidemia) 12/28/2017  . Family history of cervical cancer 12/25/2017  . Vitamin D deficiency 09/02/2016  . Iron deficiency anemia 09/02/2016  . Essential hypertension 11/16/2012  . Moderate persistent asthma 11/16/2012  . Degeneration of cervical intervertebral disc 11/16/2012  . Allergic rhinitis 11/16/2012  . IBS (irritable bowel syndrome) 11/16/2012  . Anxiety and depression 11/16/2012  . Food allergy, peanut 08/18/2012  . 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 Asthma   . GERD (gastroesophageal reflux disease)   . Hypertension    only to combat the effects of hctz    Family History  Problem Relation Age of Onset  . Cervical cancer Mother 29  . Hypertension Mother   . Food Allergy Mother   . Diabetes Maternal Grandfather     Past Surgical History:  Procedure Laterality Date  .  APPENDECTOMY    . DILATION AND CURETTAGE OF UTERUS  12/2011  . TONSILLECTOMY  2003  . TOOTH EXTRACTION     Social History   Occupational History  . Not on file  Tobacco Use  . Smoking status: Never Smoker  . Smokeless tobacco: Never Used  Vaping Use  . Vaping Use: Never used  Substance and Sexual Activity  . Alcohol use: No    Alcohol/week: 0.0 standard drinks  . Drug use: No  . Sexual activity: Yes    Partners: Male    Birth control/protection: None

## 2020-10-21 LAB — CBC WITH DIFFERENTIAL/PLATELET
Absolute Monocytes: 747 cells/uL (ref 200–950)
Basophils Absolute: 33 cells/uL (ref 0–200)
Basophils Relative: 0.4 %
Eosinophils Absolute: 100 cells/uL (ref 15–500)
Eosinophils Relative: 1.2 %
HCT: 39.1 % (ref 35.0–45.0)
Hemoglobin: 13.1 g/dL (ref 11.7–15.5)
Lymphs Abs: 3162 cells/uL (ref 850–3900)
MCH: 30.2 pg (ref 27.0–33.0)
MCHC: 33.5 g/dL (ref 32.0–36.0)
MCV: 90.1 fL (ref 80.0–100.0)
MPV: 9.2 fL (ref 7.5–12.5)
Monocytes Relative: 9 %
Neutro Abs: 4258 cells/uL (ref 1500–7800)
Neutrophils Relative %: 51.3 %
Platelets: 343 10*3/uL (ref 140–400)
RBC: 4.34 10*6/uL (ref 3.80–5.10)
RDW: 12.7 % (ref 11.0–15.0)
Total Lymphocyte: 38.1 %
WBC: 8.3 10*3/uL (ref 3.8–10.8)

## 2020-10-21 LAB — THYROID PANEL WITH TSH
Free Thyroxine Index: 3.5 (ref 1.4–3.8)
T3 Uptake: 33 % (ref 22–35)
T4, Total: 10.6 ug/dL (ref 5.1–11.9)
TSH: 2.15 mIU/L

## 2020-10-21 LAB — COMPREHENSIVE METABOLIC PANEL
AG Ratio: 2 (calc) (ref 1.0–2.5)
ALT: 28 U/L (ref 6–29)
AST: 19 U/L (ref 10–30)
Albumin: 4.6 g/dL (ref 3.6–5.1)
Alkaline phosphatase (APISO): 43 U/L (ref 31–125)
BUN: 24 mg/dL (ref 7–25)
CO2: 28 mmol/L (ref 20–32)
Calcium: 10.7 mg/dL — ABNORMAL HIGH (ref 8.6–10.2)
Chloride: 100 mmol/L (ref 98–110)
Creat: 0.93 mg/dL (ref 0.50–1.10)
Globulin: 2.3 g/dL (calc) (ref 1.9–3.7)
Glucose, Bld: 72 mg/dL (ref 65–99)
Potassium: 4.7 mmol/L (ref 3.5–5.3)
Sodium: 140 mmol/L (ref 135–146)
Total Bilirubin: 0.3 mg/dL (ref 0.2–1.2)
Total Protein: 6.9 g/dL (ref 6.1–8.1)

## 2020-10-21 LAB — IRON,TIBC AND FERRITIN PANEL
%SAT: 21 % (calc) (ref 16–45)
Ferritin: 118 ng/mL (ref 16–232)
Iron: 78 ug/dL (ref 40–190)
TIBC: 366 mcg/dL (calc) (ref 250–450)

## 2020-10-21 LAB — THYROID PEROXIDASE ANTIBODY: Thyroperoxidase Ab SerPl-aCnc: <1 IU/mL (ref <9)

## 2020-10-21 LAB — VITAMIN D 25 HYDROXY (VIT D DEFICIENCY, FRACTURES): Vit D, 25-Hydroxy: 71 ng/mL (ref 30–100)

## 2020-10-21 LAB — ANA: Anti Nuclear Antibody (ANA): NEGATIVE

## 2020-10-24 ENCOUNTER — Ambulatory Visit: Payer: Federal, State, Local not specified - PPO | Admitting: Family Medicine

## 2020-10-25 ENCOUNTER — Ambulatory Visit (INDEPENDENT_AMBULATORY_CARE_PROVIDER_SITE_OTHER): Payer: Federal, State, Local not specified - PPO | Admitting: Osteopathic Medicine

## 2020-10-25 ENCOUNTER — Other Ambulatory Visit: Payer: Self-pay

## 2020-10-25 ENCOUNTER — Other Ambulatory Visit: Payer: Self-pay | Admitting: Family Medicine

## 2020-10-25 ENCOUNTER — Encounter: Payer: Self-pay | Admitting: Osteopathic Medicine

## 2020-10-25 VITALS — BP 109/53 | HR 88 | Temp 98.4°F | Wt 160.0 lb

## 2020-10-25 DIAGNOSIS — J019 Acute sinusitis, unspecified: Secondary | ICD-10-CM | POA: Diagnosis not present

## 2020-10-25 DIAGNOSIS — J4540 Moderate persistent asthma, uncomplicated: Secondary | ICD-10-CM

## 2020-10-25 MED ORDER — AMOXICILLIN-POT CLAVULANATE 875-125 MG PO TABS: 1.0000 | ORAL_TABLET | Freq: Two times a day (BID) | ORAL | 0 refills | Status: AC

## 2020-10-25 MED ORDER — IPRATROPIUM BROMIDE 0.06 % NA SOLN: 2.0000 | Freq: Four times a day (QID) | NASAL | 1 refills | Status: DC

## 2020-10-25 NOTE — Progress Notes (Signed)
HPI: Toni Harmon is a 45 y.o. female who  has a past medical history of Asthma, GERD (gastroesophageal reflux disease), and Hypertension.  she presents to [facility] today, 10/25/20,  for chief complaint of:  Ear congestion and SOB x2 weeks, no fever. Recently on steroids from ortho/functional medicine for Dx asthma.   Patient has several nonspecific somatic/respiratory complaints, known history of asthma/severe allergies.  She reports feeling short of breath for the past several weeks, similar to previous asthma flares but recent prescription of steroids did not really make much difference.  She was following with allergy/asthma but has not been back, was supposed to get allergy testing at some point but had to be off of her medications for a while, she was wanting to wait until she was feeling a bit better overall.  Also multiple GI issues, GI upset, seems to be triggered by foods and particularly has bad reactions with corn.  She was concerned that some of her medications may have corn based additives that might be contributing to her symptoms.  Greatest complaint today is ears feeling congested, worse on the right.  Has tried multiple allergy medications, nasal sprays   ASSESSMENT/PLAN: The primary encounter diagnosis was Moderate persistent asthma, unspecified whether complicated. A diagnosis of Subacute sinusitis, unspecified location was also pertinent to this visit.  Meds ordered this encounter  Medications   ipratropium (ATROVENT) 0.06 % nasal spray    Sig: Place 2 sprays into both nostrils 4 (four) times daily. As needed for sinus or ear congestion / postnasal drip    Dispense:  15 mL    Refill:  1   amoxicillin-clavulanate (AUGMENTIN) 875-125 MG tablet    Sig: Take 1 tablet by mouth 2 (two) times daily for 7 days. Fill Rx if nasal spray (atrovent) not helping sinuses/ears after few days of use    Dispense:  14 tablet    Refill:  0     Patient Instructions  Plan:  Trial Atrovent nasal spray (sent to pharmacy)  Can add saline nasal spray as needed to rinse but avoid other medicated sprays   OK to use Claritin/Zyrtec/Allegra instead of Benadryl  OK to continue Sudafed as needed   Would fill printed Rx for antibiotics if the above measures aren't helping after a few days   If that isn't helping, please contact allergy/asthma specialist  Would also consider autoimmune testing / allergy testing once you've been off steroids a bit longer (2+ weeks)        Follow-up plan: Return if symptoms worsen or fail to improve.                                                 ################################################# ################################################# ################################################# #################################################    Current Meds  Medication Sig   albuterol (PROVENTIL) (2.5 MG/3ML) 0.083% nebulizer solution TAKE 3 MLS (2.5 MG TOTAL) BY NEBULIZATION EVERY 6 (SIX) HOURS AS NEEDED FOR WHEEZING OR SHORTNESS OF BREATH.   ALPRAZolam (XANAX) 0.5 MG tablet Take 0.5-1 tablets (0.25-0.5 mg total) by mouth daily as needed for anxiety (panic attack).   AMBULATORY NON FORMULARY MEDICATION Nebulizer with necessary accessories.  Dx: Moderate Persistent Asthma   amoxicillin-clavulanate (AUGMENTIN) 875-125 MG tablet Take 1 tablet by mouth 2 (two) times daily for 7 days. Fill Rx if nasal spray (atrovent) not helping sinuses/ears after  few days of use   budesonide (RHINOCORT AQUA) 32 MCG/ACT nasal spray Place 2 sprays into both nostrils daily.   cetirizine HCl (ZYRTEC) 5 MG/5ML SOLN Take 2.5 mg by mouth daily. Patient takes  1/4 teaspoon at bedtime.   EPINEPHrine (EPIPEN 2-PAK) 0.3 mg/0.3 mL IJ SOAJ injection Inject 0.3 mLs (0.3 mg total) into the muscle as needed for anaphylaxis.   escitalopram (LEXAPRO) 10 MG tablet Take 1  tablet (10 mg total) by mouth daily. appt for further refills   hydrochlorothiazide (HYDRODIURIL) 25 MG tablet Take 1 tablet (25 mg total) by mouth daily.   ibuprofen (ADVIL,MOTRIN) 600 MG tablet 2 (two) times daily as needed.   ipratropium (ATROVENT) 0.06 % nasal spray Place 2 sprays into both nostrils 4 (four) times daily. As needed for sinus or ear congestion / postnasal drip   loratadine (CLARITIN) 10 MG tablet Take 1 tablet (10 mg total) by mouth daily.   montelukast (SINGULAIR) 10 MG tablet TAKE 1 TABLET BY MOUTH EVERYDAY AT BEDTIME   predniSONE (DELTASONE) 20 MG tablet Take one tab by mouth twice daily for 4 days, then one daily for 3 days. Take with food.   Prenatal Vit-Fe Fumarate-FA (PRENATAL MULTIVITAMIN) TABS tablet Take 1 tablet by mouth daily at 12 noon.   Spacer/Aero-Holding Chambers (AEROCHAMBER PLUS) inhaler Use as instructed   vitamin C (ASCORBIC ACID) 500 MG tablet Take 500 mg by mouth daily.   WIXELA INHUB 500-50 MCG/DOSE AEPB TAKE 1 PUFF BY MOUTH TWICE A DAY   [DISCONTINUED] albuterol (VENTOLIN HFA) 108 (90 Base) MCG/ACT inhaler TAKE 2 PUFFS BY MOUTH EVERY 6 HOURS AS NEEDED FOR WHEEZE OR SHORTNESS OF BREATH    Allergies  Allergen Reactions   Compazine [Prochlorperazine Edisylate] Other (See Comments)    seizure   Corn-Containing Products Hives, Diarrhea and Other (See Comments)   Peanut-Containing Drug Products Anaphylaxis   Ppd [Tuberculin Purified Protein Derivative] Hives and Swelling    Patient stated,"can't receive the injection anymore per MD order. Must get the xray."   Sesame Oil Hives   Soybean Oil Anaphylaxis   Eggs Or Egg-Derived Products Nausea And Vomiting   Other     Tree nuts, melon, gluten intolerance   Shellfish Allergy Swelling   Latex Hives and Rash       Review of Systems: Pertinent (+) and (-) ROS in HPI as above   Exam:  BP (!) 109/53    Pulse 88    Temp 98.4 F (36.9 C) (Oral)    Wt 160 lb (72.6 kg)    SpO2 100%     BMI 25.82 kg/m   Constitutional: VS see above. General Appearance: alert, well-developed, well-nourished, NAD  Neck: No masses, trachea midline.   Respiratory: Normal respiratory effort. no wheeze, no rhonchi, no rales  Cardiovascular: S1/S2 normal, no murmur, no rub/gallop auscultated. RRR.   Musculoskeletal: Gait normal. Symmetric and independent movement of all extremities  Abdominal: non-tender, non-distended, no appreciable organomegaly, neg Murphy's, BS WNLx4  Neurological: Normal balance/coordination. No tremor.  Skin: warm, dry, intact.   Psychiatric: Normal judgment/insight. Normal mood and affect. Oriented x3.       Visit summary with medication list and pertinent instructions was printed for patient to review, patient was advised to alert [place] if any updates are needed. All questions at time of visit were answered - patient instructed to contact office with any additional concerns. ER/RTC precautions were reviewed with the patient and understanding verbalized.      Please note: voice recognition  software was used to produce this document, and typos may escape review. Please contact Dr. Lyn Hollingshead for any needed clarifications.    Follow up plan: Return if symptoms worsen or fail to improve.

## 2020-10-25 NOTE — Patient Instructions (Addendum)
Plan:  Trial Atrovent nasal spray (sent to pharmacy)  Can add saline nasal spray as needed to rinse but avoid other medicated sprays   OK to use Claritin/Zyrtec/Allegra instead of Benadryl  OK to continue Sudafed as needed   Would fill printed Rx for antibiotics if the above measures aren't helping after a few days   If that isn't helping, please contact allergy/asthma specialist  Would also consider autoimmune testing / allergy testing once you've been off steroids a bit longer (2+ weeks)

## 2020-10-30 ENCOUNTER — Ambulatory Visit: Payer: Federal, State, Local not specified - PPO | Admitting: Family Medicine

## 2020-11-05 ENCOUNTER — Telehealth: Payer: Self-pay | Admitting: Family Medicine

## 2020-11-05 MED ORDER — AZITHROMYCIN 250 MG PO TABS: ORAL_TABLET | ORAL | 0 refills | Status: DC

## 2020-11-05 MED ORDER — PREDNISONE 10 MG PO TABS: ORAL_TABLET | ORAL | 0 refills | Status: DC

## 2020-11-05 NOTE — Telephone Encounter (Signed)
She called stating she has been sick and feeling badly for at least a week.  She was tested for Covid and it was negative.  She has had head congestion and pressure, wheezing and chest tightness.  She has not yet started checking her oxygen levels but plans to purchase a pulse oximeter to do so.  I will call in a Z-Pak and a prednisone Dosepak.  We will also give her a note to return to work on Monday presuming she is feeling better.

## 2020-11-05 NOTE — Telephone Encounter (Signed)
She has MyChart - would you want to message her?

## 2020-11-05 NOTE — Telephone Encounter (Signed)
Pt called and said she has shortness of breath and a few other covid symptoms and wants to know if you can give her a call. She wanted to be seen and I told her I would just send you a message and have one of you guys call her. [phone]

## 2020-11-05 NOTE — Addendum Note (Signed)
Addended by: Lillia Carmel on: 11/05/2020 11:42 AM   Modules accepted: Orders

## 2020-11-13 ENCOUNTER — Encounter: Payer: Self-pay | Admitting: Family Medicine

## 2020-11-28 ENCOUNTER — Other Ambulatory Visit: Payer: Self-pay

## 2020-11-28 ENCOUNTER — Encounter: Payer: Self-pay | Admitting: Family Medicine

## 2020-11-28 ENCOUNTER — Ambulatory Visit: Payer: Federal, State, Local not specified - PPO | Admitting: Family Medicine

## 2020-11-28 VITALS — BP 95/67 | HR 89 | Ht 66.0 in | Wt 150.8 lb

## 2020-11-28 DIAGNOSIS — R209 Unspecified disturbances of skin sensation: Secondary | ICD-10-CM

## 2020-11-28 DIAGNOSIS — K9049 Malabsorption due to intolerance, not elsewhere classified: Secondary | ICD-10-CM

## 2020-11-28 DIAGNOSIS — K580 Irritable bowel syndrome with diarrhea: Secondary | ICD-10-CM

## 2020-11-28 DIAGNOSIS — J4540 Moderate persistent asthma, uncomplicated: Secondary | ICD-10-CM | POA: Diagnosis not present

## 2020-11-28 DIAGNOSIS — D509 Iron deficiency anemia, unspecified: Secondary | ICD-10-CM | POA: Diagnosis not present

## 2020-11-28 NOTE — Progress Notes (Signed)
Office Visit Note   Patient: Toni Harmon           Date of Birth: Jan 21, 1976           MRN: [id number] Visit Date: 11/28/2020 Requested by: [name], DO [address] [place],  [place] [address] PCP: [name], DO  Subjective: Chief Complaint  Patient presents with  . Other    Functional medicine consult for food intolerances/allergies, inflammation in the body and thyroid concerns. Has paperwork for when she was out with covid - [employer] (she is a Cytogeneticist).    HPI: She is here for further discussion of chronic gastrointestinal problems with possible thyroid dysfunction.  She states that when she was 27 she was in the [employer] and had to get a series of vaccines.  Almost immediately afterward, she began vomiting.  She was treated for dehydration.  Ever since then, she has had trouble with gut dysfunction with alternating diarrhea and constipation.  She now has multiple food allergies including peanuts, corn, sesame, soy, eggs, shellfish.  Also allergic to compazine and latex.  She has struggled with asthma since then as well.  She is currently eating a ketogenic diet with one meal daily, able to tolerate olive oil, chicken and organic spices.    No family history of such issues.    Labs were negative for autoimmune disease per infertility clinic.                ROS:   All other systems were reviewed and are negative.    Objective: Vital Signs: BP 95/67   Pulse 89   Ht 5\' 6"  (1.676 m)   Wt 150 lb 12.8 oz (68.4 kg)   BMI 24.34 kg/m   Physical Exam:  General:  Alert and oriented, in no acute distress. Pulm:  Breathing unlabored. Psy:  Normal mood, congruent affect. Skin:  No rash  Neck: No thyromegaly or nodules, no lymphadenopathy. CV: Regular rate and rhythm without murmurs, rubs, or gallops.  No peripheral edema.  2+ radial and posterior tibial pulses. Lungs: Clear to auscultation throughout with no wheezing or areas of  consolidation. Extremities: 2+ knee and 1+ ankle DTRs. No nail deformities.   Imaging: No results found.  Assessment & Plan: 1.  Chronic gut dysfunction - Will do stool testing and food sensitivity testing. - Check for MTHFR mutation. - Depending on results, consider Enteromend along with dietary changes if indicated.     Procedures: No procedures performed        PMFS History: Patient Active Problem List   Diagnosis Date Noted  . Allergic reaction to food 07/20/2020  . Asthma, chronic, severe persistent, uncomplicated 04/25/2020  . Abnormal findings in stool 02/05/2020  . Lung nodule < 6cm on CT 08/12/2018  . HLD (hyperlipidemia) 12/28/2017  . Family history of cervical cancer 12/25/2017  . Vitamin D deficiency 09/02/2016  . Iron deficiency anemia 09/02/2016  . Essential hypertension 11/16/2012  . Moderate persistent asthma 11/16/2012  . Degeneration of cervical intervertebral disc 11/16/2012  . Allergic rhinitis 11/16/2012  . IBS (irritable bowel syndrome) 11/16/2012  . Anxiety and depression 11/16/2012  . Food allergy, peanut 08/18/2012  . 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 Asthma   . GERD (gastroesophageal reflux disease)   . Hypertension    only to combat the effects of hctz    Family History  Problem Relation Age of Onset  .  Cervical cancer Mother 37  . Hypertension Mother   . Food Allergy Mother   . Diabetes Maternal Grandfather     Past Surgical History:  Procedure Laterality Date  . APPENDECTOMY    . DILATION AND CURETTAGE OF UTERUS  12/2011  . TONSILLECTOMY  2003  . TOOTH EXTRACTION     Social History   Occupational History  . Not on file  Tobacco Use  . Smoking status: Never Smoker  . Smokeless tobacco: Never Used  Vaping Use  . Vaping Use: Never used  Substance and Sexual Activity  . Alcohol use: No    Alcohol/week: 0.0 standard drinks  . Drug use: No  . Sexual  activity: Yes    Partners: Male    Birth control/protection: None

## 2020-11-30 ENCOUNTER — Encounter: Payer: Self-pay | Admitting: Family Medicine

## 2020-12-03 ENCOUNTER — Ambulatory Visit: Payer: Federal, State, Local not specified - PPO | Admitting: Gastroenterology

## 2020-12-03 ENCOUNTER — Encounter: Payer: Self-pay | Admitting: *Deleted

## 2020-12-14 ENCOUNTER — Other Ambulatory Visit: Payer: Self-pay | Admitting: Family Medicine

## 2020-12-21 DIAGNOSIS — N979 Female infertility, unspecified: Secondary | ICD-10-CM | POA: Diagnosis not present

## 2020-12-21 DIAGNOSIS — J309 Allergic rhinitis, unspecified: Secondary | ICD-10-CM | POA: Diagnosis not present

## 2020-12-21 DIAGNOSIS — K589 Irritable bowel syndrome without diarrhea: Secondary | ICD-10-CM | POA: Diagnosis not present

## 2020-12-21 DIAGNOSIS — F39 Unspecified mood [affective] disorder: Secondary | ICD-10-CM | POA: Diagnosis not present

## 2021-01-03 DIAGNOSIS — Z3169 Encounter for other general counseling and advice on procreation: Secondary | ICD-10-CM | POA: Diagnosis not present

## 2021-01-08 ENCOUNTER — Other Ambulatory Visit: Payer: Self-pay | Admitting: Osteopathic Medicine

## 2021-01-14 DIAGNOSIS — Z3141 Encounter for fertility testing: Secondary | ICD-10-CM | POA: Diagnosis not present

## 2021-01-15 DIAGNOSIS — Z3189 Encounter for other procreative management: Secondary | ICD-10-CM | POA: Diagnosis not present

## 2021-01-15 DIAGNOSIS — Z3141 Encounter for fertility testing: Secondary | ICD-10-CM | POA: Diagnosis not present

## 2021-01-27 ENCOUNTER — Other Ambulatory Visit: Payer: Self-pay | Admitting: Osteopathic Medicine

## 2021-01-27 DIAGNOSIS — J4540 Moderate persistent asthma, uncomplicated: Secondary | ICD-10-CM

## 2021-01-28 ENCOUNTER — Other Ambulatory Visit: Payer: Self-pay | Admitting: Osteopathic Medicine

## 2021-01-28 DIAGNOSIS — J4550 Severe persistent asthma, uncomplicated: Secondary | ICD-10-CM

## 2021-02-05 DIAGNOSIS — Z3141 Encounter for fertility testing: Secondary | ICD-10-CM | POA: Diagnosis not present

## 2021-02-07 DIAGNOSIS — Z3141 Encounter for fertility testing: Secondary | ICD-10-CM | POA: Diagnosis not present

## 2021-02-11 DIAGNOSIS — Z3189 Encounter for other procreative management: Secondary | ICD-10-CM | POA: Diagnosis not present

## 2021-02-23 ENCOUNTER — Other Ambulatory Visit: Payer: Self-pay | Admitting: Osteopathic Medicine

## 2021-03-06 DIAGNOSIS — Z3141 Encounter for fertility testing: Secondary | ICD-10-CM | POA: Diagnosis not present

## 2021-03-08 ENCOUNTER — Emergency Department (INDEPENDENT_AMBULATORY_CARE_PROVIDER_SITE_OTHER)
Admission: RE | Admit: 2021-03-08 | Discharge: 2021-03-08 | Disposition: A | Discharge status: Home / Self Care | Payer: Federal, State, Local not specified - PPO | Source: Ambulatory Visit

## 2021-03-08 ENCOUNTER — Other Ambulatory Visit: Payer: Self-pay

## 2021-03-08 VITALS — BP 113/78 | HR 89 | Temp 97.8°F | Resp 16 | Ht 66.0 in | Wt 150.0 lb

## 2021-03-08 DIAGNOSIS — J45901 Unspecified asthma with (acute) exacerbation: Secondary | ICD-10-CM

## 2021-03-08 DIAGNOSIS — Z3189 Encounter for other procreative management: Secondary | ICD-10-CM | POA: Diagnosis not present

## 2021-03-08 MED ORDER — PREDNISONE 20 MG PO TABS: 20.0000 mg | ORAL_TABLET | Freq: Every day | ORAL | 0 refills | Status: DC

## 2021-03-08 NOTE — ED Provider Notes (Signed)
[facility]    CSN: [id number] Arrival date & time: 03/08/21  1251      History   Chief Complaint Chief Complaint  Patient presents with  . Shortness of Breath    HPI Toni Harmon is a 45 y.o. female who presents due to she needs a note to be able to return to work.  She has hx of asthma and in the past week due to allergies it got worse. Has been using  her nebs more often since she cant use her inhaler at work. So she missed work for 3 days. Denies fever or myalgias. She feels she has improved some, but she could benefit from prednisone so she does not use her inhaler or nebs as oftern     Past Medical History:  Diagnosis Date  . Asthma   . GERD (gastroesophageal reflux disease)   . Hypertension    only to combat the effects of hctz    Patient Active Problem List   Diagnosis Date Noted  . Allergic reaction to food 07/20/2020  . Asthma, chronic, severe persistent, uncomplicated 04/25/2020  . Abnormal findings in stool 02/05/2020  . Lung nodule < 6cm on CT 08/12/2018  . HLD (hyperlipidemia) 12/28/2017  . Family history of cervical cancer 12/25/2017  . Vitamin D deficiency 09/02/2016  . Iron deficiency anemia 09/02/2016  . Essential hypertension 11/16/2012  . Moderate persistent asthma 11/16/2012  . Degeneration of cervical intervertebral disc 11/16/2012  . Allergic rhinitis 11/16/2012  . IBS (irritable bowel syndrome) 11/16/2012  . Anxiety and depression 11/16/2012  . Food allergy, peanut 08/18/2012  . 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 DILATION AND CURETTAGE OF UTERUS  12/2011  . TONSILLECTOMY    . TOOTH EXTRACTION      OB History    Gravida  5   Para      Term      Preterm      AB  4   Living  0     SAB  3   IAB  1   Ectopic      Multiple      Live Births               Home Medications    Prior to Admission medications   Medication Sig Start  Date End Date Taking? Authorizing Provider  predniSONE (DELTASONE) 20 MG tablet Take 1 tablet (20 mg total) by mouth daily with breakfast. 03/08/21  Yes Rodriguez-Southworth, Nettie Elm, PA-C  albuterol (PROVENTIL) (2.5 MG/3ML) 0.083% nebulizer solution TAKE 3 MLS (2.5 MG TOTAL) BY NEBULIZATION EVERY 6 (SIX) HOURS AS NEEDED FOR WHEEZING OR SHORTNESS OF BREATH. 03/07/20   [name], DO  albuterol (VENTOLIN HFA) 108 (90 Base) MCG/ACT inhaler INHALE 2 PUFFS BY MOUTH EVERY 4 HOURS AS NEEDED FOR WHEEZING 01/28/21   [name], DO  AMBULATORY NON FORMULARY MEDICATION Nebulizer with necessary accessories.  Dx: Moderate Persistent Asthma 10/26/13   Hommel, Sean, DO  budesonide (RHINOCORT AQUA) 32 MCG/ACT nasal spray Place 2 sprays into both nostrils daily. 04/25/19   [name], DO  cetirizine HCl (ZYRTEC) 5 MG/5ML SOLN Take 2.5 mg by mouth daily. Patient takes  1/4 teaspoon at bedtime.    [provider]  Choriogonadotropin Alfa 250 MCG/0.5ML injection INJECT (1 PREFILLED SYRINGE) SUBCUTANEOUSLY ONCE AS INSTRUCTED BY FERTILITY CLINIC 09/13/20   [provider]  EPINEPHrine (EPIPEN 2-PAK) 0.3 mg/0.3 mL IJ SOAJ injection Inject  0.3 mLs (0.3 mg total) into the muscle as needed for anaphylaxis. 02/17/20   [name], DO  escitalopram (LEXAPRO) 10 MG tablet TAKE 1 TABLET (10 MG TOTAL) BY MOUTH DAILY. APPT FOR FURTHER REFILLS 02/25/21   [name], DO  Fluticasone-Salmeterol [facility] INHUB) 500-50 MCG/DOSE AEPB INHALE 1 PUFF BY MOUTH TWICE A DAY 01/28/21   [name], DO  hydrochlorothiazide (HYDRODIURIL) 25 MG tablet TAKE 1 TABLET BY MOUTH EVERY DAY 12/14/20   [name], DO  ibuprofen (ADVIL,MOTRIN) 600 MG tablet 2 (two) times daily as needed. 12/10/16   [provider]  ipratropium (ATROVENT) 0.06 % nasal spray 2 SPRAYS INTO BOTH NOSTRILS 4 TIMES DAILY. AS NEEDED FOR SINUS OR EAR CONGESTION / POSTNASAL DRIP 01/28/21   [name], DO   loratadine (CLARITIN) 10 MG tablet Take 1 tablet (10 mg total) by mouth daily. 11/08/15   Hommel, [name], DO  montelukast (SINGULAIR) 10 MG tablet TAKE 1 TABLET BY MOUTH EVERYDAY AT BEDTIME 05/28/20   [name], DO  NON FORMULARY daily.    [provider]  Potassium 99 MG TABS Take by mouth daily.    [provider]  Prenatal Vit-Fe Fumarate-FA (PRENATAL MULTIVITAMIN) TABS tablet Take 1 tablet by mouth daily at 12 noon.    [provider]  Spacer/Aero-Holding Chambers (AEROCHAMBER PLUS) inhaler Use as instructed 01/16/20   [name], DO  vitamin C (ASCORBIC ACID) 500 MG tablet Take 500 mg by mouth daily.    [provider]    Family History Family History  Problem Relation Age of Onset  . Cervical cancer Mother 35  . Hypertension Mother   . Food Allergy Mother   . Cancer Mother   . Diabetes Maternal Grandfather     Social History Social History   Tobacco Use  . Smoking status: Never Smoker  . Smokeless tobacco: Never Used  Vaping Use  . Vaping Use: Never used  Substance Use Topics  . Alcohol use: No    Alcohol/week: 0.0 standard drinks  . Drug use: No     Allergies   Compazine [prochlorperazine edisylate], Corn-containing products, Peanut-containing drug products, Ppd [tuberculin purified protein derivative], Sesame oil, Soybean oil, Eggs or egg-derived products, Other, Shellfish allergy, and Latex   Review of Systems Review of Systems + wheezing and SOB which has improved, the rest is neg  Physical Exam Triage Vital Signs ED Triage Vitals  Enc Vitals Group     BP 03/08/21 1315 113/78     Pulse Rate 03/08/21 1315 89     Resp 03/08/21 1315 16     Temp 03/08/21 1315 97.8 F (36.6 C)     Temp Source 03/08/21 1315 Temporal     SpO2 03/08/21 1315 99 %     Weight 03/08/21 1311 150 lb (68 kg)     Height 03/08/21 1311 5\' 6"  (1.676 m)     Head Circumference --      Peak Flow --      Pain Score 03/08/21 1311 0     Pain Loc  --      Pain Edu? --      Excl. in GC? --    No data found.  Updated Vital Signs BP 113/78   Pulse 89   Temp 97.8 F (36.6 C) (Temporal)   Resp 16   Ht 5\' 6"  (1.676 m)   Wt 150 lb (68 kg)   LMP 02/26/2021 (Exact Date)   SpO2 99%   BMI 24.21 kg/m  Visual Acuity Right Eye Distance:   Left Eye Distance:   Bilateral Distance:    Right Eye Near:   Left Eye Near:    Bilateral Near:     Physical Exam Physical Exam Vitals signs and nursing note reviewed.  Constitutional:      General: She is not in acute distress.    Appearance: Normal appearance. She is not ill-appearing, toxic-appearing or diaphoretic.  HENT:     Head: Normocephalic.     Right Ear: Tympanic membrane, ear canal and external ear normal.     Left Ear: Tympanic membrane, ear canal and external ear normal.     Nose: Nose normal.     Mouth/Throat:     Mouth: Mucous membranes are moist.  Eyes:     General: No scleral icterus.       Right eye: No discharge.        Left eye: No discharge.     Conjunctiva/sclera: Conjunctivae normal.  Neck:     Musculoskeletal: Neck supple. No neck rigidity.  Cardiovascular:     Rate and Rhythm: Normal rate and regular rhythm.     Heart sounds: No murmur.  Pulmonary:     Effort: Pulmonary effort is normal.     Breath sounds: Normal breath sounds.   Musculoskeletal: Normal range of motion.  Lymphadenopathy:     Cervical: No cervical adenopathy.  Skin:    General: Skin is warm and dry.     Coloration: Skin is not jaundiced.     Findings: No rash.  Neurological:     Mental Status: She is alert and oriented to person, place, and time.     Gait: Gait normal.  Psychiatric:        Mood and Affect: Mood normal.        Behavior: Behavior normal.        Thought Content: Thought content normal.        Judgment: Judgment normal.     UC Treatments / Results  Labs (all labs ordered are listed, but only abnormal results are displayed) Labs Reviewed - No data to  display  EKG   Radiology No results found.  Procedures Procedures (including critical care time)  Medications Ordered in UC Medications - No data to display  Initial Impression / Assessment and Plan / UC Course  I have reviewed the triage vital signs and the nursing notes. Asthma improving, but not fully well yet. I placed her on Prednisone 20 mg qd x 5 days.  Final Clinical Impressions(s) / UC Diagnoses   Final diagnoses:  Mild asthma with exacerbation, unspecified whether persistent   Discharge Instructions   None    ED Prescriptions    Medication Sig Dispense Auth. Provider   predniSONE (DELTASONE) 20 MG tablet Take 1 tablet (20 mg total) by mouth daily with breakfast. 5 tablet Rodriguez-Southworth, Nettie Elm, PA-C     PDMP not reviewed this encounter.   Garey Ham, PA-C 03/08/21 1347

## 2021-03-08 NOTE — ED Triage Notes (Signed)
Pt st her asthma has been worse over the past three days and she has been able to give her self nebulizer treatment and it has helped but not totally feeling better. Pt needs work note.

## 2021-03-24 ENCOUNTER — Other Ambulatory Visit: Payer: Self-pay | Admitting: Osteopathic Medicine

## 2021-03-24 DIAGNOSIS — J4550 Severe persistent asthma, uncomplicated: Secondary | ICD-10-CM

## 2021-04-17 DIAGNOSIS — N943 Premenstrual tension syndrome: Secondary | ICD-10-CM | POA: Diagnosis not present

## 2021-04-17 DIAGNOSIS — R5383 Other fatigue: Secondary | ICD-10-CM | POA: Diagnosis not present

## 2021-04-17 DIAGNOSIS — K589 Irritable bowel syndrome without diarrhea: Secondary | ICD-10-CM | POA: Diagnosis not present

## 2021-04-17 DIAGNOSIS — N979 Female infertility, unspecified: Secondary | ICD-10-CM | POA: Diagnosis not present

## 2021-04-26 DIAGNOSIS — K589 Irritable bowel syndrome without diarrhea: Secondary | ICD-10-CM | POA: Diagnosis not present

## 2021-04-26 DIAGNOSIS — N943 Premenstrual tension syndrome: Secondary | ICD-10-CM | POA: Diagnosis not present

## 2021-04-26 DIAGNOSIS — G4700 Insomnia, unspecified: Secondary | ICD-10-CM | POA: Diagnosis not present

## 2021-04-26 DIAGNOSIS — R5383 Other fatigue: Secondary | ICD-10-CM | POA: Diagnosis not present

## 2021-05-09 DIAGNOSIS — N943 Premenstrual tension syndrome: Secondary | ICD-10-CM | POA: Diagnosis not present

## 2021-05-09 DIAGNOSIS — R5383 Other fatigue: Secondary | ICD-10-CM | POA: Diagnosis not present

## 2021-05-09 DIAGNOSIS — B2790 Infectious mononucleosis, unspecified without complication: Secondary | ICD-10-CM | POA: Diagnosis not present

## 2021-05-09 DIAGNOSIS — E039 Hypothyroidism, unspecified: Secondary | ICD-10-CM | POA: Diagnosis not present

## 2021-05-13 DIAGNOSIS — Z1231 Encounter for screening mammogram for malignant neoplasm of breast: Secondary | ICD-10-CM | POA: Diagnosis not present

## 2021-05-23 ENCOUNTER — Other Ambulatory Visit: Payer: Self-pay | Admitting: Osteopathic Medicine

## 2021-05-26 ENCOUNTER — Other Ambulatory Visit: Payer: Self-pay | Admitting: Osteopathic Medicine

## 2021-05-26 DIAGNOSIS — J4550 Severe persistent asthma, uncomplicated: Secondary | ICD-10-CM

## 2021-05-28 ENCOUNTER — Ambulatory Visit: Payer: Federal, State, Local not specified - PPO | Admitting: Osteopathic Medicine

## 2021-05-28 ENCOUNTER — Encounter: Payer: Self-pay | Admitting: Osteopathic Medicine

## 2021-05-28 ENCOUNTER — Other Ambulatory Visit: Payer: Self-pay

## 2021-05-28 VITALS — BP 118/73 | HR 78 | Temp 97.9°F | Wt 153.1 lb

## 2021-05-28 DIAGNOSIS — E7849 Other hyperlipidemia: Secondary | ICD-10-CM | POA: Diagnosis not present

## 2021-05-28 DIAGNOSIS — Z0000 Encounter for general adult medical examination without abnormal findings: Secondary | ICD-10-CM

## 2021-05-28 DIAGNOSIS — J4550 Severe persistent asthma, uncomplicated: Secondary | ICD-10-CM | POA: Diagnosis not present

## 2021-05-28 DIAGNOSIS — J4540 Moderate persistent asthma, uncomplicated: Secondary | ICD-10-CM | POA: Diagnosis not present

## 2021-05-28 DIAGNOSIS — D509 Iron deficiency anemia, unspecified: Secondary | ICD-10-CM

## 2021-05-28 MED ORDER — ALBUTEROL SULFATE HFA 108 (90 BASE) MCG/ACT IN AERS: INHALATION_SPRAY | RESPIRATORY_TRACT | 99 refills | Status: DC

## 2021-05-28 MED ORDER — MONTELUKAST SODIUM 10 MG PO TABS: ORAL_TABLET | ORAL | 3 refills | Status: DC

## 2021-05-28 MED ORDER — EPINEPHRINE 0.3 MG/0.3ML IJ SOAJ: 0.3000 mg | INTRAMUSCULAR | 99 refills | Status: DC | PRN

## 2021-05-28 MED ORDER — HYDROCHLOROTHIAZIDE 25 MG PO TABS: 25.0000 mg | ORAL_TABLET | Freq: Every day | ORAL | 3 refills | Status: DC

## 2021-05-28 MED ORDER — ESCITALOPRAM OXALATE 10 MG PO TABS: 10.0000 mg | ORAL_TABLET | Freq: Every day | ORAL | 3 refills | Status: DC

## 2021-05-28 NOTE — Patient Instructions (Signed)
General Preventive Care Most recent routine screening labs: ordered.  Blood pressure goal 130/80 or less.  Tobacco: don't!  Alcohol: responsible moderation is ok for most adults - if you have concerns about your alcohol intake, please talk to me!  Exercise: as tolerated to reduce risk of cardiovascular disease and diabetes. Strength training will also prevent osteoporosis.  Mental health: if need for mental health care (medicines, counseling, other), or concerns about moods, please let me know!  Sexual / Reproductive health: if need for STD testing, or if concerns with libido/pain problems, please let me know!  Advanced Directive: Living Will and/or Healthcare Power of Attorney recommended for all adults, regardless of age or health.  Vaccines Flu vaccine: for almost everyone, every fall.  Shingles vaccine: after age 62.  Pneumonia vaccines: after age 25. Tetanus booster: every 10 years  COVID vaccine: STRONGLY RECOMMENDED  Cancer screenings  Colon cancer screening: for everyone age 33 Breast cancer screening: mammogram every year or every other year Cervical cancer screening: Pap  per OBGYN.  Lung cancer screening: NOT NEEDED for non-smoker Infection screenings  HIV: recommended screening at least once age 42-65, more often as needed. Gonorrhea/Chlamydia: screening as needed Hepatitis C: recommended once for everyone age 17-75 TB: certain at-risk populations, or depending on work requirements and/or travel history Other Bone Density Test: recommended for women at age 54

## 2021-05-28 NOTE — Progress Notes (Signed)
Toni Harmon is a 45 y.o. female who presents to  [facility] at [facility]  today, 05/28/21, seeking care for the following:  Annual physical  Refill meds      ASSESSMENT & PLAN with other pertinent findings:  The primary encounter diagnosis was Annual physical exam. Diagnoses of Moderate persistent asthma, unspecified whether complicated, Asthma, chronic, severe persistent, uncomplicated, Other hyperlipidemia, and Iron deficiency anemia, unspecified iron deficiency anemia type were also pertinent to this visit.   Overall doing well Discussed vaccines, patient has had some significant reactions in the past   Patient Instructions  General Preventive Care Most recent routine screening labs: ordered.  Blood pressure goal 130/80 or less.  Tobacco: don't!  Alcohol: responsible moderation is ok for most adults - if you have concerns about your alcohol intake, please talk to me!  Exercise: as tolerated to reduce risk of cardiovascular disease and diabetes. Strength training will also prevent osteoporosis.  Mental health: if need for mental health care (medicines, counseling, other), or concerns about moods, please let me know!  Sexual / Reproductive health: if need for STD testing, or if concerns with libido/pain problems, please let me know!  Advanced Directive: Living Will and/or Healthcare Power of Attorney recommended for all adults, regardless of age or health.  Vaccines Flu vaccine: for almost everyone, every fall.  Shingles vaccine: after age 49.  Pneumonia vaccines: after age 7. Tetanus booster: every 10 years  COVID vaccine: STRONGLY RECOMMENDED  Cancer screenings  Colon cancer screening: for everyone age 31 Breast cancer screening: mammogram every year or every other year Cervical cancer screening: Pap  per OBGYN.  Lung cancer screening: NOT NEEDED for non-smoker Infection screenings  HIV: recommended screening at least  once age 68-65, more often as needed. Gonorrhea/Chlamydia: screening as needed Hepatitis C: recommended once for everyone age 59-75 TB: certain at-risk populations, or depending on work requirements and/or travel history Other Bone Density Test: recommended for women at age 65  Orders Placed This Encounter  Procedures   CBC   COMPLETE METABOLIC PANEL WITH GFR   Lipid panel   TSH   Fe+TIBC+Fer    Meds ordered this encounter  Medications   albuterol (VENTOLIN HFA) 108 (90 Base) MCG/ACT inhaler    Sig: INHALE 2 PUFFS BY MOUTH EVERY 4 HOURS AS NEEDED FOR WHEEZING - VENTOLIN DAW (PT SYMPTOMS BETTER CONTROLLED W/ BRAND NAME)    Dispense:  18 each    Refill:  PRN   escitalopram (LEXAPRO) 10 MG tablet    Sig: Take 1 tablet (10 mg total) by mouth daily.    Dispense:  90 tablet    Refill:  3   hydrochlorothiazide (HYDRODIURIL) 25 MG tablet    Sig: Take 1 tablet (25 mg total) by mouth daily.    Dispense:  90 tablet    Refill:  3   montelukast (SINGULAIR) 10 MG tablet    Sig: TAKE 1 TABLET BY MOUTH EVERYDAY AT BEDTIME    Dispense:  90 tablet    Refill:  3   EPINEPHrine (EPIPEN 2-PAK) 0.3 mg/0.3 mL IJ SOAJ injection    Sig: Inject 0.3 mg into the muscle as needed for anaphylaxis.    Dispense:  1 each    Refill:  99     See below for relevant physical exam findings  See below for recent lab and imaging results reviewed  Medications, allergies, PMH, PSH, SocH, FamH reviewed below    Follow-up instructions: Return  in about 1 year (around 05/28/2022) for ROUTINE ANNUAL PHYSICAL (OR SOONER IF NEEDED).  Patient was advised that I will be leaving practice, I think she would do well with either Joy or with Dr. Ashley Royalty, or can wait and see how things are looking with my official replacement.  Okay with me to refill meds as long as she is stable but this may be at the discretion of covering provider after  September.                                        Exam:  BP 118/73 (BP Location: Left Arm, Patient Position: Sitting, Cuff Size: Normal)   Pulse 78   Temp 97.9 F (36.6 C) (Oral)   Wt 153 lb 1.9 oz (69.5 kg)   BMI 24.71 kg/m  Constitutional: VS see above. General Appearance: alert, well-developed, well-nourished, NAD Neck: No masses, trachea midline.  Respiratory: Normal respiratory effort. no wheeze, no rhonchi, no rales Cardiovascular: S1/S2 normal, no murmur, no rub/gallop auscultated. RRR.  Musculoskeletal: Gait normal. Symmetric and independent movement of all extremities Abdominal: non-tender, non-distended, no appreciable organomegaly, neg Murphy's, BS [id number] Neurological: Normal balance/coordination. No tremor. Skin: warm, dry, intact.  Psychiatric: Normal judgment/insight. Normal mood and affect. Oriented x3.   Current Meds  Medication Sig   albuterol (PROVENTIL) (2.5 MG/3ML) 0.083% nebulizer solution TAKE 3 MLS (2.5 MG TOTAL) BY NEBULIZATION EVERY 6 (SIX) HOURS AS NEEDED FOR WHEEZING OR SHORTNESS OF BREATH.   AMBULATORY NON FORMULARY MEDICATION Nebulizer with necessary accessories.  Dx: Moderate Persistent Asthma   budesonide (RHINOCORT AQUA) 32 MCG/ACT nasal spray Place 2 sprays into both nostrils daily.   cetirizine HCl (ZYRTEC) 5 MG/5ML SOLN Take 2.5 mg by mouth daily. Patient takes  1/4 teaspoon at bedtime.   Choriogonadotropin Alfa 250 MCG/0.5ML injection INJECT (1 PREFILLED SYRINGE) SUBCUTANEOUSLY ONCE AS INSTRUCTED BY FERTILITY CLINIC   ibuprofen (ADVIL,MOTRIN) 600 MG tablet 2 (two) times daily as needed.   ipratropium (ATROVENT) 0.06 % nasal spray 2 SPRAYS INTO BOTH NOSTRILS 4 TIMES DAILY. AS NEEDED FOR SINUS OR EAR CONGESTION / POSTNASAL DRIP   loratadine (CLARITIN) 10 MG tablet Take 1 tablet (10 mg total) by mouth daily.   NON FORMULARY daily.   Potassium 99 MG TABS Take by mouth daily.   predniSONE (DELTASONE) 20 MG  tablet Take 1 tablet (20 mg total) by mouth daily with breakfast.   Prenatal Vit-Fe Fumarate-FA (PRENATAL MULTIVITAMIN) TABS tablet Take 1 tablet by mouth daily at 12 noon.   Spacer/Aero-Holding Chambers (AEROCHAMBER PLUS) inhaler Use as instructed   vitamin C (ASCORBIC ACID) 500 MG tablet Take 500 mg by mouth daily.   WIXELA INHUB 500-50 MCG/ACT AEPB INHALE 1 PUFF BY MOUTH TWICE A DAY   [DISCONTINUED] albuterol (VENTOLIN HFA) 108 (90 Base) MCG/ACT inhaler INHALE 2 PUFFS BY MOUTH EVERY 4 HOURS AS NEEDED FOR WHEEZING   [DISCONTINUED] EPINEPHrine (EPIPEN 2-PAK) 0.3 mg/0.3 mL IJ SOAJ injection Inject 0.3 mLs (0.3 mg total) into the muscle as needed for anaphylaxis.   [DISCONTINUED] escitalopram (LEXAPRO) 10 MG tablet TAKE 1 TABLET (10 MG TOTAL) BY MOUTH DAILY. APPT FOR FURTHER REFILLS   [DISCONTINUED] hydrochlorothiazide (HYDRODIURIL) 25 MG tablet TAKE 1 TABLET BY MOUTH EVERY DAY   [DISCONTINUED] montelukast (SINGULAIR) 10 MG tablet TAKE 1 TABLET BY MOUTH EVERYDAY AT BEDTIME    Allergies  Allergen Reactions   Compazine [Prochlorperazine Edisylate] Other (See Comments)  seizure   Corn-Containing Products Hives, Diarrhea and Other (See Comments)   Peanut-Containing Drug Products Anaphylaxis    Other reaction(s): Lip swelling   Ppd [Tuberculin Purified Protein Derivative] Hives and Swelling    Patient stated,"can't receive the injection anymore per MD order. Must get the xray."   Sesame Oil Hives   Soybean Oil Anaphylaxis   Eggs Or Egg-Derived Products Nausea And Vomiting   Other Hives and Other (See Comments)    Tree nuts, melon, gluten intolerance Other reaction(s): Lip swelling   Shellfish Allergy Swelling   Dust Mite Extract Hives   Food    Mixed Ragweed Hives   Molds & Smuts Hives   Peanut Oil Swelling   Pollen Extract Hives   Prochlorperazine Other (See Comments)   Tetanus Toxoids Other (See Comments)    Per patient - she gets an adverse reaction to the vaccine.    Latex Hives  and Rash    Patient Active Problem List   Diagnosis Date Noted   Allergic reaction to food 07/20/2020   Asthma, chronic, severe persistent, uncomplicated 04/25/2020   Abnormal findings in stool 02/05/2020   Lung nodule < 6cm on CT 08/12/2018   HLD (hyperlipidemia) 12/28/2017   Family history of cervical cancer 12/25/2017   Vitamin D deficiency 09/02/2016   Iron deficiency anemia 09/02/2016   Essential hypertension 11/16/2012   Moderate persistent asthma 11/16/2012   Degeneration of cervical intervertebral disc 11/16/2012   Allergic rhinitis 11/16/2012   IBS (irritable bowel syndrome) 11/16/2012   Anxiety and depression 11/16/2012   Food allergy, peanut 08/18/2012   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  No results found.     All questions at time of visit were answered - patient instructed to contact office with any additional concerns or updates. ER/RTC precautions were reviewed with the patient as applicable.   Please note: manual typing as well as voice recognition software may have been used to produce this document - typos may escape review. Please contact Dr. Lyn Hollingshead for any needed clarifications.

## 2021-07-16 DIAGNOSIS — F419 Anxiety disorder, unspecified: Secondary | ICD-10-CM | POA: Diagnosis not present

## 2021-07-16 DIAGNOSIS — Z0189 Encounter for other specified special examinations: Secondary | ICD-10-CM | POA: Diagnosis not present

## 2021-07-16 DIAGNOSIS — J45909 Unspecified asthma, uncomplicated: Secondary | ICD-10-CM | POA: Diagnosis not present

## 2021-07-23 ENCOUNTER — Other Ambulatory Visit: Payer: Self-pay | Admitting: Osteopathic Medicine

## 2021-07-23 DIAGNOSIS — J4550 Severe persistent asthma, uncomplicated: Secondary | ICD-10-CM

## 2021-08-05 DIAGNOSIS — B2790 Infectious mononucleosis, unspecified without complication: Secondary | ICD-10-CM | POA: Diagnosis not present

## 2021-08-05 DIAGNOSIS — E039 Hypothyroidism, unspecified: Secondary | ICD-10-CM | POA: Diagnosis not present

## 2021-08-05 DIAGNOSIS — K589 Irritable bowel syndrome without diarrhea: Secondary | ICD-10-CM | POA: Diagnosis not present

## 2021-08-05 DIAGNOSIS — R5383 Other fatigue: Secondary | ICD-10-CM | POA: Diagnosis not present

## 2021-08-09 ENCOUNTER — Other Ambulatory Visit: Payer: Self-pay

## 2021-08-09 ENCOUNTER — Ambulatory Visit: Payer: Self-pay

## 2021-08-09 ENCOUNTER — Encounter: Payer: Self-pay | Admitting: Emergency Medicine

## 2021-08-09 ENCOUNTER — Emergency Department
Admission: EM | Admit: 2021-08-09 | Discharge: 2021-08-09 | Disposition: A | Discharge status: Home / Self Care | Payer: Federal, State, Local not specified - PPO | Source: Home / Self Care

## 2021-08-09 DIAGNOSIS — J0100 Acute maxillary sinusitis, unspecified: Secondary | ICD-10-CM | POA: Diagnosis not present

## 2021-08-09 DIAGNOSIS — J309 Allergic rhinitis, unspecified: Secondary | ICD-10-CM | POA: Diagnosis not present

## 2021-08-09 DIAGNOSIS — J3489 Other specified disorders of nose and nasal sinuses: Secondary | ICD-10-CM | POA: Diagnosis not present

## 2021-08-09 DIAGNOSIS — Z3141 Encounter for fertility testing: Secondary | ICD-10-CM | POA: Diagnosis not present

## 2021-08-09 MED ORDER — FEXOFENADINE HCL 180 MG PO TABS: 180.0000 mg | ORAL_TABLET | Freq: Every day | ORAL | 0 refills | Status: DC

## 2021-08-09 MED ORDER — CEFDINIR 300 MG PO CAPS: 300.0000 mg | ORAL_CAPSULE | Freq: Two times a day (BID) | ORAL | 0 refills | Status: AC

## 2021-08-09 MED ORDER — PREDNISONE 20 MG PO TABS: ORAL_TABLET | ORAL | 0 refills | Status: DC

## 2021-08-09 NOTE — ED Provider Notes (Signed)
[facility]    CSN: [id number] Arrival date & time: 08/09/21  1057      History   Chief Complaint Chief Complaint  Patient presents with   Facial Pain    HPI Toni Harmon is a 45 y.o. female.   HPI 45 year old female presents with sinus nasal congestion and teeth pain since for 3-4 days.  Patient is not vaccinated for COVID-19.  Past Medical History:  Diagnosis Date   Asthma    GERD (gastroesophageal reflux disease)    Hypertension    only to combat the effects of hctz    Patient Active Problem List   Diagnosis Date Noted   Allergic reaction to food 07/20/2020   Asthma, chronic, severe persistent, uncomplicated 04/25/2020   Abnormal findings in stool 02/05/2020   Lung nodule < 6cm on CT 08/12/2018   HLD (hyperlipidemia) 12/28/2017   Family history of cervical cancer 12/25/2017   Vitamin D deficiency 09/02/2016   Iron deficiency anemia 09/02/2016   Essential hypertension 11/16/2012   Moderate persistent asthma 11/16/2012   Degeneration of cervical intervertebral disc 11/16/2012   Allergic rhinitis 11/16/2012   IBS (irritable bowel syndrome) 11/16/2012   Anxiety and depression 11/16/2012   Food allergy, peanut 08/18/2012   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 Authorizing Provider  albuterol (PROVENTIL) (2.5 MG/3ML) 0.083% nebulizer solution TAKE 3 MLS (2.5 MG TOTAL) BY NEBULIZATION EVERY 6 (SIX) HOURS AS NEEDED FOR WHEEZING OR SHORTNESS OF BREATH. 03/07/20  Yes [name], DO  albuterol (VENTOLIN HFA) 108 (90  Base) MCG/ACT inhaler INHALE 2 PUFFS BY MOUTH EVERY 4 HOURS AS NEEDED FOR WHEEZING - VENTOLIN DAW (PT SYMPTOMS BETTER CONTROLLED W/ BRAND NAME) 05/28/21  Yes [name], DO  AMBULATORY NON FORMULARY MEDICATION Nebulizer with necessary accessories.  Dx: Moderate Persistent Asthma 10/26/13  Yes Hommel, Sean, DO  budesonide (RHINOCORT AQUA) 32 MCG/ACT nasal spray Place 2 sprays into both nostrils daily. 04/25/19  Yes [name], DO  cefdinir (OMNICEF) 300 MG capsule Take 1 capsule (300 mg total) by mouth 2 (two) times daily for 7 days. 08/09/21 08/16/21 Yes Trevor Iha, FNP  cetirizine HCl (ZYRTEC) 5 MG/5ML SOLN Take 2.5 mg by mouth daily. Patient takes  1/4 teaspoon at bedtime.   Yes [provider]  Choriogonadotropin Alfa 250 MCG/0.5ML injection INJECT (1 PREFILLED SYRINGE) SUBCUTANEOUSLY ONCE AS INSTRUCTED BY FERTILITY CLINIC 09/13/20  Yes [provider]  EPINEPHrine (EPIPEN 2-PAK) 0.3 mg/0.3 mL IJ SOAJ injection Inject 0.3 mg into the muscle as needed for anaphylaxis. 05/28/21  Yes [name], DO  escitalopram (LEXAPRO) 10 MG tablet Take 1 tablet (10 mg total) by mouth daily. 05/28/21  Yes [name], DO  fexofenadine [facility] ALLERGY) 180 MG tablet Take 1 tablet (  180 mg total) by mouth daily for 15 days. 08/09/21 08/24/21 Yes Trevor Iha, FNP  hydrochlorothiazide (HYDRODIURIL) 25 MG tablet Take 1 tablet (25 mg total) by mouth daily. 05/28/21  Yes [name], DO  ibuprofen (ADVIL,MOTRIN) 600 MG tablet 2 (two) times daily as needed. 12/10/16  Yes [provider]  ipratropium (ATROVENT) 0.06 % nasal spray 2 SPRAYS INTO BOTH NOSTRILS 4 TIMES DAILY. AS NEEDED FOR SINUS OR EAR CONGESTION / POSTNASAL DRIP 07/23/21  Yes Agapito Games, MD  loratadine (CLARITIN) 10 MG tablet Take 1 tablet (10 mg total) by mouth daily. 11/08/15  Yes Hommel, Sean, DO  montelukast (SINGULAIR) 10 MG tablet TAKE 1 TABLET BY MOUTH EVERYDAY AT BEDTIME 05/28/21   Yes [name], DO  NON FORMULARY daily.   Yes [provider]  Potassium 99 MG TABS Take by mouth daily.   Yes [provider]  predniSONE (DELTASONE) 20 MG tablet Take 3 tabs PO daily x 5 days. 08/09/21  Yes Trevor Iha, FNP  Prenatal Vit-Fe Fumarate-FA (PRENATAL MULTIVITAMIN) TABS tablet Take 1 tablet by mouth daily at 12 noon.   Yes [provider]  Spacer/Aero-Holding Chambers (AEROCHAMBER PLUS) inhaler Use as instructed 01/16/20  Yes [name], DO  vitamin C (ASCORBIC ACID) 500 MG tablet Take 500 mg by mouth daily.   Yes [provider]  [name] 500-50 MCG/ACT AEPB TAKE 1 PUFF BY MOUTH TWICE A DAY 07/23/21  Yes Agapito Games, MD    Family History Family History  Problem Relation Age of Onset   Cervical cancer Mother 11   Hypertension Mother    Food Allergy Mother    Cancer Mother    Diabetes Maternal Grandfather     Social History Social History   Tobacco Use   Smoking status: Never   Smokeless tobacco: Never  Vaping Use   Vaping Use: Never used  Substance Use Topics   Alcohol use: No    Alcohol/week: 0.0 standard drinks   Drug use: No     Allergies   Compazine [prochlorperazine edisylate], Corn-containing products, Peanut-containing drug products, Ppd [tuberculin purified protein derivative], Sesame oil, Soybean oil, Eggs or egg-derived products, Other, Shellfish allergy, Dust mite extract, Food, Mixed ragweed, Molds & smuts, Peanut oil, Pollen extract, Prochlorperazine, Tetanus toxoids, and Latex   Review of Systems Review of Systems   Physical Exam Triage Vital Signs ED Triage Vitals [08/09/21 1119]  Enc Vitals Group     BP 110/74     Pulse Rate 98     Resp      Temp 98.2 F (36.8 C)     Temp Source Oral     SpO2 98 %     Weight 130 lb (59 kg)     Height 5\' 6"  (1.676 m)     Head Circumference      Peak Flow      Pain Score 8     Pain Loc      Pain Edu?      Excl. in GC?    No data  found.  Updated Vital Signs BP 110/74 (BP Location: Left Arm)   Pulse 98   Temp 98.2 F (36.8 C) (Oral)   Ht 5\' 6"  (1.676 m)   Wt 130 lb (59 kg)   LMP 07/31/2021 (Exact Date)   SpO2 98%   BMI 20.98 kg/m     Physical Exam Vitals and nursing note reviewed.  Constitutional:      Appearance: Normal appearance.  HENT:  Head: Normocephalic and atraumatic.     Right Ear: Tympanic membrane, ear canal and external ear normal.     Left Ear: Tympanic membrane, ear canal and external ear normal.     Mouth/Throat:     Mouth: Mucous membranes are moist.     Pharynx: Oropharynx is clear.  Eyes:     Extraocular Movements: Extraocular movements intact.     Conjunctiva/sclera: Conjunctivae normal.     Pupils: Pupils are equal, round, and reactive to light.  Cardiovascular:     Rate and Rhythm: Normal rate and regular rhythm.     Pulses: Normal pulses.     Heart sounds: Normal heart sounds.  Pulmonary:     Effort: Pulmonary effort is normal.     Breath sounds: Normal breath sounds.  Musculoskeletal:        General: Normal range of motion.     Cervical back: Normal range of motion and neck supple.  Skin:    General: Skin is warm and dry.  Neurological:     General: No focal deficit present.     Mental Status: She is alert and oriented to person, place, and time.     UC Treatments / Results  Labs (all labs ordered are listed, but only abnormal results are displayed) Labs Reviewed - No data to display  EKG   Radiology No results found.  Procedures Procedures (including critical care time)  Medications Ordered in UC Medications - No data to display  Initial Impression / Assessment and Plan / UC Course  I have reviewed the triage vital signs and the nursing notes.  Pertinent labs & imaging results that were available during my care of the patient were reviewed by me and considered in my medical decision making (see chart for details).     MDM: 1.  Subacute  maxillary sinusitis-Rx'd cefdinir; 2.  Sinus pressure-Rx prednisone burst; 3.  Allergic rhinitis-Rx'd Allegra. Advised patient to take medication as directed with food to completion.  Advised patient to take prednisone burst and Allegra with first dose of antibiotic for the next 5 of 7 days.  Encouraged patient increase daily water intake while taking these medications.  Work note provided prior to discharge.  Patient discharged home, hemodynamically stable. Final Clinical Impressions(s) / UC Diagnoses   Final diagnoses:  Subacute maxillary sinusitis  Sinus pressure  Allergic rhinitis, unspecified seasonality, unspecified trigger     Discharge Instructions      Advised patient to take medication as directed with food to completion.  Advised patient to take prednisone burst and Allegra with first dose of antibiotic for the next 5 of 7 days.  Encouraged patient increase daily water intake while taking these medications.  Work note provided prior to discharge.     ED Prescriptions     Medication Sig Dispense Auth. Provider   cefdinir (OMNICEF) 300 MG capsule Take 1 capsule (300 mg total) by mouth 2 (two) times daily for 7 days. 14 capsule Trevor Iha, FNP   predniSONE (DELTASONE) 20 MG tablet Take 3 tabs PO daily x 5 days. 15 tablet Trevor Iha, FNP   fexofenadine [facility] ALLERGY) 180 MG tablet Take 1 tablet (180 mg total) by mouth daily for 15 days. 15 tablet Trevor Iha, FNP      PDMP not reviewed this encounter.   Trevor Iha, FNP 08/09/21 1157

## 2021-08-09 NOTE — ED Triage Notes (Signed)
Patient c/o possible sinus infection, a lot of sinus pressure, teeth discomfort, left ear pain since Tuesday.  Patient has been taking Ibuprofen, Sudafed and Afrin Nasal Spray.  Patient is not vaccinated for COVID.

## 2021-08-09 NOTE — Discharge Instructions (Addendum)
Advised patient to take medication as directed with food to completion.  Advised patient to take prednisone burst and Allegra with first dose of antibiotic for the next 5 of 7 days.  Encouraged patient increase daily water intake while taking these medications.  Work note provided prior to discharge.

## 2021-08-13 DIAGNOSIS — Z3189 Encounter for other procreative management: Secondary | ICD-10-CM | POA: Diagnosis not present

## 2021-08-28 DIAGNOSIS — Z3200 Encounter for pregnancy test, result unknown: Secondary | ICD-10-CM | POA: Diagnosis not present

## 2021-09-03 ENCOUNTER — Encounter: Payer: Self-pay | Admitting: Family Medicine

## 2021-09-03 ENCOUNTER — Ambulatory Visit: Payer: Federal, State, Local not specified - PPO | Admitting: Family Medicine

## 2021-09-03 ENCOUNTER — Other Ambulatory Visit: Payer: Self-pay

## 2021-09-03 VITALS — BP 118/84 | HR 86 | Temp 97.6°F | Wt 137.0 lb

## 2021-09-03 DIAGNOSIS — J4540 Moderate persistent asthma, uncomplicated: Secondary | ICD-10-CM

## 2021-09-03 NOTE — Progress Notes (Signed)
Established Patient Office Visit  Subjective:  Patient ID: Toni Harmon, female    DOB: 12/04/1975  Age: 45 y.o. MRN: [id number]  CC:  Chief Complaint  Patient presents with   paperwork     HPI Toni Harmon presents for [facility] paperwork updates.  Patient reports her employer is asking for updated FMLA paperwork related to her asthma since the pandemic began. States that she is doing well overall. Recently got over a URI and asthma exacerbation for which she went to urgent care for. Feeling much better. States that at baseline she has to use her albuterol inhaler at least twice a day at work - states she works in a very dusty environment. She continues to have several flares per year. States she has not seen a specialist since moving to [place] about 10 years ago.   Today she denies any wheezing, chest pain, shortness of breath, fevers, URI symptoms.    Past Medical History:  Diagnosis Date   Asthma    GERD (gastroesophageal reflux disease)    Hypertension    only to combat the effects of hctz    Past Surgical History:  Procedure Laterality Date   DILATION AND CURETTAGE OF UTERUS  12/2011   TONSILLECTOMY     TOOTH EXTRACTION      Family History  Problem Relation Age of Onset   Cervical cancer Mother 18   Hypertension Mother    Food Allergy Mother    Cancer Mother    Diabetes Maternal Grandfather     Social History   Socioeconomic History   Marital status: Married    Spouse name: Not on file   Number of children: Not on file   Years of education: Not on file   Highest education level: Not on file  Occupational History   Not on file  Tobacco Use   Smoking status: Never   Smokeless tobacco: Never  Vaping Use   Vaping Use: Never used  Substance and Sexual Activity   Alcohol use: No    Alcohol/week: 0.0 standard drinks   Drug use: No   Sexual activity: Yes    Partners: Male    Birth control/protection: None  Other Topics Concern   Not on file   Social History Narrative   Not on file   Social Determinants of Health   Financial Resource Strain: Not on file  Food Insecurity: Not on file  Transportation Needs: Not on file  Physical Activity: Not on file  Stress: Not on file  Social Connections: Not on file  Intimate Partner Violence: Not on file    Outpatient Medications Prior to Visit  Medication Sig Dispense Refill   albuterol (PROVENTIL) (2.5 MG/3ML) 0.083% nebulizer solution TAKE 3 MLS (2.5 MG TOTAL) BY NEBULIZATION EVERY 6 (SIX) HOURS AS NEEDED FOR WHEEZING OR SHORTNESS OF BREATH. 150 mL 1   albuterol (VENTOLIN HFA) 108 (90 Base) MCG/ACT inhaler INHALE 2 PUFFS BY MOUTH EVERY 4 HOURS AS NEEDED FOR WHEEZING - VENTOLIN DAW (PT SYMPTOMS BETTER CONTROLLED W/ BRAND NAME) 18 each PRN   AMBULATORY NON FORMULARY MEDICATION Nebulizer with necessary accessories.  Dx: Moderate Persistent Asthma 1 Units 0   budesonide (RHINOCORT AQUA) 32 MCG/ACT nasal spray Place 2 sprays into both nostrils daily. 8.6 g 11   cetirizine HCl (ZYRTEC) 5 MG/5ML SOLN Take 2.5 mg by mouth daily. Patient takes  1/4 teaspoon at bedtime.     Choriogonadotropin Alfa 250 MCG/0.5ML injection INJECT (1 PREFILLED SYRINGE) SUBCUTANEOUSLY ONCE AS  INSTRUCTED BY FERTILITY CLINIC     EPINEPHrine (EPIPEN 2-PAK) 0.3 mg/0.3 mL IJ SOAJ injection Inject 0.3 mg into the muscle as needed for anaphylaxis. 1 each 99   escitalopram (LEXAPRO) 10 MG tablet Take 1 tablet (10 mg total) by mouth daily. 90 tablet 3   hydrochlorothiazide (HYDRODIURIL) 25 MG tablet Take 1 tablet (25 mg total) by mouth daily. 90 tablet 3   ibuprofen (ADVIL,MOTRIN) 600 MG tablet 2 (two) times daily as needed.  0   ipratropium (ATROVENT) 0.06 % nasal spray 2 SPRAYS INTO BOTH NOSTRILS 4 TIMES DAILY. AS NEEDED FOR SINUS OR EAR CONGESTION / POSTNASAL DRIP 15 mL 1   loratadine (CLARITIN) 10 MG tablet Take 1 tablet (10 mg total) by mouth daily. 90 tablet 1   montelukast (SINGULAIR) 10 MG tablet TAKE 1 TABLET  BY MOUTH EVERYDAY AT BEDTIME 90 tablet 3   NON FORMULARY daily.     Potassium 99 MG TABS Take by mouth daily.     predniSONE (DELTASONE) 20 MG tablet Take 3 tabs PO daily x 5 days. 15 tablet 0   Prenatal Vit-Fe Fumarate-FA (PRENATAL MULTIVITAMIN) TABS tablet Take 1 tablet by mouth daily at 12 noon.     Spacer/Aero-Holding Chambers (AEROCHAMBER PLUS) inhaler Use as instructed 1 each 2   vitamin C (ASCORBIC ACID) 500 MG tablet Take 500 mg by mouth daily.     WIXELA INHUB 500-50 MCG/ACT AEPB TAKE 1 PUFF BY MOUTH TWICE A DAY 60 each 1   fexofenadine (ALLEGRA ALLERGY) 180 MG tablet Take 1 tablet (180 mg total) by mouth daily for 15 days. 15 tablet 0   No facility-administered medications prior to visit.    Allergies  Allergen Reactions   Compazine [Prochlorperazine Edisylate] Other (See Comments)    seizure   Corn-Containing Products Hives, Diarrhea and Other (See Comments)   Peanut-Containing Drug Products Anaphylaxis    Other reaction(s): Lip swelling   Penicillins Anaphylaxis   Ppd [Tuberculin Purified Protein Derivative] Hives and Swelling    Patient stated,"can't receive the injection anymore per MD order. Must get the xray."   Sesame Oil Hives   Soybean Oil Anaphylaxis   Eggs Or Egg-Derived Products Nausea And Vomiting and Hives   Nitrous Oxide Nausea And Vomiting   Other Hives and Other (See Comments)    Tree nuts, melon, gluten intolerance Other reaction(s): Lip swelling   Shellfish Allergy Swelling   Dust Mite Extract Hives   Food    Mixed Ragweed Hives   Molds & Smuts Hives   Peanut Oil Swelling   Pollen Extract Hives   Prochlorperazine Other (See Comments)   Tetanus Toxoids Other (See Comments)    Per patient - she gets an adverse reaction to the vaccine.    Latex Hives and Rash    ROS Review of Systems All review of systems negative except what is listed in the HPI    Objective:    Physical Exam Vitals reviewed.  Constitutional:      Appearance: Normal  appearance.  HENT:     Head: Normocephalic and atraumatic.  Cardiovascular:     Rate and Rhythm: Normal rate and regular rhythm.     Pulses: Normal pulses.     Heart sounds: Normal heart sounds.  Pulmonary:     Effort: Pulmonary effort is normal.     Breath sounds: Normal breath sounds.  Skin:    Capillary Refill: Capillary refill takes less than 2 seconds.  Neurological:     General: No focal  deficit present.     Mental Status: She is alert and oriented to person, place, and time. Mental status is at baseline.  Psychiatric:        Mood and Affect: Mood normal.        Behavior: Behavior normal.        Thought Content: Thought content normal.        Judgment: Judgment normal.    BP 118/84 (BP Location: Left Arm, Patient Position: Sitting, Cuff Size: Normal)   Pulse 86   Temp 97.6 F (36.4 C) (Oral)   Wt 137 lb (62.1 kg)   SpO2 100%   BMI 22.11 kg/m  Wt Readings from Last 3 Encounters:  09/03/21 137 lb (62.1 kg)  08/09/21 130 lb (59 kg)  05/28/21 153 lb 1.9 oz (69.5 kg)     Health Maintenance Due  Topic Date Due   COVID-19 Vaccine (1) Never done   TETANUS/TDAP  Never done   PAP SMEAR-Modifier  03/18/2018    There are no preventive care reminders to display for this patient.  Lab Results  Component Value Date   TSH 2.15 10/18/2020   Lab Results  Component Value Date   WBC 8.3 10/18/2020   HGB 13.1 10/18/2020   HCT 39.1 10/18/2020   MCV 90.1 10/18/2020   PLT 343 10/18/2020   Lab Results  Component Value Date   NA 140 10/18/2020   K 4.7 10/18/2020   CO2 28 10/18/2020   GLUCOSE 72 10/18/2020   BUN 24 10/18/2020   CREATININE 0.93 10/18/2020   BILITOT 0.3 10/18/2020   ALKPHOS 52 12/09/2016   AST 19 10/18/2020   ALT 28 10/18/2020   PROT 6.9 10/18/2020   ALBUMIN 4.2 12/09/2016   CALCIUM 10.7 (H) 10/18/2020   ANIONGAP 8 03/16/2016   Lab Results  Component Value Date   CHOL 336 (H) 02/02/2020   Lab Results  Component Value Date   HDL 79 02/02/2020    Lab Results  Component Value Date   LDLCALC 234 (H) 02/02/2020   Lab Results  Component Value Date   TRIG 103 02/02/2020   Lab Results  Component Value Date   CHOLHDL 4.3 02/02/2020   No results found for: HGBA1C    Assessment & Plan:    1. Moderate persistent asthma without complication FMLA paperwork updated.  Referral placed to Asthma and Allergy office.  Patient aware of signs/symptoms requiring further/urgent evaluation.   - Ambulatory referral to Allergy  Follow-up as previously scheduled or sooner if needed.   Clayborne Dana, NP

## 2021-09-22 ENCOUNTER — Other Ambulatory Visit: Payer: Self-pay | Admitting: Family Medicine

## 2021-09-22 DIAGNOSIS — J4550 Severe persistent asthma, uncomplicated: Secondary | ICD-10-CM

## 2021-09-25 ENCOUNTER — Ambulatory Visit: Payer: Federal, State, Local not specified - PPO | Admitting: Medical-Surgical

## 2021-10-03 ENCOUNTER — Encounter: Payer: Self-pay | Admitting: Medical-Surgical

## 2021-10-03 ENCOUNTER — Ambulatory Visit: Payer: Federal, State, Local not specified - PPO | Admitting: Medical-Surgical

## 2021-10-03 ENCOUNTER — Other Ambulatory Visit: Payer: Self-pay

## 2021-10-03 VITALS — BP 108/74 | HR 85 | Resp 20 | Ht 66.0 in | Wt 143.6 lb

## 2021-10-03 DIAGNOSIS — K588 Other irritable bowel syndrome: Secondary | ICD-10-CM

## 2021-10-03 DIAGNOSIS — J4550 Severe persistent asthma, uncomplicated: Secondary | ICD-10-CM | POA: Diagnosis not present

## 2021-10-03 DIAGNOSIS — I10 Essential (primary) hypertension: Secondary | ICD-10-CM | POA: Diagnosis not present

## 2021-10-03 DIAGNOSIS — F419 Anxiety disorder, unspecified: Secondary | ICD-10-CM

## 2021-10-03 DIAGNOSIS — D509 Iron deficiency anemia, unspecified: Secondary | ICD-10-CM | POA: Diagnosis not present

## 2021-10-03 DIAGNOSIS — J4540 Moderate persistent asthma, uncomplicated: Secondary | ICD-10-CM

## 2021-10-03 DIAGNOSIS — D508 Other iron deficiency anemias: Secondary | ICD-10-CM

## 2021-10-03 DIAGNOSIS — E7849 Other hyperlipidemia: Secondary | ICD-10-CM | POA: Diagnosis not present

## 2021-10-03 DIAGNOSIS — Z7689 Persons encountering health services in other specified circumstances: Secondary | ICD-10-CM | POA: Diagnosis not present

## 2021-10-03 DIAGNOSIS — F32A Depression, unspecified: Secondary | ICD-10-CM

## 2021-10-03 MED ORDER — PREDNISONE 20 MG PO TABS: ORAL_TABLET | ORAL | 0 refills | Status: DC

## 2021-10-03 NOTE — Progress Notes (Signed)
°  HPI with pertinent ROS:   CC: transfer of care  HPI: Pleasant 45 year old female presenting today to transfer care to a new PCP and follow up on:  Asthma- diagnosed 19 years ago. Using Albuterol nebs/inhalers, sometimes twice daily depending on her environment and seasonal changes. Taking Cetirizine at night but has to take a reduced dose due to sedation. Taking Claritin 10mg  and Singulair 10mg  every morning. Using Rentz as prescribed. Notes that her prior PCP provided her with a prescription for Prednisone that had refills so she could just use the medication whenever flares happened without having to wait for an appointment. Estimates using Prednisone about 6 times in the past year for flares. Was referred to Asthma and Allergy but needs to call them back to get scheduled for an appointment.   Mood- taking Lexapro 10mg  daily, tolerating well without side effects. Feels this is keeping her mood fairly stable.   Fertility- is seeing a fertility specialist as they are trying to conceive. Having bloodwork done regularly there.   HTN- taking HCTZ 25mg  daily, tolerating well without side effects. Denies CP, SOB, palpitations, lower extremity edema, dizziness, headaches, or vision changes.  I reviewed the past medical history, family history, social history, surgical history, and allergies today and no changes were needed.  Please see the problem list section below in epic for further details.   Physical exam:   General: Well Developed, well nourished, and in no acute distress.  Neuro: Alert and oriented x3.  HEENT: Normocephalic, atraumatic.  Skin: Warm and dry. Cardiac: Regular rate and rhythm, no murmurs rubs or gallops, no lower extremity edema.  Respiratory: Clear to auscultation bilaterally. Not using accessory muscles, speaking in full sentences.  Impression and Recommendations:    1. Encounter to establish care Reviewed available information and discussed care concerns with  patient.   2. Anxiety and depression Stable. Continue Lexapro 10mg  daily.   3. Essential hypertension At goal. Continue HCTZ 25mg  daily.   4. Moderate persistent asthma without complication Continue Albuterol nebs/inhaler and Wixela. Continue Claritin, Zyrtec, and Singulair. Discussed the risk of frequent use of systemic steroids.  Burst for 5 days provided today without refills. Will need to establish with Asthma and Allergy for further recommendations and management. Discussed Trelegy as a possible treatment but it looks like this is not covered by her insurance.   5. Other irritable bowel syndrome Stable.   6. Other hyperlipidemia Checking labs.  7. Iron deficiency anemia secondary to inadequate dietary iron intake Checking labs.   Return in about 6 months (around 04/03/2022) for chronic disease follow up. ___________________________________________ [employer], DNP, APRN, FNP-BC Primary Care and Sports Medicine [facility] [place]

## 2021-10-04 LAB — COMPLETE METABOLIC PANEL WITH GFR
AG Ratio: 2.2 (calc) (ref 1.0–2.5)
ALT: 20 U/L (ref 6–29)
AST: 17 U/L (ref 10–30)
Albumin: 4.4 g/dL (ref 3.6–5.1)
Alkaline phosphatase (APISO): 45 U/L (ref 31–125)
BUN: 9 mg/dL (ref 7–25)
CO2: 32 mmol/L (ref 20–32)
Calcium: 10.1 mg/dL (ref 8.6–10.2)
Chloride: 105 mmol/L (ref 98–110)
Creat: 0.67 mg/dL (ref 0.50–0.99)
Globulin: 2 g/dL (calc) (ref 1.9–3.7)
Glucose, Bld: 72 mg/dL (ref 65–99)
Potassium: 4.5 mmol/L (ref 3.5–5.3)
Sodium: 143 mmol/L (ref 135–146)
Total Bilirubin: 0.3 mg/dL (ref 0.2–1.2)
Total Protein: 6.4 g/dL (ref 6.1–8.1)
eGFR: 110 mL/min/{1.73_m2} (ref >=60)

## 2021-10-04 LAB — LIPID PANEL
Cholesterol: 224 mg/dL — ABNORMAL HIGH (ref <200)
HDL: 100 mg/dL (ref >=50)
LDL Cholesterol (Calc): 111 mg/dL (calc) — ABNORMAL HIGH
Non-HDL Cholesterol (Calc): 124 mg/dL (calc) (ref <130)
Total CHOL/HDL Ratio: 2.2 (calc) (ref <5.0)
Triglycerides: 50 mg/dL (ref <150)

## 2021-10-04 LAB — IRON,TIBC AND FERRITIN PANEL
%SAT: 27 % (calc) (ref 16–45)
Ferritin: 147 ng/mL (ref 16–232)
Iron: 91 ug/dL (ref 40–190)
TIBC: 338 mcg/dL (calc) (ref 250–450)

## 2021-10-04 LAB — CBC
HCT: 37.5 % (ref 35.0–45.0)
Hemoglobin: 12.3 g/dL (ref 11.7–15.5)
MCH: 29.9 pg (ref 27.0–33.0)
MCHC: 32.8 g/dL (ref 32.0–36.0)
MCV: 91.2 fL (ref 80.0–100.0)
MPV: 9 fL (ref 7.5–12.5)
Platelets: 353 10*3/uL (ref 140–400)
RBC: 4.11 10*6/uL (ref 3.80–5.10)
RDW: 12.9 % (ref 11.0–15.0)
WBC: 4.4 10*3/uL (ref 3.8–10.8)

## 2021-10-04 LAB — TSH: TSH: 1.45 mIU/L

## 2021-10-04 NOTE — Progress Notes (Signed)
Joy is PCP, forwarding to her.

## 2021-11-11 DIAGNOSIS — E039 Hypothyroidism, unspecified: Secondary | ICD-10-CM | POA: Diagnosis not present

## 2021-11-11 DIAGNOSIS — R5383 Other fatigue: Secondary | ICD-10-CM | POA: Diagnosis not present

## 2021-11-11 DIAGNOSIS — D509 Iron deficiency anemia, unspecified: Secondary | ICD-10-CM | POA: Diagnosis not present

## 2021-11-11 DIAGNOSIS — N943 Premenstrual tension syndrome: Secondary | ICD-10-CM | POA: Diagnosis not present

## 2021-11-11 DIAGNOSIS — E538 Deficiency of other specified B group vitamins: Secondary | ICD-10-CM | POA: Diagnosis not present

## 2021-11-11 DIAGNOSIS — E559 Vitamin D deficiency, unspecified: Secondary | ICD-10-CM | POA: Diagnosis not present

## 2021-11-11 DIAGNOSIS — Z1321 Encounter for screening for nutritional disorder: Secondary | ICD-10-CM | POA: Diagnosis not present

## 2021-11-11 DIAGNOSIS — Z789 Other specified health status: Secondary | ICD-10-CM | POA: Diagnosis not present

## 2021-11-17 ENCOUNTER — Other Ambulatory Visit: Payer: Self-pay | Admitting: Medical-Surgical

## 2021-11-17 DIAGNOSIS — J4550 Severe persistent asthma, uncomplicated: Secondary | ICD-10-CM

## 2021-11-27 DIAGNOSIS — B379 Candidiasis, unspecified: Secondary | ICD-10-CM | POA: Diagnosis not present

## 2021-11-27 DIAGNOSIS — K589 Irritable bowel syndrome without diarrhea: Secondary | ICD-10-CM | POA: Diagnosis not present

## 2021-11-27 DIAGNOSIS — R5383 Other fatigue: Secondary | ICD-10-CM | POA: Diagnosis not present

## 2021-11-27 DIAGNOSIS — E039 Hypothyroidism, unspecified: Secondary | ICD-10-CM | POA: Diagnosis not present

## 2021-12-16 DIAGNOSIS — N979 Female infertility, unspecified: Secondary | ICD-10-CM | POA: Diagnosis not present

## 2021-12-16 DIAGNOSIS — J45909 Unspecified asthma, uncomplicated: Secondary | ICD-10-CM | POA: Diagnosis not present

## 2021-12-16 DIAGNOSIS — Z0189 Encounter for other specified special examinations: Secondary | ICD-10-CM | POA: Diagnosis not present

## 2021-12-16 DIAGNOSIS — F419 Anxiety disorder, unspecified: Secondary | ICD-10-CM | POA: Diagnosis not present

## 2021-12-28 ENCOUNTER — Other Ambulatory Visit: Payer: Self-pay

## 2021-12-28 ENCOUNTER — Emergency Department
Admission: RE | Admit: 2021-12-28 | Discharge: 2021-12-28 | Disposition: A | Discharge status: Home / Self Care | Payer: Federal, State, Local not specified - PPO | Source: Ambulatory Visit

## 2021-12-28 VITALS — BP 109/75 | HR 85 | Temp 98.8°F | Resp 20 | Ht 66.0 in | Wt 150.0 lb

## 2021-12-28 DIAGNOSIS — R059 Cough, unspecified: Secondary | ICD-10-CM

## 2021-12-28 MED ORDER — METHYLPREDNISOLONE 4 MG PO TBPK: ORAL_TABLET | ORAL | 0 refills | Status: DC

## 2021-12-28 NOTE — ED Triage Notes (Signed)
Pt presents to Urgent Care with c/o cough w/ wheezing x 6 days. Reports she is having an asthma flare-up which she has every spring and fall. Denies fever or other respiratory s/s.  ?

## 2021-12-28 NOTE — Discharge Instructions (Addendum)
Advised patient to take medication as directed with food to completion.  Encouraged patient to increase daily water intake while taking this medication.  Advised patient if symptoms worsen and/or unresolved please follow-up with PCP or here for further evaluation. 

## 2021-12-28 NOTE — ED Provider Notes (Signed)
?[facility] ? ? ? ?CSN: [id number] ?Arrival date & time: 12/28/21  0848 ? ? ?  ? ?History   ?Chief Complaint ?Chief Complaint  ?Patient presents with  ? Wheezing  ? Cough  ? ? ?HPI ?Toni Harmon is a 46 y.o. female.  ? ?HPI 46 year old female presents, with cough and wheezing for 6 days.  PMH significant for asthma chronic, severe persistent, uncomplicated.  Patient reports having asthma flareups this time of spring each year.  Patient is currently prescribed DuoNeb inhaler daily as needed, Wixela, Singulair, albuterol, cetirizine and/or loratadine for current asthma therapy. ? ?Past Medical History:  ?Diagnosis Date  ? Asthma   ? GERD (gastroesophageal reflux disease)   ? Hypertension   ? only to combat the effects of hctz  ? ? ?Patient Active Problem List  ? Diagnosis Date Noted  ? Allergic reaction to food 07/20/2020  ? Asthma, chronic, severe persistent, uncomplicated 04/25/2020  ? Abnormal findings in stool 02/05/2020  ? Lung nodule < 6cm on CT 08/12/2018  ? HLD (hyperlipidemia) 12/28/2017  ? Family history of cervical cancer 12/25/2017  ? Vitamin D deficiency 09/02/2016  ? Iron deficiency anemia 09/02/2016  ? Essential hypertension 11/16/2012  ? Moderate persistent asthma 11/16/2012  ? Degeneration of cervical intervertebral disc 11/16/2012  ? Allergic rhinitis 11/16/2012  ? IBS (irritable bowel syndrome) 11/16/2012  ? Anxiety and depression 11/16/2012  ? Food allergy, peanut 08/18/2012  ? 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 ? ?Past Surgical History:  ?Procedure Laterality Date  ? DILATION AND CURETTAGE OF UTERUS  12/2011  ? TONSILLECTOMY    ? TOOTH EXTRACTION    ? ? ?OB History   ? ? Gravida  ?5  ? Para  ?   ? Term  ?   ? Preterm  ?   ? AB  ?4  ? Living  ?0  ?  ? ? SAB  ?3  ? IAB  ?1  ? Ectopic  ?   ? Multiple  ?   ? Live Births  ?   ?   ?  ?  ? ? ? ?Home Medications   ? ?Prior to Admission medications   ?Medication Sig Start Date End Date Taking? Authorizing Provider   ?methylPREDNISolone (MEDROL DOSEPAK) 4 MG TBPK tablet Take as directed 12/28/21  Yes Trevor Iha, FNP  ?albuterol (PROVENTIL) (2.5 MG/3ML) 0.083% nebulizer solution TAKE 3 MLS (2.5 MG TOTAL) BY NEBULIZATION EVERY 6 (SIX) HOURS AS NEEDED FOR WHEEZING OR SHORTNESS OF BREATH. 03/07/20   [name], DO  ?albuterol (VENTOLIN HFA) 108 (90 Base) MCG/ACT inhaler INHALE 2 PUFFS BY MOUTH EVERY 4 HOURS AS NEEDED FOR WHEEZING - VENTOLIN DAW (PT SYMPTOMS BETTER CONTROLLED W/ BRAND NAME) 05/28/21   [name], DO  ?AMBULATORY NON FORMULARY MEDICATION Nebulizer with necessary accessories.  Dx: Moderate Persistent Asthma 10/26/13   Laren Boom, DO  ?budesonide (RHINOCORT AQUA) 32 MCG/ACT nasal spray Place 2 sprays into both nostrils daily. 04/25/19   [name], DO  ?cetirizine HCl (ZYRTEC) 5 MG/5ML SOLN Take 2.5 mg by mouth daily. Patient takes  1/4 teaspoon at bedtime.    [provider]  ?Choriogonadotropin Alfa 250 MCG/0.5ML injection INJECT (1 PREFILLED SYRINGE) SUBCUTANEOUSLY ONCE AS INSTRUCTED BY FERTILITY CLINIC 09/13/20   [provider]  ?EPINEPHrine (EPIPEN 2-PAK) 0.3 mg/0.3 mL IJ SOAJ injection Inject 0.3 mg into the muscle as needed for anaphylaxis. 05/28/21   [name], DO  ?escitalopram (LEXAPRO) 10 MG tablet Take  1 tablet (10 mg total) by mouth daily. 05/28/21   [name], DO  ?fexofenadine [facility] ALLERGY) 180 MG tablet Take 1 tablet (180 mg total) by mouth daily for 15 days. 08/09/21 08/24/21  Trevor Iha, FNP  ?hydrochlorothiazide (HYDRODIURIL) 25 MG tablet Take 1 tablet (25 mg total) by mouth daily. 05/28/21   [name], DO  ?ibuprofen (ADVIL,MOTRIN) 600 MG tablet 2 (two) times daily as needed. 12/10/16   [provider]  ?ipratropium (ATROVENT) 0.06 % nasal spray 2 SPRAYS INTO BOTH NOSTRILS 4 TIMES DAILY AS NEEDED FOR SINUS OR EAR CONGESTION / POSTNASAL DRIP 11/18/21   [name], NP  ?loratadine (CLARITIN) 10 MG tablet Take 1  tablet (10 mg total) by mouth daily. 11/08/15   Laren Boom, DO  ?montelukast (SINGULAIR) 10 MG tablet TAKE 1 TABLET BY MOUTH EVERYDAY AT BEDTIME 05/28/21   [name], DO  ?NON FORMULARY daily.    [provider]  ?Potassium 99 MG TABS Take by mouth daily.    [provider]  ?Prenatal Vit-Fe Fumarate-FA (PRENATAL MULTIVITAMIN) TABS tablet Take 1 tablet by mouth daily at 12 noon.    [provider]  ?Spacer/Aero-Holding Chambers (AEROCHAMBER PLUS) inhaler Use as instructed 01/16/20   [name], DO  ?vitamin C (ASCORBIC ACID) 500 MG tablet Take 500 mg by mouth daily.    [provider]  ?[name] 500-50 MCG/ACT AEPB TAKE 1 PUFF BY MOUTH TWICE A DAY 11/18/21   [name], NP  ? ? ?Family History ?Family History  ?Problem Relation Age of Onset  ? Cervical cancer Mother 42  ? Hypertension Mother   ? Food Allergy Mother   ? Cancer Mother   ? Diabetes Maternal Grandfather   ? ? ?Social History ?Social History  ? ?Tobacco Use  ? Smoking status: Never  ? Smokeless tobacco: Never  ?Vaping Use  ? Vaping Use: Never used  ?Substance Use Topics  ? Alcohol use: No  ?  Alcohol/week: 0.0 standard drinks  ? Drug use: No  ? ? ? ?Allergies   ?Compazine [prochlorperazine edisylate], Corn-containing products, Peanut-containing drug products, Penicillins, Ppd [tuberculin purified protein derivative], Sesame oil, Soybean oil, Eggs or egg-derived products, Nitrous oxide, Other, Shellfish allergy, Dust mite extract, Food, Mixed ragweed, Molds & smuts, Peanut oil, Pollen extract, Prochlorperazine, Tetanus toxoids, and Latex ? ? ?Review of Systems ?Review of Systems  ?Respiratory:  Positive for cough and wheezing.   ?All other systems reviewed and are negative. ? ? ?Physical Exam ?Triage Vital Signs ?ED Triage Vitals  ?Enc Vitals Group  ?   BP 12/28/21 0910 109/75  ?   Pulse Rate 12/28/21 0910 85  ?   Resp 12/28/21 0910 20  ?   Temp 12/28/21 0910 98.8 ?F (37.1 ?C)  ?   Temp Source 12/28/21  0910 Oral  ?   SpO2 12/28/21 0910 99 %  ?   Weight 12/28/21 0905 150 lb (68 kg)  ?   Height 12/28/21 0905 5\' 6"  (1.676 m)  ?   Head Circumference --   ?   Peak Flow --   ?   Pain Score 12/28/21 0905 0  ?   Pain Loc --   ?   Pain Edu? --   ?   Excl. in GC? --   ? ?No data found. ? ?Updated Vital Signs ?BP 109/75 (BP Location: Right Arm)   Pulse 85   Temp 98.8 ?F (37.1 ?C) (Oral)   Resp 20   Ht 5\' 6"  (  1.676 m)   Wt 150 lb (68 kg)   LMP 12/11/2021 (Exact Date)   SpO2 99%   BMI 24.21 kg/m?  ? ?   ? ?Physical Exam ?Vitals and nursing note reviewed.  ?Constitutional:   ?   Appearance: Normal appearance. She is normal weight.  ?HENT:  ?   Head: Normocephalic and atraumatic.  ?   Right Ear: Tympanic membrane and external ear normal.  ?   Left Ear: Tympanic membrane and external ear normal.  ?   Ears:  ?   Comments: Moderate eustachian tube dysfunction noted bilaterally ?   Nose: Nose normal.  ?   Mouth/Throat:  ?   Mouth: Mucous membranes are moist.  ?   Pharynx: Oropharynx is clear.  ?   Comments: Mild amount of clear drainage of posterior oropharynx noted ?Eyes:  ?   Extraocular Movements: Extraocular movements intact.  ?   Conjunctiva/sclera: Conjunctivae normal.  ?   Pupils: Pupils are equal, round, and reactive to light.  ?Cardiovascular:  ?   Rate and Rhythm: Normal rate and regular rhythm.  ?   Pulses: Normal pulses.  ?   Heart sounds: Normal heart sounds.  ?Pulmonary:  ?   Effort: Pulmonary effort is normal.  ?   Breath sounds: Wheezing present.  ?   Comments: Mild subtle wheezes noted over right middle and bases ?Musculoskeletal:  ?   Cervical back: Normal range of motion and neck supple.  ?Skin: ?   General: Skin is warm and dry.  ?Neurological:  ?   General: No focal deficit present.  ?   Mental Status: She is alert and oriented to person, place, and time.  ? ? ? ?UC Treatments / Results  ?Labs ?(all labs ordered are listed, but only abnormal results are displayed) ?Labs Reviewed - No data to  display ? ?EKG ? ? ?Radiology ?No results found. ? ?Procedures ?Procedures (including critical care time) ? ?Medications Ordered in UC ?Medications - No data to display ? ?Initial Impression / Assessment and Plan / UC Course  ?I have rev

## 2022-01-05 NOTE — Progress Notes (Addendum)
?  HPI with pertinent ROS:  ? ?CC: urgent care follow up ? ?HPI: ?Pleasant 46 year old female presenting for follow up from an urgent care visit on 3/25 for an asthma exacerbation. She was prescribed a Medrol Dosepak and instructed to continue Wixela, Albuterol inhaler/nebulizer prn, daily antihistamine, and Singulair as prescribed. She finished the steroid pack but notes that it did not fully improve her symptoms. She is still experiencing chest tightness and some shortness of breath. Did an albuterol nebulizer treatment before her appointment. Has a form for completion for [employer].  ? ?I reviewed the past medical history, family history, social history, surgical history, and allergies today and no changes were needed.  Please see the problem list section below in epic for further details. ? ? ?Physical exam:  ? ?General: Well Developed, well nourished, and in no acute distress.  ?Neuro: Alert and oriented x3.  ?HEENT: Normocephalic, atraumatic.  ?Skin: Warm and dry. ?Cardiac: Regular rate and rhythm, no murmurs rubs or gallops, no lower extremity edema.  ?Respiratory: Clear to auscultation bilaterally however diminished in bilateral lower lobes posteriorly. Not using accessory muscles, speaking in full sentences. ? ?Impression and Recommendations:   ? ?1. Moderate persistent asthma with acute exacerbation ?Refilling albuterol inhaler and nebulizer solution. Continues to have symptoms. Will do a 5 day burst of Decadron with hopes for full resolution. If not improved at that point, may need a chest x-ray and referral to Pulmonology. Form completed and returned to patient. Copy made for our records.  ?- albuterol (PROVENTIL) (2.5 MG/3ML) 0.083% nebulizer solution; Take 3 mLs (2.5 mg total) by nebulization every 6 (six) hours as needed for wheezing or shortness of breath.  Dispense: 150 mL; Refill: 1 ?- albuterol (VENTOLIN HFA) 108 (90 Base) MCG/ACT inhaler; INHALE 2 PUFFS BY MOUTH EVERY 4 HOURS AS  NEEDED FOR WHEEZING - VENTOLIN DAW (PT SYMPTOMS BETTER CONTROLLED W/ BRAND NAME)  Dispense: 18 each; Refill: PRN ?- dexamethasone (DECADRON) 4 MG tablet; Take 1 tablet (4 mg total) by mouth 2 (two) times daily with a meal for 5 days.  Dispense: 10 tablet; Refill: 0 ? ?Return if symptoms worsen or fail to improve. ?___________________________________________ ?Thayer Ohm, DNP, APRN, FNP-BC ?Primary Care and Sports Medicine ? [name] ?

## 2022-01-06 ENCOUNTER — Ambulatory Visit: Payer: Federal, State, Local not specified - PPO | Admitting: Medical-Surgical

## 2022-01-06 ENCOUNTER — Encounter: Payer: Self-pay | Admitting: Medical-Surgical

## 2022-01-06 VITALS — BP 109/75 | HR 82 | Resp 20 | Ht 66.0 in | Wt 162.2 lb

## 2022-01-06 DIAGNOSIS — J4540 Moderate persistent asthma, uncomplicated: Secondary | ICD-10-CM

## 2022-01-06 DIAGNOSIS — J4541 Moderate persistent asthma with (acute) exacerbation: Secondary | ICD-10-CM

## 2022-01-06 MED ORDER — ALBUTEROL SULFATE (2.5 MG/3ML) 0.083% IN NEBU: 2.5000 mg | INHALATION_SOLUTION | Freq: Four times a day (QID) | RESPIRATORY_TRACT | 1 refills | Status: DC | PRN

## 2022-01-06 MED ORDER — DEXAMETHASONE 4 MG PO TABS: 4.0000 mg | ORAL_TABLET | Freq: Two times a day (BID) | ORAL | 0 refills | Status: AC

## 2022-01-06 MED ORDER — ALBUTEROL SULFATE HFA 108 (90 BASE) MCG/ACT IN AERS: INHALATION_SPRAY | RESPIRATORY_TRACT | 99 refills | Status: DC

## 2022-01-08 ENCOUNTER — Encounter: Payer: Self-pay | Admitting: Medical-Surgical

## 2022-01-20 ENCOUNTER — Other Ambulatory Visit: Payer: Self-pay | Admitting: Medical-Surgical

## 2022-01-20 DIAGNOSIS — J4550 Severe persistent asthma, uncomplicated: Secondary | ICD-10-CM

## 2022-01-27 DIAGNOSIS — E289 Ovarian dysfunction, unspecified: Secondary | ICD-10-CM | POA: Diagnosis not present

## 2022-01-30 DIAGNOSIS — Z1329 Encounter for screening for other suspected endocrine disorder: Secondary | ICD-10-CM | POA: Diagnosis not present

## 2022-01-30 DIAGNOSIS — N979 Female infertility, unspecified: Secondary | ICD-10-CM | POA: Diagnosis not present

## 2022-01-30 DIAGNOSIS — Z01812 Encounter for preprocedural laboratory examination: Secondary | ICD-10-CM | POA: Diagnosis not present

## 2022-01-30 DIAGNOSIS — N925 Other specified irregular menstruation: Secondary | ICD-10-CM | POA: Diagnosis not present

## 2022-02-06 DIAGNOSIS — N979 Female infertility, unspecified: Secondary | ICD-10-CM | POA: Diagnosis not present

## 2022-03-25 DIAGNOSIS — N979 Female infertility, unspecified: Secondary | ICD-10-CM | POA: Diagnosis not present

## 2022-03-26 ENCOUNTER — Other Ambulatory Visit: Payer: Self-pay | Admitting: Medical-Surgical

## 2022-03-26 DIAGNOSIS — J4550 Severe persistent asthma, uncomplicated: Secondary | ICD-10-CM

## 2022-03-28 DIAGNOSIS — K219 Gastro-esophageal reflux disease without esophagitis: Secondary | ICD-10-CM | POA: Insufficient documentation

## 2022-04-02 ENCOUNTER — Encounter: Payer: Self-pay | Admitting: Medical-Surgical

## 2022-04-02 ENCOUNTER — Ambulatory Visit: Payer: Federal, State, Local not specified - PPO | Admitting: Medical-Surgical

## 2022-04-02 VITALS — BP 108/63 | HR 76 | Resp 20 | Ht 66.0 in | Wt 160.3 lb

## 2022-04-02 DIAGNOSIS — K219 Gastro-esophageal reflux disease without esophagitis: Secondary | ICD-10-CM

## 2022-04-02 DIAGNOSIS — D508 Other iron deficiency anemias: Secondary | ICD-10-CM

## 2022-04-02 DIAGNOSIS — N979 Female infertility, unspecified: Secondary | ICD-10-CM | POA: Diagnosis not present

## 2022-04-02 DIAGNOSIS — I10 Essential (primary) hypertension: Secondary | ICD-10-CM

## 2022-04-02 DIAGNOSIS — E7849 Other hyperlipidemia: Secondary | ICD-10-CM

## 2022-04-02 DIAGNOSIS — E559 Vitamin D deficiency, unspecified: Secondary | ICD-10-CM | POA: Diagnosis not present

## 2022-04-02 DIAGNOSIS — J4550 Severe persistent asthma, uncomplicated: Secondary | ICD-10-CM

## 2022-04-02 DIAGNOSIS — F419 Anxiety disorder, unspecified: Secondary | ICD-10-CM

## 2022-04-02 DIAGNOSIS — F32A Depression, unspecified: Secondary | ICD-10-CM

## 2022-04-02 DIAGNOSIS — Z1211 Encounter for screening for malignant neoplasm of colon: Secondary | ICD-10-CM

## 2022-04-02 LAB — IRON,TIBC AND FERRITIN PANEL
%SAT: 18 % (calc) (ref 16–45)
Ferritin: 89 ng/mL (ref 16–232)
Iron: 65 ug/dL (ref 40–190)
TIBC: 356 mcg/dL (calc) (ref 250–450)

## 2022-04-02 LAB — COMPLETE METABOLIC PANEL WITH GFR
AG Ratio: 2 (calc) (ref 1.0–2.5)
ALT: 12 U/L (ref 6–29)
AST: 14 U/L (ref 10–35)
Albumin: 4.5 g/dL (ref 3.6–5.1)
Alkaline phosphatase (APISO): 51 U/L (ref 31–125)
BUN: 16 mg/dL (ref 7–25)
CO2: 29 mmol/L (ref 20–32)
Calcium: 9.9 mg/dL (ref 8.6–10.2)
Chloride: 102 mmol/L (ref 98–110)
Creat: 0.63 mg/dL (ref 0.50–0.99)
Globulin: 2.2 g/dL (calc) (ref 1.9–3.7)
Glucose, Bld: 78 mg/dL (ref 65–99)
Potassium: 4.7 mmol/L (ref 3.5–5.3)
Sodium: 139 mmol/L (ref 135–146)
Total Bilirubin: 0.3 mg/dL (ref 0.2–1.2)
Total Protein: 6.7 g/dL (ref 6.1–8.1)
eGFR: 111 mL/min/{1.73_m2} (ref >=60)

## 2022-04-02 LAB — CBC WITH DIFFERENTIAL/PLATELET
Absolute Monocytes: 378 cells/uL (ref 200–950)
Basophils Absolute: 22 cells/uL (ref 0–200)
Basophils Relative: 0.5 %
Eosinophils Absolute: 70 cells/uL (ref 15–500)
Eosinophils Relative: 1.6 %
HCT: 38.1 % (ref 35.0–45.0)
Hemoglobin: 12.5 g/dL (ref 11.7–15.5)
Lymphs Abs: 1725 cells/uL (ref 850–3900)
MCH: 29 pg (ref 27.0–33.0)
MCHC: 32.8 g/dL (ref 32.0–36.0)
MCV: 88.4 fL (ref 80.0–100.0)
MPV: 8.4 fL (ref 7.5–12.5)
Monocytes Relative: 8.6 %
Neutro Abs: 2204 cells/uL (ref 1500–7800)
Neutrophils Relative %: 50.1 %
Platelets: 403 10*3/uL — ABNORMAL HIGH (ref 140–400)
RBC: 4.31 10*6/uL (ref 3.80–5.10)
RDW: 12.7 % (ref 11.0–15.0)
Total Lymphocyte: 39.2 %
WBC: 4.4 10*3/uL (ref 3.8–10.8)

## 2022-04-02 LAB — LIPID PANEL
Cholesterol: 257 mg/dL — ABNORMAL HIGH (ref <200)
HDL: 89 mg/dL (ref >=50)
LDL Cholesterol (Calc): 153 mg/dL (calc) — ABNORMAL HIGH
Non-HDL Cholesterol (Calc): 168 mg/dL (calc) — ABNORMAL HIGH (ref <130)
Total CHOL/HDL Ratio: 2.9 (calc) (ref <5.0)
Triglycerides: 55 mg/dL (ref <150)

## 2022-04-02 LAB — VITAMIN D 25 HYDROXY (VIT D DEFICIENCY, FRACTURES): Vit D, 25-Hydroxy: 80 ng/mL (ref 30–100)

## 2022-04-02 LAB — THYROID PANEL WITH TSH
Free Thyroxine Index: 2.9 (ref 1.4–3.8)
T3 Uptake: 31 % (ref 22–35)
T4, Total: 9.4 ug/dL (ref 5.1–11.9)
TSH: 2.71 mIU/L

## 2022-04-02 NOTE — Progress Notes (Signed)
Established Patient Office Visit  Subjective   Patient ID: Toni Harmon, female   DOB: [birth year] Age: 46 y.o. MRN: [id number]   Chief Complaint  Patient presents with   Follow-up    HPI Pleasant 46 year old female presenting today for the following:  Hypertension: only checks BP if symptomatic. Taking HCTZ 25mg  daily. Denies CP, worsened SOB, palpitations, lower extremity edema, dizziness, headaches, or vision changes.  Mood: taking Lexapro 10mg  daily, tolerating well without side effects. Feels that it's working fairly well but still having some issues with symptoms but they are situational   Hyperlipidemia: Not currently taking a prescribed medication for cholesterol management.    Iron deficiency: taking oral Gentle Iron TID and a prenatal vitamin daily.  This does cause some GI upset and she wonders how her iron has responded.  Vitamin D deficiency: Taking a prenatal vitamin daily which provides vitamin D.  GERD: taking famotidine 20mg  BID, tolerating well without side effects. Working well.   Asthma: has been having a hard time with although a recent allergens and smoke in the environment.  She has several different inhalers and nebulizers and uses Zyrtec, Singulair, and Wixela as prescribed.  Fertility: has gone to and . They started her on NP Thyroid, not liking this. Has gained weight since starting this medication but has had no other significant side effects.   Objective:    Vitals:   04/02/22 0937  BP: 108/63  Pulse: 76  Resp: 20  Height: 5\' 6"  (1.676 m)  Weight: 160 lb 4.8 oz (72.7 kg)  SpO2: 100%  BMI (Calculated): 25.89    Physical Exam Vitals and nursing note reviewed.  Constitutional:      General: She is not in acute distress.    Appearance: Normal appearance. She is not ill-appearing.  HENT:     Head: Normocephalic and atraumatic.  Cardiovascular:     Rate and Rhythm: Normal rate and regular rhythm.      Pulses: Normal pulses.  Pulmonary:     Effort: Pulmonary effort is normal. No respiratory distress.  Skin:    General: Skin is warm and dry.  Neurological:     Mental Status: She is alert and oriented to person, place, and time.  Psychiatric:        Mood and Affect: Mood normal.        Behavior: Behavior normal.        Thought Content: Thought content normal.        Judgment: Judgment normal.   No results found for this or any previous visit (from the past 24 hour(s)).     The 10-year ASCVD risk score (Arnett DK, et al., 2019) is: 0.4%*   Values used to calculate the score:     Age: 79 years     Sex: Female     Is Non-Hispanic African American: Yes     Diabetic: No     Tobacco smoker: No     Systolic Blood Pressure: 108 mmHg     Is BP treated: Yes     HDL Cholesterol: 92 mg/dL*     Total Cholesterol: 219 mg/dL*     * - Cholesterol units were assumed for this score calculation   Assessment & Plan:   1. Essential hypertension Checking labs as below.  Blood pressure is looking great today although on the lower side of normal.  Recommend that she use HCTZ 25 mg daily on an as-needed basis with a goal of maintaining  her blood pressure at 130/80 or less. - CBC with Differential/Platelet - COMPLETE METABOLIC PANEL WITH GFR - Lipid panel  2. Asthma, chronic, severe persistent, uncomplicated Continue albuterol, Singulair, Wixela, and Zyrtec as prescribed.  3. Gastroesophageal reflux disease, unspecified whether esophagitis present Continue famotidine 20 mg twice daily.  4. Anxiety and depression Continue Lexapro 10 mg daily.  Notes that her symptoms are not greatly controlled however a lot of this is situational and surrounding her fertility efforts.  5. Other hyperlipidemia Checking lipid panel.  6. Iron deficiency anemia secondary to inadequate dietary iron intake Checking iron levels.  Continue oral supplementation. - Fe+TIBC+Fer  7. Vitamin D deficiency Checking  vitamin D. - VITAMIN D 25 Hydroxy (Vit-D Deficiency, Fractures)  8. Infertility, female With recent weight gain and starting NP thyroid, we will go ahead and check a thyroid panel with TSH per patient request. Further fertility work-up and management with chronic conception. - Thyroid Panel With TSH   Return in about 6 months (around 10/02/2022) for chronic disease follow up.  ___________________________________________ Thayer Ohm, DNP, APRN, FNP-BC Primary Care and Sports Medicine [facility]

## 2022-04-11 ENCOUNTER — Other Ambulatory Visit: Payer: Self-pay | Admitting: Osteopathic Medicine

## 2022-05-19 ENCOUNTER — Ambulatory Visit
Admission: EM | Admit: 2022-05-19 | Discharge: 2022-05-19 | Disposition: A | Discharge status: Home / Self Care | Payer: Federal, State, Local not specified - PPO | Attending: Family Medicine | Admitting: Family Medicine

## 2022-05-19 ENCOUNTER — Ambulatory Visit: Payer: Self-pay

## 2022-05-19 DIAGNOSIS — J4531 Mild persistent asthma with (acute) exacerbation: Secondary | ICD-10-CM

## 2022-05-19 DIAGNOSIS — F419 Anxiety disorder, unspecified: Secondary | ICD-10-CM | POA: Diagnosis not present

## 2022-05-19 MED ORDER — PREDNISONE 20 MG PO TABS
ORAL_TABLET | ORAL | 0 refills | Status: DC
Start: 2022-05-19 — End: 2022-10-02

## 2022-05-19 MED ORDER — EPINEPHRINE 0.3 MG/0.3ML IJ SOAJ: 0.3000 mg | INTRAMUSCULAR | 1 refills | Status: DC | PRN

## 2022-05-19 NOTE — ED Triage Notes (Signed)
Pt presents with wheezing and SOB that began last week. Pt states sx are causing panic attacks.

## 2022-05-19 NOTE — ED Provider Notes (Signed)
[facility]    CSN: [id number] Arrival date & time: 05/19/22  1725      History   Chief Complaint Chief Complaint  Patient presents with   Asthma    HPI Toni Harmon is a 46 y.o. female.   Patient complains of several recurring asthma attacks during the past week.  She becomes anxious when wheezing and shortness of breath develop, leading to panic attacks.  She believes that her anxiety may now be precipitating her asthma flares.  Her wheezing and shortness of breath improve with use of her albuterol MDI, but not the anxiety.  She has multiple food, drug, and environmental allergies.  She is assymptomatic at present.  She notes that cycles of asthma exacerbation have improved in the past with prednisone.  The history is provided by the patient and the spouse.    Past Medical History:  Diagnosis Date   Asthma    GERD (gastroesophageal reflux disease)    Hypertension    only to combat the effects of hctz    Patient Active Problem List   Diagnosis Date Noted   GERD (gastroesophageal reflux disease) 03/28/2022   Allergic reaction to food 07/20/2020   Asthma, chronic, severe persistent, uncomplicated 04/25/2020   Abnormal findings in stool 02/05/2020   Lung nodule < 6cm on CT 08/12/2018   HLD (hyperlipidemia) 12/28/2017   Family history of cervical cancer 12/25/2017   Vitamin D deficiency 09/02/2016   Iron deficiency anemia 09/02/2016   Essential hypertension 11/16/2012   Moderate persistent asthma 11/16/2012   Degeneration of cervical intervertebral disc 11/16/2012   Allergic rhinitis 11/16/2012   IBS (irritable bowel syndrome) 11/16/2012   Anxiety and depression 11/16/2012   Food allergy, peanut 08/18/2012   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 Authorizing Provider  predniSONE (DELTASONE) 20 MG tablet Take one tab by mouth twice daily for 4 days, then one daily. Take with food. 05/19/22  Yes [name], MD  albuterol (PROVENTIL) (2.5 MG/3ML) 0.083% nebulizer solution Take 3 mLs (2.5 mg total) by nebulization every 6 (six) hours as needed for wheezing or shortness of breath. 01/06/22   [name], NP  albuterol (VENTOLIN HFA) 108 (90 Base) MCG/ACT inhaler INHALE 2 PUFFS BY MOUTH EVERY 4 HOURS AS NEEDED FOR WHEEZING - VENTOLIN DAW (PT SYMPTOMS BETTER CONTROLLED W/ BRAND NAME) 01/06/22   [name], NP  AMBULATORY NON FORMULARY MEDICATION Nebulizer with necessary accessories.  Dx: Moderate Persistent Asthma 10/26/13   Hommel, Sean, DO  budesonide (RHINOCORT AQUA) 32 MCG/ACT nasal spray Place 2 sprays into both nostrils daily. 04/25/19   [name], DO  cetirizine HCl (ZYRTEC) 5 MG/5ML SOLN Take 2.5 mg by mouth daily. Patient takes  1/4 teaspoon at bedtime.  [provider]  Choriogonadotropin Alfa 250 MCG/0.5ML injection INJECT (1 PREFILLED SYRINGE) SUBCUTANEOUSLY ONCE AS INSTRUCTED BY FERTILITY CLINIC Patient not taking: Reported on 05/19/2022 09/13/20   [provider]  EPINEPHrine (EPIPEN 2-PAK) 0.3 mg/0.3 mL IJ SOAJ injection Inject 0.3 mg into the muscle as needed for anaphylaxis. 05/19/22   [name], MD  escitalopram (LEXAPRO) 10 MG tablet Take 1 tablet (10 mg total) by mouth daily. 05/28/21   [name], DO  hydrochlorothiazide (HYDRODIURIL) 25 MG tablet TAKE 1 TABLET (25 MG TOTAL) BY MOUTH DAILY. 04/11/22   [name], NP  ibuprofen (ADVIL,MOTRIN) 600 MG tablet 2 (two) times daily as needed. 12/10/16   [provider]  ipratropium (ATROVENT) 0.06 % nasal  spray SPRAY 2 SPRAYS INTO EACH NOSTRIL 4X DAILY AS NEEDED FOR SINUS OR EAR CONGESTION/POSTNASAL DRIP Patient not taking: Reported on 05/19/2022 03/26/22   [name], NP  letrozole [place] [place]) 2.5 MG tablet Take by mouth. Patient not taking: Reported on 05/19/2022 03/17/22   [provider]  loratadine (CLARITIN) 10 MG tablet Take 1 tablet (10 mg total) by mouth daily. 11/08/15   Hommel, [name], DO  montelukast (SINGULAIR) 10 MG tablet TAKE 1 TABLET BY MOUTH EVERYDAY AT BEDTIME 05/28/21   [name], DO  NON FORMULARY daily.    [provider]  NP THYROID 15 MG tablet Take 45 mg by mouth every morning. 02/28/22   [provider]  NP THYROID 30 MG tablet Take 30 mg by mouth daily. 03/16/22   [provider]  Potassium 99 MG TABS Take by mouth daily.    [provider]  Prenatal Vit-Fe Fumarate-FA (PRENATAL MULTIVITAMIN) TABS tablet Take 1 tablet by mouth daily at 12 noon.    [provider]  Spacer/Aero-Holding Chambers (AEROCHAMBER PLUS) inhaler Use as instructed 01/16/20   [name], DO  vitamin C (ASCORBIC ACID) 500 MG tablet Take 500 mg by mouth daily.    [provider]  [name] 500-50 MCG/ACT AEPB TAKE 1 PUFF BY MOUTH TWICE A DAY 03/26/22   [name], NP    Family History Family History  Problem Relation Age of Onset   Cervical cancer Mother 29   Hypertension Mother    Food Allergy Mother    Cancer Mother    Diabetes Maternal Grandfather     Social History Social History   Tobacco Use   Smoking status: Never   Smokeless tobacco: Never  Vaping Use   Vaping Use: Never used  Substance Use Topics   Alcohol use: No    Alcohol/week: 0.0 standard drinks of alcohol   Drug use: No     Allergies   Compazine [prochlorperazine edisylate], Corn-containing products, Peanut-containing drug products, Penicillins, Ppd [tuberculin purified protein derivative], Sesame oil, Soybean oil, Eggs or egg-derived products,  Nitrous oxide, Other, Shellfish allergy, Dust mite extract, Food, Mixed ragweed, Molds & smuts, Peanut oil, Pollen extract, Prochlorperazine, Tetanus toxoids, and Latex   Review of Systems Review of Systems No sore throat No cough No pleuritic pain + wheezing + nasal congestion ? post-nasal drainage No sinus pain/pressure No itchy/red eyes No earache No hemoptysis + SOB No fever/chills No nausea No vomiting No abdominal pain No diarrhea No urinary symptoms No skin rash No fatigue No myalgias No headache   Physical Exam Triage Vital Signs ED Triage Vitals  Enc Vitals Group     BP 05/19/22 1736 135/88     Pulse Rate 05/19/22 1736 78     Resp  05/19/22 1736 14     Temp 05/19/22 1736 98.5 F (36.9 C)     Temp Source 05/19/22 1736 Oral     SpO2 05/19/22 1736 100 %     Weight --      Height --      Head Circumference --      Peak Flow --      Pain Score 05/19/22 1733 0     Pain Loc --      Pain Edu? --      Excl. in [place]? --    No data found.  Updated Vital Signs BP 135/88 (BP Location: Right Arm)   Pulse 78   Temp 98.5 F (36.9 C) (Oral)   Resp 14   SpO2 100%   Visual Acuity Right Eye Distance:   Left Eye Distance:   Bilateral Distance:    Right Eye Near:   Left Eye Near:    Bilateral Near:     Physical Exam Nursing notes and Vital Signs reviewed. Appearance:  Patient appears stated age, and in no acute distress.  She is alert and oriented with euthymic mood and affect. Eyes:  Pupils are equal, round, and reactive to light and accomodation.  Extraocular movement is intact.  Conjunctivae are not inflamed  Ears:  Canals normal.  Tympanic membranes normal.  Nose:   Normal turbinates.  No sinus tenderness.  Pharynx:  Normal Neck:  Supple.  No adenopathy. Lungs:  Clear to auscultation.  Breath sounds are equal.  Moving air well. Heart:  Regular rate and rhythm without murmurs, rubs, or gallops.  Abdomen:  Nontender without masses or hepatosplenomegaly.   Bowel sounds are present.  No CVA or flank tenderness.  Extremities:  No edema.  Skin:  No rash present.   UC Treatments / Results  Labs (all labs ordered are listed, but only abnormal results are displayed) Labs Reviewed - No data to display  EKG   Radiology No results found.  Procedures Procedures (including critical care time)  Medications Ordered in UC Medications - No data to display  Initial Impression / Assessment and Plan / UC Course  I have reviewed the triage vital signs and the nursing notes.  Pertinent labs & imaging results that were available during my care of the patient were reviewed by me and considered in my medical decision making (see chart for details).    Patient is presently assymptomatic.  She appears to have an adequate asthma/allergy management plan. Suspect that her asthma flares may be precipitated by anxiety.  She is presently taking Lexapro 10mg  daily. Recommend that she follow-up with her PCP for anxiety management. Begin prednisone burst/taper. Refill Epipen at patient's request.  Final Clinical Impressions(s) / UC Diagnoses   Final diagnoses:  Mild persistent chronic asthma with acute exacerbation  Anxiety     Discharge Instructions      Continue all inhalers and Singulair as prescribed.  If symptoms become significantly worse during the night or over the weekend, proceed to the local emergency room.     ED Prescriptions     Medication Sig Dispense Auth. Provider   predniSONE (DELTASONE) 20 MG tablet Take one tab by mouth twice daily for 4 days, then one daily. Take with food. 12 tablet , MD   EPINEPHrine (EPIPEN 2-PAK) 0.3 mg/0.3 mL IJ SOAJ injection Inject 0.3 mg into the muscle as needed for anaphylaxis. 1 each [name], MD         [name]  A, MD 05/21/22 1753

## 2022-05-19 NOTE — Discharge Instructions (Signed)
Continue all inhalers and Singulair as prescribed.  If symptoms become significantly worse during the night or over the weekend, proceed to the local emergency room.

## 2022-05-20 ENCOUNTER — Ambulatory Visit: Payer: Self-pay

## 2022-05-25 ENCOUNTER — Other Ambulatory Visit: Payer: Self-pay | Admitting: Medical-Surgical

## 2022-05-25 DIAGNOSIS — J4550 Severe persistent asthma, uncomplicated: Secondary | ICD-10-CM

## 2022-07-01 ENCOUNTER — Ambulatory Visit
Admission: EM | Admit: 2022-07-01 | Discharge: 2022-07-01 | Disposition: A | Discharge status: Home / Self Care | Payer: Federal, State, Local not specified - PPO | Attending: Family Medicine | Admitting: Family Medicine

## 2022-07-01 ENCOUNTER — Encounter: Payer: Self-pay | Admitting: Emergency Medicine

## 2022-07-01 DIAGNOSIS — F411 Generalized anxiety disorder: Secondary | ICD-10-CM | POA: Diagnosis not present

## 2022-07-01 DIAGNOSIS — Z008 Encounter for other general examination: Secondary | ICD-10-CM | POA: Diagnosis not present

## 2022-07-01 MED ORDER — ESCITALOPRAM OXALATE 20 MG PO TABS: 20.0000 mg | ORAL_TABLET | Freq: Every day | ORAL | 0 refills | Status: DC

## 2022-07-01 NOTE — ED Triage Notes (Signed)
Patient states that she's been having issues with her anxiety.  Patient did make an appointment w/her PCP but can't get in until 07/10/22 to be seen.  Patient hasn't been to work since 06/22/22.  Not requesting medication she just needs to be cleared to go back to work.

## 2022-07-01 NOTE — ED Provider Notes (Signed)
[facility]    CSN: [id number] Arrival date & time: 07/01/22  1726      History   Chief Complaint Chief Complaint  Patient presents with   Anxiety    HPI GIANAH HUDLEY is a 46 y.o. female.   HPI  Patient has significant generalized anxiety.  She is under the care of her primary care doctor but also goes to the [facility].  She has been unsuccessful in getting counseling and feels like this will be helpful for her.  She states that she found herself unable to work on 06/22/2022 has been off for couple of days.  She is planning on going back to work day after tomorrow.  She is on Lexapro 10 a day.  Been on this for 2 years.  States that it helps.  I inquired whether she would be willing to go up to 20 mg a day since this is the usual adult dose.  She tolerates the 10 mg a day and I do not anticipate she would have increased side effects.  It may better help with her anxiety.  She states that she will go up to 20 mg a day pending follow-up with her PCP in 2 weeks  Past Medical History:  Diagnosis Date   Asthma    GERD (gastroesophageal reflux disease)    Hypertension    only to combat the effects of hctz    Patient Active Problem List   Diagnosis Date Noted   GERD (gastroesophageal reflux disease) 03/28/2022   Allergic reaction to food 07/20/2020   Asthma, chronic, severe persistent, uncomplicated [id number]   Abnormal findings in stool 02/05/2020   Lung nodule < 6cm on CT 08/12/2018   HLD (hyperlipidemia) 12/28/2017   Family history of cervical cancer 12/25/2017   Vitamin D deficiency 09/02/2016   Iron deficiency anemia 09/02/2016   Essential hypertension 11/16/2012   Moderate persistent asthma 11/16/2012   Degeneration of cervical intervertebral disc 11/16/2012   Allergic rhinitis 11/16/2012   IBS (irritable bowel syndrome) 11/16/2012   Anxiety and depression 11/16/2012   Food allergy, peanut 08/18/2012   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 Authorizing Provider  albuterol (PROVENTIL) (2.5 MG/3ML) 0.083% nebulizer solution Take 3 mLs (2.5 mg total) by nebulization every 6 (six) hours as needed for wheezing or shortness of breath. 01/06/22  Yes Jessup, Joy, NP  albuterol (VENTOLIN HFA) 108 (90 Base) MCG/ACT inhaler INHALE 2 PUFFS BY MOUTH EVERY 4 HOURS AS NEEDED FOR WHEEZING - VENTOLIN DAW (PT SYMPTOMS BETTER CONTROLLED W/ BRAND NAME) 01/06/22  Yes Samuel Bouche, NP  AMBULATORY NON FORMULARY MEDICATION Nebulizer with necessary accessories.  Dx: Moderate Persistent Asthma 10/26/13  Yes Hommel, Sean, DO  budesonide (RHINOCORT AQUA) 32 MCG/ACT nasal  spray Place 2 sprays into both nostrils daily. 04/25/19  Yes Emeterio Reeve, DO  cetirizine HCl (ZYRTEC) 5 MG/5ML SOLN Take 2.5 mg by mouth daily. Patient takes  1/4 teaspoon at bedtime.   Yes [provider]  Choriogonadotropin Alfa 250 MCG/0.5ML injection  09/13/20  Yes [provider]  EPINEPHrine (EPIPEN 2-PAK) 0.3 mg/0.3 mL IJ SOAJ injection Inject 0.3 mg into the muscle as needed for anaphylaxis. 05/19/22  Yes Kandra Nicolas, MD  escitalopram (LEXAPRO) 10 MG tablet Take 1 tablet (10 mg total) by mouth daily. 05/28/21  Yes Emeterio Reeve, DO  escitalopram (LEXAPRO) 20 MG tablet Take 1 tablet (20 mg total) by mouth daily. 07/01/22  Yes Raylene Everts, MD  hydrochlorothiazide (HYDRODIURIL) 25 MG tablet TAKE 1 TABLET (25 MG TOTAL) BY MOUTH DAILY. 04/11/22  Yes Samuel Bouche, NP  ibuprofen (ADVIL,MOTRIN) 600 MG tablet 2 (two) times daily as needed. 12/10/16   Yes [provider]  ipratropium (ATROVENT) 0.06 % nasal spray SPRAY 2 SPRAYS INTO EACH NOSTRIL 4X DAILY AS NEEDED FOR SINUS OR EAR CONGESTION/POSTNASAL DRIP 05/29/22  Yes Samuel Bouche, NP  letrozole ([place]) 2.5 MG tablet Take by mouth. 03/17/22  Yes [provider]  loratadine (CLARITIN) 10 MG tablet Take 1 tablet (10 mg total) by mouth daily. 11/08/15  Yes Hommel, Sean, DO  montelukast (SINGULAIR) 10 MG tablet TAKE 1 TABLET BY MOUTH EVERYDAY AT BEDTIME 05/28/21  Yes Emeterio Reeve, DO  NON FORMULARY daily.   Yes [provider]  NP THYROID 15 MG tablet Take 45 mg by mouth every morning. 02/28/22  Yes [provider]  NP THYROID 30 MG tablet Take 30 mg by mouth daily. 03/16/22  Yes [provider]  Potassium 99 MG TABS Take by mouth daily.   Yes [provider]  predniSONE (DELTASONE) 20 MG tablet Take one tab by mouth twice daily for 4 days, then one daily. Take with food. 05/19/22  Yes Kandra Nicolas, MD  Prenatal Vit-Fe Fumarate-FA (PRENATAL MULTIVITAMIN) TABS tablet Take 1 tablet by mouth daily at 12 noon.   Yes [provider]  Spacer/Aero-Holding Chambers (AEROCHAMBER PLUS) inhaler Use as instructed 01/16/20  Yes Luetta Nutting, DO  vitamin C (ASCORBIC ACID) 500 MG tablet Take 500 mg by mouth daily.   Yes [provider]  Grant Ruts INHUB 500-50 MCG/ACT AEPB INHALE 1 PUFF BY MOUTH TWICE A DAY 05/29/22  Yes Samuel Bouche, NP    Family History Family History  Problem Relation Age of Onset   Cervical cancer Mother 1   Hypertension Mother    Food Allergy Mother    Cancer Mother    Diabetes Maternal Grandfather     Social History Social History   Tobacco Use   Smoking status: Never   Smokeless tobacco: Never  Vaping Use   Vaping Use: Never used  Substance Use Topics   Alcohol use: No    Alcohol/week: 0.0 standard drinks of alcohol   Drug use: No     Allergies   Compazine [prochlorperazine edisylate],  Corn-containing products, Peanut-containing drug products, Penicillins, Ppd [tuberculin purified protein derivative], Sesame oil, Soybean oil, Eggs or egg-derived products, Nitrous oxide, Other, Shellfish allergy, Dust mite extract, Food, Mixed ragweed, Molds & smuts, Peanut oil, Pollen extract, Prochlorperazine, Tetanus toxoids, and Latex   Review of Systems Review of Systems  See HPI Physical Exam Triage Vital Signs ED Triage Vitals  Enc Vitals Group     BP 07/01/22 1735 133/75     Pulse  Rate 07/01/22 1735 94     Resp 07/01/22 1735 18     Temp 07/01/22 1735 99.1 F (37.3 C)     Temp Source 07/01/22 1735 Oral     SpO2 07/01/22 1735 100 %     Weight 07/01/22 1737 160 lb (72.6 kg)     Height 07/01/22 1737 5\' 6"  (1.676 m)     Head Circumference --      Peak Flow --      Pain Score 07/01/22 1737 0     Pain Loc --      Pain Edu? --      Excl. in [place]? --    No data found.  Updated Vital Signs BP 133/75 (BP Location: Right Arm)   Pulse 94   Temp 99.1 F (37.3 C) (Oral)   Resp 18   Ht 5\' 6"  (1.676 m)   Wt 72.6 kg   LMP 06/19/2022 (Exact Date)   SpO2 100%   BMI 25.82 kg/m      Physical Exam Constitutional:      General: She is not in acute distress.    Appearance: She is well-developed.  HENT:     Head: Normocephalic and atraumatic.  Eyes:     Conjunctiva/sclera: Conjunctivae normal.     Pupils: Pupils are equal, round, and reactive to light.  Cardiovascular:     Rate and Rhythm: Normal rate.  Pulmonary:     Effort: Pulmonary effort is normal. No respiratory distress.  Abdominal:     General: There is no distension.     Palpations: Abdomen is soft.  Musculoskeletal:        General: Normal range of motion.     Cervical back: Normal range of motion.  Skin:    General: Skin is warm and dry.  Neurological:     Mental Status: She is alert.  Psychiatric:     Comments: Mildly anxious and labile in appearance.  Lucid.  Normal conversation      UC Treatments /  Results  Labs (all labs ordered are listed, but only abnormal results are displayed) Labs Reviewed - No data to display  EKG   Radiology No results found.  Procedures Procedures (including critical care time)  Medications Ordered in UC Medications - No data to display  Initial Impression / Assessment and Plan / UC Course  I have reviewed the triage vital signs and the nursing notes.  Pertinent labs & imaging results that were available during my care of the patient were reviewed by me and considered in my medical decision making (see chart for details).    Discussed behavioral health urgent care if she does have crisis symptoms Final Clinical Impressions(s) / UC Diagnoses   Final diagnoses:  Encounter for work capability assessment  Generalized anxiety disorder     Discharge Instructions      Consider increasing Lexapro to 20 mg a day  Go to the behavioral health urgent care for crisis  Follow-up with Dr. Charna Archer   ED Prescriptions     Medication Sig [place]. Provider   escitalopram (LEXAPRO) 20 MG tablet Take 1 tablet (20 mg total) by mouth daily. 30 tablet Raylene Everts, MD      PDMP not reviewed this encounter.   Raylene Everts, MD 07/01/22 [phone]

## 2022-07-01 NOTE — Discharge Instructions (Signed)
Consider increasing Lexapro to 20 mg a day  Go to the behavioral health urgent care for crisis  Follow-up with Dr. Charna Archer

## 2022-07-13 NOTE — Progress Notes (Unsigned)
   Established Patient Office Visit  Subjective   Patient ID: Toni Harmon, female   DOB: May 30, 1976 Age: 46 y.o. MRN: [id number]   No chief complaint on file.   HPI Pleasant 46 year old female presenting today to follow up on:  Anxiety: was seen at [facility] on 9/26 with complaints of worsened GAD that caused her to miss several days from work. Was taking Lexapro 10mg  daily, tolerating well without side effects. She was instructed to increase her dose to 20mg  daily and follow up with her PCP. Today, she presents reporting that   Asthma:    Objective:    There were no vitals filed for this visit.  Physical Exam   No results found for this or any previous visit (from the past 24 hour(s)).   {Labs (Optional):23779}  The 10-year ASCVD risk score (Arnett DK, et al., 2019) is: 1.4%   Values used to calculate the score:     Age: 39 years     Sex: Female     Is Non-Hispanic African American: Yes     Diabetic: No     Tobacco smoker: No     Systolic Blood Pressure: 952 mmHg     Is BP treated: Yes     HDL Cholesterol: 89 mg/dL     Total Cholesterol: 257 mg/dL   Assessment & Plan:   No problem-specific Assessment & Plan notes found for this encounter.   No follow-ups on file.  ___________________________________________ Clearnce Sorrel, DNP, APRN, FNP-BC [facility]

## 2022-07-14 ENCOUNTER — Ambulatory Visit: Payer: Federal, State, Local not specified - PPO | Admitting: Medical-Surgical

## 2022-07-14 ENCOUNTER — Encounter: Payer: Self-pay | Admitting: Medical-Surgical

## 2022-07-14 VITALS — BP 112/74 | HR 80 | Resp 20 | Ht 66.0 in | Wt 171.9 lb

## 2022-07-14 DIAGNOSIS — Z0289 Encounter for other administrative examinations: Secondary | ICD-10-CM | POA: Diagnosis not present

## 2022-07-14 DIAGNOSIS — F32A Depression, unspecified: Secondary | ICD-10-CM | POA: Diagnosis not present

## 2022-07-14 DIAGNOSIS — F419 Anxiety disorder, unspecified: Secondary | ICD-10-CM

## 2022-07-14 DIAGNOSIS — J4550 Severe persistent asthma, uncomplicated: Secondary | ICD-10-CM

## 2022-07-14 MED ORDER — ESCITALOPRAM OXALATE 20 MG PO TABS: 20.0000 mg | ORAL_TABLET | Freq: Every day | ORAL | 0 refills | Status: DC

## 2022-07-23 ENCOUNTER — Other Ambulatory Visit: Payer: Self-pay | Admitting: Osteopathic Medicine

## 2022-08-21 ENCOUNTER — Other Ambulatory Visit: Payer: Self-pay | Admitting: Medical-Surgical

## 2022-09-05 ENCOUNTER — Other Ambulatory Visit: Payer: Self-pay

## 2022-09-12 ENCOUNTER — Other Ambulatory Visit: Payer: Self-pay | Admitting: Medical-Surgical

## 2022-09-28 ENCOUNTER — Telehealth: Payer: Self-pay | Admitting: Medical-Surgical

## 2022-09-30 NOTE — Telephone Encounter (Signed)
Patient scheduled for 12/28.

## 2022-10-02 ENCOUNTER — Encounter: Payer: Self-pay | Admitting: Medical-Surgical

## 2022-10-02 ENCOUNTER — Ambulatory Visit: Payer: Federal, State, Local not specified - PPO | Admitting: Medical-Surgical

## 2022-10-02 VITALS — BP 118/79 | HR 80 | Resp 20 | Ht 66.0 in | Wt 165.9 lb

## 2022-10-02 DIAGNOSIS — J4541 Moderate persistent asthma with (acute) exacerbation: Secondary | ICD-10-CM | POA: Diagnosis not present

## 2022-10-02 DIAGNOSIS — F419 Anxiety disorder, unspecified: Secondary | ICD-10-CM

## 2022-10-02 DIAGNOSIS — F32A Depression, unspecified: Secondary | ICD-10-CM

## 2022-10-02 DIAGNOSIS — I10 Essential (primary) hypertension: Secondary | ICD-10-CM | POA: Diagnosis not present

## 2022-10-02 DIAGNOSIS — Z0289 Encounter for other administrative examinations: Secondary | ICD-10-CM

## 2022-10-02 MED ORDER — PREDNISONE 50 MG PO TABS: 50.0000 mg | ORAL_TABLET | Freq: Every day | ORAL | 0 refills | Status: DC

## 2022-10-02 MED ORDER — ALBUTEROL SULFATE (2.5 MG/3ML) 0.083% IN NEBU: 2.5000 mg | INHALATION_SOLUTION | Freq: Four times a day (QID) | RESPIRATORY_TRACT | 1 refills | Status: DC | PRN

## 2022-10-02 MED ORDER — ESCITALOPRAM OXALATE 20 MG PO TABS: 20.0000 mg | ORAL_TABLET | Freq: Every day | ORAL | 1 refills | Status: DC

## 2022-10-02 MED ORDER — ALBUTEROL SULFATE HFA 108 (90 BASE) MCG/ACT IN AERS: INHALATION_SPRAY | RESPIRATORY_TRACT | 99 refills | Status: DC

## 2022-10-02 NOTE — Progress Notes (Signed)
Established Patient Office Visit  Subjective   Patient ID: Toni Harmon, female   DOB: 11-25-1975 Age: 46 y.o. MRN: [id number]   Chief Complaint  Patient presents with   Asthma   Follow-up   Form Completion   Medication Refill    FMLA paperwork   HPI Pleasant 46 year old female presenting today for the following:  Mood: taking Lexapro 20mg  daily, tolerating well without side effects. Feels the medication is working well and keeping her mood stable. Denies SI/HI.  Asthma: has been having significant trouble with wheezing, shortness of breath, and coughing for the past week. Symptoms worse at night. Has been wearing her mask at work due to dust, dirt, and illness so does not think that is what has caused her worsened symptoms. No additional viral symptoms. Using Albuterol inhaler/nebs and taking Wixela as prescribed. Taking Singulair in the morning since the daily exposure at work wasn't managed with nighttime dosing. Has paperwork to fill out for and FMLA related to her asthma.    Objective:    Vitals:   10/02/22 1104  BP: 118/79  Pulse: 80  Resp: 20  Height: 5\' 6"  (1.676 m)  Weight: 165 lb 14.4 oz (75.3 kg)  SpO2: 100%  BMI (Calculated): 26.79    Physical Exam Vitals and nursing note reviewed.  Constitutional:      General: She is not in acute distress.    Appearance: Normal appearance. She is not ill-appearing.  HENT:     Head: Normocephalic and atraumatic.  Cardiovascular:     Rate and Rhythm: Normal rate and regular rhythm.     Pulses: Normal pulses.     Heart sounds: Normal heart sounds.  Pulmonary:     Effort: Pulmonary effort is normal. No respiratory distress.     Breath sounds: Normal breath sounds. No wheezing, rhonchi or rales.  Skin:    General: Skin is warm and dry.  Neurological:     Mental Status: She is alert and oriented to person, place, and time.  Psychiatric:        Mood and Affect: Mood normal.        Behavior: Behavior  normal.        Thought Content: Thought content normal.        Judgment: Judgment normal.   No results found for this or any previous visit (from the past 24 hour(s)).     The 10-year ASCVD risk score (Arnett DK, et al., 2019) is: 0.8%   Values used to calculate the score:     Age: 11 years     Sex: Female     Is Non-Hispanic African American: Yes     Diabetic: No     Tobacco smoker: No     Systolic Blood Pressure: 118 mmHg     Is BP treated: Yes     HDL Cholesterol: 89 mg/dL     Total Cholesterol: 257 mg/dL   Assessment & Plan:   1. Essential hypertension BP at goal. Continue HCTZ 25mg  daily.   2. Anxiety and depression Stable. Continue Lexapro 20mg  daily.   3. Moderate persistent asthma with acute exacerbation Refilling meds. Adding prednisone 50mg  daily x 5 days. If symptoms still not well managed, may need to consider switching from 2020 to [place] or Trelegy depending on insurance coverage.  - albuterol (PROVENTIL) (2.5 MG/3ML) 0.083% nebulizer solution; Take 3 mLs (2.5 mg total) by nebulization every 6 (six) hours as needed for wheezing or shortness of breath.  Dispense: 150 mL; Refill: 1 - albuterol (VENTOLIN HFA) 108 (90 Base) MCG/ACT inhaler; INHALE 2 PUFFS BY MOUTH EVERY 4 HOURS AS NEEDED FOR WHEEZING - VENTOLIN DAW (PT SYMPTOMS BETTER CONTROLLED W/ BRAND NAME)  Dispense: 18 each; Refill: PRN  4. Encounter for completion of form with patient Forms completed during appointment. Copies made for upload into her chart. Letter given for absences from work this week.   Return in about 6 months (around 04/03/2023) for chronic disease follow up.  ___________________________________________ Thayer Ohm, DNP, APRN, FNP-BC [facility]

## 2022-10-11 ENCOUNTER — Other Ambulatory Visit: Payer: Self-pay | Admitting: Medical-Surgical

## 2022-11-05 DIAGNOSIS — F419 Anxiety disorder, unspecified: Secondary | ICD-10-CM | POA: Diagnosis not present

## 2022-11-12 ENCOUNTER — Other Ambulatory Visit: Payer: Self-pay | Admitting: Medical-Surgical

## 2022-12-08 DIAGNOSIS — Z0189 Encounter for other specified special examinations: Secondary | ICD-10-CM | POA: Diagnosis not present

## 2022-12-08 DIAGNOSIS — E876 Hypokalemia: Secondary | ICD-10-CM | POA: Diagnosis not present

## 2022-12-12 ENCOUNTER — Ambulatory Visit
Admission: RE | Admit: 2022-12-12 | Discharge: 2022-12-12 | Disposition: A | Discharge status: Home / Self Care | Payer: Federal, State, Local not specified - PPO | Source: Ambulatory Visit | Attending: Urgent Care | Admitting: Urgent Care

## 2022-12-12 VITALS — BP 128/87 | HR 79 | Temp 98.3°F | Resp 18 | Ht 66.0 in | Wt 160.0 lb

## 2022-12-12 DIAGNOSIS — R0602 Shortness of breath: Secondary | ICD-10-CM | POA: Diagnosis not present

## 2022-12-12 DIAGNOSIS — R42 Dizziness and giddiness: Secondary | ICD-10-CM

## 2022-12-12 DIAGNOSIS — J4540 Moderate persistent asthma, uncomplicated: Secondary | ICD-10-CM

## 2022-12-12 DIAGNOSIS — I10 Essential (primary) hypertension: Secondary | ICD-10-CM

## 2022-12-12 DIAGNOSIS — E875 Hyperkalemia: Secondary | ICD-10-CM | POA: Diagnosis not present

## 2022-12-12 MED ORDER — PREDNISONE 20 MG PO TABS: 40.0000 mg | ORAL_TABLET | Freq: Every day | ORAL | 0 refills | Status: DC

## 2022-12-12 NOTE — ED Provider Notes (Signed)
[place]  Note:  This document was prepared using Dragon voice recognition software and may include unintentional dictation errors.  MRN: [id number] DOB: 09/13/1976  Subjective:   Toni Harmon is a 47 y.o. female presenting for 1 week history of persistent intermittent dizziness, shortness of breath, occasional nausea without vomiting.  Patient was recently seen and started on potassium due to hypokalemia.  She does take HCTZ as needed depending on her blood pressure readings for the day.  For the past month she has been using it daily.  She also has been using her albuterol inhaler more frequently in the past week.  No fever, headache, confusion, weakness, numbness or tingling, chest pain, abdominal pain, hematuria.  No history of arrhythmias.  No smoking, drug use.  No current facility-administered medications for this encounter.  Current Outpatient Medications:    albuterol (PROVENTIL) (2.5 MG/3ML) 0.083% nebulizer solution, Take 3 mLs (2.5 mg total) by nebulization every 6 (six) hours as needed for wheezing or shortness of breath., Disp: 150 mL, Rfl: 1   albuterol (VENTOLIN HFA) 108 (90 Base) MCG/ACT inhaler, INHALE 2 PUFFS BY MOUTH EVERY 4 HOURS AS NEEDED FOR WHEEZING - VENTOLIN DAW (PT SYMPTOMS BETTER CONTROLLED W/ BRAND NAME), Disp: 18 each, Rfl: PRN   AMBULATORY NON FORMULARY MEDICATION, Nebulizer with necessary accessories.  Dx: Moderate Persistent Asthma, Disp: 1 Units, Rfl: 0   budesonide (RHINOCORT AQUA) 32 MCG/ACT nasal spray, Place 2 sprays into both nostrils daily., Disp: 8.6 g, Rfl: 11   cetirizine HCl (ZYRTEC) 5 MG/5ML SOLN, Take 2.5 mg by mouth daily. Patient takes  1/4 teaspoon at bedtime., Disp: , Rfl:    Choriogonadotropin Alfa 250 MCG/0.5ML injection, , Disp: , Rfl:    EPINEPHrine (EPIPEN 2-PAK) 0.3 mg/0.3 mL IJ SOAJ injection, Inject 0.3 mg into the muscle as needed for anaphylaxis., Disp: 1 each, Rfl: 1   escitalopram (LEXAPRO) 20 MG tablet,  Take 1 tablet (20 mg total) by mouth daily., Disp: 90 tablet, Rfl: 1   hydrochlorothiazide (HYDRODIURIL) 25 MG tablet, TAKE 1 TABLET (25 MG TOTAL) BY MOUTH DAILY., Disp: 30 tablet, Rfl: 0   ibuprofen (ADVIL,MOTRIN) 600 MG tablet, 2 (two) times daily as needed., Disp: , Rfl: 0   ipratropium (ATROVENT) 0.06 % nasal spray, SPRAY 2 SPRAYS INTO EACH NOSTRIL 4X DAILY AS NEEDED FOR SINUS OR EAR CONGESTION/POSTNASAL DRIP, Disp: 15 mL, Rfl: 1   letrozole (FEMARA) 2.5 MG tablet, Take by mouth., Disp: , Rfl:    loratadine (CLARITIN) 10 MG tablet, Take 1 tablet (10 mg total) by mouth daily., Disp: 90 tablet, Rfl: 1   montelukast (SINGULAIR) 10 MG tablet, TAKE 1 TABLET BY MOUTH EVERYDAY AT BEDTIME, Disp: 90 tablet, Rfl: 1   NON FORMULARY, daily., Disp: , Rfl:    NP THYROID 15 MG tablet, Take 45 mg by mouth every morning., Disp: , Rfl:    NP THYROID 30 MG tablet, Take 30 mg by mouth daily., Disp: , Rfl:    Potassium 99 MG TABS, Take by mouth daily., Disp: , Rfl:    predniSONE (DELTASONE) 50 MG tablet, Take 1 tablet (50 mg total) by mouth daily., Disp: 5 tablet, Rfl: 0   Prenatal Vit-Fe Fumarate-FA (PRENATAL MULTIVITAMIN) TABS tablet, Take 1 tablet by mouth daily at 12 noon., Disp: , Rfl:    Spacer/Aero-Holding Chambers (AEROCHAMBER PLUS) inhaler, Use as instructed, Disp: 1 each, Rfl: 2   vitamin C (ASCORBIC ACID) 500 MG tablet, Take 500 mg by mouth daily., Disp: , Rfl:  WIXELA INHUB 500-50 MCG/ACT AEPB, INHALE 1 PUFF BY MOUTH TWICE A DAY, Disp: 60 each, Rfl: 1   Allergies  Allergen Reactions   Compazine [Prochlorperazine Edisylate] Other (See Comments)    seizure   Corn-Containing Products Hives, Diarrhea and Other (See Comments)   Peanut-Containing Drug Products Anaphylaxis    Other reaction(s): Lip swelling   Penicillins Anaphylaxis   Ppd [Tuberculin Purified Protein Derivative] Hives and Swelling    Patient stated,"can't receive the injection anymore per MD order. Must get the xray."   Sesame Oil  Hives   Soybean Oil Anaphylaxis   Egg-Derived Products Nausea And Vomiting and Hives   Nitrous Oxide Nausea And Vomiting   Other Hives and Other (See Comments)    Tree nuts, melon, gluten intolerance Other reaction(s): Lip swelling   Shellfish Allergy Swelling   Dust Mite Extract Hives   Food    Mixed Ragweed Hives   Molds & Smuts Hives   Peanut Oil Swelling   Pollen Extract Hives   Prochlorperazine Other (See Comments)   Tetanus Toxoids Other (See Comments)    Per patient - she gets an adverse reaction to the vaccine.    Latex Hives and Rash    Past Medical History:  Diagnosis Date   Asthma    GERD (gastroesophageal reflux disease)    Hypertension    only to combat the effects of hctz     Past Surgical History:  Procedure Laterality Date   DILATION AND CURETTAGE OF UTERUS  12/2011   TONSILLECTOMY     TOOTH EXTRACTION      Family History  Problem Relation Age of Onset   Cervical cancer Mother 6   Hypertension Mother    Food Allergy Mother    Cancer Mother    Diabetes Maternal Grandfather     Social History   Tobacco Use   Smoking status: Never   Smokeless tobacco: Never  Vaping Use   Vaping Use: Never used  Substance Use Topics   Alcohol use: No    Alcohol/week: 0.0 standard drinks of alcohol   Drug use: No    ROS   Objective:   Vitals: BP 128/87 (BP Location: Right Arm)   Pulse 79   Temp 98.3 F (36.8 C) (Oral)   Resp 18   Ht '5\' 6"'$  (1.676 m)   Wt 160 lb (72.6 kg)   LMP 12/08/2022 (Exact Date)   SpO2 97%   BMI 25.82 kg/m   Physical Exam Constitutional:      General: She is not in acute distress.    Appearance: Normal appearance. She is well-developed. She is not ill-appearing, toxic-appearing or diaphoretic.  HENT:     Head: Normocephalic and atraumatic.     Nose: Nose normal.     Mouth/Throat:     Mouth: Mucous membranes are moist.  Eyes:     General: No scleral icterus.       Right eye: No discharge.        Left eye: No  discharge.     Extraocular Movements: Extraocular movements intact.  Neck:     Meningeal: Brudzinski's sign and Kernig's sign absent.  Cardiovascular:     Rate and Rhythm: Normal rate and regular rhythm.     Heart sounds: Normal heart sounds. No murmur heard.    No friction rub. No gallop.  Pulmonary:     Effort: Pulmonary effort is normal. No respiratory distress.     Breath sounds: No stridor. No wheezing, rhonchi or rales.  Chest:     Chest wall: No tenderness.  Skin:    General: Skin is warm and dry.  Neurological:     General: No focal deficit present.     Mental Status: She is alert and oriented to person, place, and time.     Cranial Nerves: No cranial nerve deficit, dysarthria or facial asymmetry.     Motor: No weakness or pronator drift.     Coordination: Romberg sign negative. Coordination normal. Finger-Nose-Finger Test and Heel to [facility] Test normal. Rapid alternating movements normal.     Gait: Gait and tandem walk normal.     Deep Tendon Reflexes: Reflexes normal.  Psychiatric:        Mood and Affect: Mood normal.        Behavior: Behavior normal.        Thought Content: Thought content normal.        Judgment: Judgment normal.     Lab Results: +/- 30 days of the encounter Date/Time Source Result Type Result - Unit Interpretation Reference Range Comment  Dec 08, 2022 03:28 PM [place] (in mg/dL) 0.663   0.6-1.4        UREA NITROGEN 7   5-23        GLUCOSE 82   74-118        SODIUM 143   134.0-146.0        POTASSIUM 2.7 LL 3.4-5.0        CHLORIDE 106   99-109        CO2 32   22.0-32.0        CALCIUM 9.1   8.5-10.5        eGFR_CKD 110   See_Comment    Dec 08, 2022 11:24 AM [facility]         Assessment and Plan :   PDMP not reviewed this encounter.  1. Dizziness   2. Shortness of breath   3. Hyperkalemia   4. Moderate persistent asthma without complication   5. Essential hypertension      Offered repeat labs but patient declined.  Also offered chest x-ray due to her shortness of breath and frequent use of albuterol.  Patient declined this as well.  Ultimately, patient prefers to follow-up with her doctor through the [place] for recheck on her potassium levels.  I did offer prednisone to help with her respiratory symptoms in the context of her asthma.  She was agreeable to this. Counseled patient on potential for adverse effects with medications prescribed/recommended today, ER and return-to-clinic precautions discussed, patient verbalized understanding.    Jaynee Eagles, PA-C 12/12/22 1455

## 2022-12-12 NOTE — ED Triage Notes (Signed)
Patient c/o dizziness when standing, recently was informed that her potassium levels are low.  Patient is having some nausea.  Patient was started on Potassium 77mq x bid started on Tuesday.  Some SOB, having to use Albuterol inhaler more frequently.  Using HCTZ 12.'5mg'$  as needed.

## 2022-12-17 ENCOUNTER — Other Ambulatory Visit: Payer: Self-pay | Admitting: Medical-Surgical

## 2022-12-17 DIAGNOSIS — R0602 Shortness of breath: Secondary | ICD-10-CM | POA: Diagnosis not present

## 2022-12-17 DIAGNOSIS — J309 Allergic rhinitis, unspecified: Secondary | ICD-10-CM | POA: Diagnosis not present

## 2022-12-17 DIAGNOSIS — E876 Hypokalemia: Secondary | ICD-10-CM | POA: Diagnosis not present

## 2023-01-03 ENCOUNTER — Other Ambulatory Visit: Payer: Self-pay | Admitting: Medical-Surgical

## 2023-01-06 DIAGNOSIS — E876 Hypokalemia: Secondary | ICD-10-CM | POA: Diagnosis not present

## 2023-01-16 DIAGNOSIS — N979 Female infertility, unspecified: Secondary | ICD-10-CM | POA: Diagnosis not present

## 2023-01-16 DIAGNOSIS — J45909 Unspecified asthma, uncomplicated: Secondary | ICD-10-CM | POA: Diagnosis not present

## 2023-01-16 DIAGNOSIS — D509 Iron deficiency anemia, unspecified: Secondary | ICD-10-CM | POA: Diagnosis not present

## 2023-01-16 DIAGNOSIS — E039 Hypothyroidism, unspecified: Secondary | ICD-10-CM | POA: Diagnosis not present

## 2023-01-17 ENCOUNTER — Other Ambulatory Visit: Payer: Self-pay | Admitting: Medical-Surgical

## 2023-01-23 DIAGNOSIS — N979 Female infertility, unspecified: Secondary | ICD-10-CM | POA: Diagnosis not present

## 2023-01-23 DIAGNOSIS — D509 Iron deficiency anemia, unspecified: Secondary | ICD-10-CM | POA: Diagnosis not present

## 2023-01-23 DIAGNOSIS — G4700 Insomnia, unspecified: Secondary | ICD-10-CM | POA: Diagnosis not present

## 2023-01-23 DIAGNOSIS — L659 Nonscarring hair loss, unspecified: Secondary | ICD-10-CM | POA: Diagnosis not present

## 2023-01-23 DIAGNOSIS — E782 Mixed hyperlipidemia: Secondary | ICD-10-CM | POA: Diagnosis not present

## 2023-01-23 DIAGNOSIS — F419 Anxiety disorder, unspecified: Secondary | ICD-10-CM | POA: Diagnosis not present

## 2023-01-23 DIAGNOSIS — J45909 Unspecified asthma, uncomplicated: Secondary | ICD-10-CM | POA: Diagnosis not present

## 2023-01-23 DIAGNOSIS — E039 Hypothyroidism, unspecified: Secondary | ICD-10-CM | POA: Diagnosis not present

## 2023-02-02 ENCOUNTER — Ambulatory Visit: Payer: No Typology Code available for payment source | Admitting: Pulmonary Disease

## 2023-02-02 ENCOUNTER — Encounter: Payer: Self-pay | Admitting: Pulmonary Disease

## 2023-02-02 VITALS — BP 116/68 | HR 82 | Ht 66.0 in | Wt 162.0 lb

## 2023-02-02 DIAGNOSIS — J4540 Moderate persistent asthma, uncomplicated: Secondary | ICD-10-CM

## 2023-02-02 LAB — CBC WITH DIFFERENTIAL/PLATELET
Basophils Absolute: 0 10*3/uL (ref 0.0–0.1)
Basophils Relative: 0.6 % (ref 0.0–3.0)
Eosinophils Absolute: 0.1 10*3/uL (ref 0.0–0.7)
Eosinophils Relative: 2.1 % (ref 0.0–5.0)
HCT: 37.4 % (ref 36.0–46.0)
Hemoglobin: 12.6 g/dL (ref 12.0–15.0)
Lymphocytes Relative: 44.4 % (ref 12.0–46.0)
Lymphs Abs: 1.9 10*3/uL (ref 0.7–4.0)
MCHC: 33.6 g/dL (ref 30.0–36.0)
MCV: 87.5 fl (ref 78.0–100.0)
Monocytes Absolute: 0.4 10*3/uL (ref 0.1–1.0)
Monocytes Relative: 9 % (ref 3.0–12.0)
Neutro Abs: 1.8 10*3/uL (ref 1.4–7.7)
Neutrophils Relative %: 43.9 % (ref 43.0–77.0)
Platelets: 433 10*3/uL — ABNORMAL HIGH (ref 150.0–400.0)
RBC: 4.27 Mil/uL (ref 3.87–5.11)
RDW: 14.1 % (ref 11.5–15.5)
WBC: 4.2 10*3/uL (ref 4.0–10.5)

## 2023-02-02 MED ORDER — SPIRIVA RESPIMAT 1.25 MCG/ACT IN AERS: 2.0000 | INHALATION_SPRAY | Freq: Every day | RESPIRATORY_TRACT | 11 refills | Status: AC

## 2023-02-02 NOTE — Patient Instructions (Addendum)
Continue Wixella 500-87mcg 1 puff twice daily - rinse mouth out after each use  Start spiriva 1.70mcg 2 puffs daily  Use albuterol inhaler as needed  We will check labs today   We will consider starting you on Dupixent therapy. You will meet with our pharamcy team prior to starting this medication.  Follow up in 2 months for breathing tests and follow up visit

## 2023-02-02 NOTE — Progress Notes (Signed)
Synopsis: Referred in Aprl 2024 for asthma by Edison Nasuti, MD  Subjective:   PATIENT ID: Toni Harmon GENDER: female DOB: 11/14/1975, MRN: [id number]  HPI  Chief Complaint  Patient presents with   Consult    Referred by PCP for history of asthma. States she was diagnosed back in 2004. Symptoms tend to come and go depending on what she is exposed to.    Toni Harmon is a 47 year old woman, never smoker with history of GERD, hypertension and asthma who is referred to pulmonary clinic for asthma.   She was diagnosed with asthma at 47 years old. She has significant allergies that bother her asthma. She has exercise induced symptoms. Strong perfumes and colognes can bother her breathing. She was on prednisone in February for her breathing. She has a lot of post nasal drip. She is taking pseudophed every day. She is using rhinocort nasal spray daily. She is using atrovent nasal spray as needed. No history of nose or sinus surgeries. She does have deviated septum. No history of nasal or sinus polyps. She is using Claritin, cetirizine and Singulair daily for allergies. She has seen an allergist in the past.   She has issues with heartburn and reflux. She was taking pepcid previously. She is eating 2-3 hours prior to bed time. She is sleeping elevated on her pillows. She does have base adjustable bed and sleeps elevated as well.   She does have night time awakenings with cough, wheezing and shortness of breath.   She reports over recent years she is taking prednisone taper at least 6 times per year.   She works at the [employer], works as Lexicographer. Never smoker. Some second hand smoke in childhood.   Past Medical History:  Diagnosis Date   Asthma    GERD (gastroesophageal reflux disease)    Hypertension    only to combat the effects of hctz     Family History  Problem Relation Age of Onset   Cervical cancer Mother 14   Hypertension Mother    Food Allergy Mother     Cancer Mother    Diabetes Maternal Grandfather      Social History   Socioeconomic History   Marital status: Married    Spouse name: Not on file   Number of children: Not on file   Years of education: Not on file   Highest education level: Not on file  Occupational History   Not on file  Tobacco Use   Smoking status: Never   Smokeless tobacco: Never  Vaping Use   Vaping Use: Never used  Substance and Sexual Activity   Alcohol use: No    Alcohol/week: 0.0 standard drinks of alcohol   Drug use: No   Sexual activity: Yes    Partners: Male  Other Topics Concern   Not on file  Social History Narrative   Not on file   Social Determinants of Health   Financial Resource Strain: Not on file  Food Insecurity: Not on file  Transportation Needs: Not on file  Physical Activity: Not on file  Stress: Not on file  Social Connections: Not on file  Intimate Partner Violence: Not on file     Allergies  Allergen Reactions   Compazine [Prochlorperazine Edisylate] Other (See Comments)    seizure   Corn-Containing Products Hives, Diarrhea and Other (See Comments)   Peanut-Containing Drug Products Anaphylaxis    Other reaction(s): Lip swelling   Penicillins Anaphylaxis   Ppd [  Tuberculin Purified Protein Derivative] Hives and Swelling    Patient stated,"can't receive the injection anymore per MD order. Must get the xray."   Sesame Oil Hives   Soybean Oil Anaphylaxis   Egg-Derived Products Nausea And Vomiting and Hives   Nitrous Oxide Nausea And Vomiting   Other Hives and Other (See Comments)    Tree nuts, melon, gluten intolerance Other reaction(s): Lip swelling   Shellfish Allergy Swelling   Dust Mite Extract Hives   Food    Mixed Ragweed Hives   Molds & Smuts Hives   Peanut Oil Swelling   Pollen Extract Hives   Prochlorperazine Other (See Comments)   Tetanus Toxoids Other (See Comments)    Per patient - she gets an adverse reaction to the vaccine.    Latex Hives and  Rash     Outpatient Medications Prior to Visit  Medication Sig Dispense Refill   albuterol (PROVENTIL) (2.5 MG/3ML) 0.083% nebulizer solution Take 3 mLs (2.5 mg total) by nebulization every 6 (six) hours as needed for wheezing or shortness of breath. 150 mL 1   albuterol (VENTOLIN HFA) 108 (90 Base) MCG/ACT inhaler INHALE 2 PUFFS BY MOUTH EVERY 4 HOURS AS NEEDED FOR WHEEZING - VENTOLIN DAW (PT SYMPTOMS BETTER CONTROLLED W/ BRAND NAME) 18 each PRN   AMBULATORY NON FORMULARY MEDICATION Nebulizer with necessary accessories.  Dx: Moderate Persistent Asthma 1 Units 0   budesonide (RHINOCORT AQUA) 32 MCG/ACT nasal spray Place 2 sprays into both nostrils daily. 8.6 g 11   cetirizine HCl (ZYRTEC) 5 MG/5ML SOLN Take 2.5 mg by mouth daily. Patient takes  1/4 teaspoon at bedtime.     Choriogonadotropin Alfa 250 MCG/0.5ML injection      EPINEPHrine (EPIPEN 2-PAK) 0.3 mg/0.3 mL IJ SOAJ injection Inject 0.3 mg into the muscle as needed for anaphylaxis. 1 each 1   escitalopram (LEXAPRO) 20 MG tablet Take 1 tablet (20 mg total) by mouth daily. 90 tablet 1   hydrochlorothiazide (HYDRODIURIL) 25 MG tablet TAKE 1 TABLET (25 MG TOTAL) BY MOUTH DAILY. 30 tablet 2   ibuprofen (ADVIL,MOTRIN) 600 MG tablet 2 (two) times daily as needed.  0   ipratropium (ATROVENT) 0.06 % nasal spray SPRAY 2 SPRAYS INTO EACH NOSTRIL 4X DAILY AS NEEDED FOR SINUS OR EAR CONGESTION/POSTNASAL DRIP 15 mL 1   letrozole (FEMARA) 2.5 MG tablet Take by mouth.     loratadine (CLARITIN) 10 MG tablet Take 1 tablet (10 mg total) by mouth daily. 90 tablet 1   montelukast (SINGULAIR) 10 MG tablet TAKE 1 TABLET BY MOUTH EVERYDAY AT BEDTIME 90 tablet 1   NON FORMULARY daily.     NP THYROID 15 MG tablet Take 45 mg by mouth every morning.     NP THYROID 30 MG tablet Take 30 mg by mouth daily.     Potassium 99 MG TABS Take by mouth daily.     Prenatal Vit-Fe Fumarate-FA (PRENATAL MULTIVITAMIN) TABS tablet Take 1 tablet by mouth daily at 12 noon.      Spacer/Aero-Holding Chambers (AEROCHAMBER PLUS) inhaler Use as instructed 1 each 2   vitamin C (ASCORBIC ACID) 500 MG tablet Take 500 mg by mouth daily.     WIXELA INHUB 500-50 MCG/ACT AEPB INHALE 1 PUFF BY MOUTH TWICE A DAY 60 each 1   predniSONE (DELTASONE) 20 MG tablet Take 2 tablets (40 mg total) by mouth daily with breakfast. 10 tablet 0   No facility-administered medications prior to visit.   Review of Systems  Constitutional:  Negative for chills, fever, malaise/fatigue and weight loss.  HENT:  Positive for congestion. Negative for sinus pain and sore throat.   Eyes: Negative.   Respiratory:  Positive for shortness of breath. Negative for cough, hemoptysis, sputum production and wheezing.   Cardiovascular:  Negative for chest pain, palpitations, orthopnea, claudication and leg swelling.  Gastrointestinal:  Positive for abdominal pain and heartburn. Negative for nausea and vomiting.       Difficulty swallowing  Genitourinary: Negative.   Musculoskeletal:  Positive for joint pain. Negative for myalgias.  Skin:  Negative for rash.  Neurological:  Negative for weakness.  Endo/Heme/Allergies:  Positive for environmental allergies.  Psychiatric/Behavioral:  The patient is nervous/anxious.    Objective:   Vitals:   02/02/23 0906  BP: 116/68  Pulse: 82  SpO2: 98%  Weight: 162 lb (73.5 kg)  Height: 5\' 6"  (1.676 m)    Physical Exam Constitutional:      General: She is not in acute distress.    Appearance: She is not ill-appearing.  HENT:     Head: Normocephalic and atraumatic.  Eyes:     General: No scleral icterus.    Conjunctiva/sclera: Conjunctivae normal.     Pupils: Pupils are equal, round, and reactive to light.  Cardiovascular:     Rate and Rhythm: Normal rate and regular rhythm.     Pulses: Normal pulses.     Heart sounds: Normal heart sounds. No murmur heard. Pulmonary:     Effort: Pulmonary effort is normal.     Breath sounds: Normal breath sounds. No wheezing,  rhonchi or rales.  Abdominal:     General: Bowel sounds are normal.     Palpations: Abdomen is soft.  Musculoskeletal:     Right lower leg: No edema.     Left lower leg: No edema.  Lymphadenopathy:     Cervical: No cervical adenopathy.  Skin:    General: Skin is warm and dry.  Neurological:     General: No focal deficit present.     Mental Status: She is alert.  Psychiatric:        Mood and Affect: Mood normal.        Behavior: Behavior normal.        Thought Content: Thought content normal.        Judgment: Judgment normal.    CBC    Component Value Date/Time   WBC 4.2 02/02/2023 0958   RBC 4.27 02/02/2023 0958   HGB 12.6 02/02/2023 0958   HGB 13.4 10/07/2018 0821   HCT 37.4 02/02/2023 0958   PLT 433.0 (H) 02/02/2023 0958   PLT 360 10/07/2018 0821   MCV 87.5 02/02/2023 0958   MCH 29.0 04/02/2022 0000   MCHC 33.6 02/02/2023 0958   RDW 14.1 02/02/2023 0958   LYMPHSABS 1.9 02/02/2023 0958   MONOABS 0.4 02/02/2023 0958   EOSABS 0.1 02/02/2023 0958   BASOSABS 0.0 02/02/2023 0958      Latest Ref Rng & Units 04/02/2022   12:00 AM 10/03/2021   12:00 AM 10/18/2020   10:23 AM  BMP  Glucose 65 - 99 mg/dL 78  72  72   BUN 7 - 25 mg/dL 16  9  24    Creatinine 0.50 - 0.99 mg/dL 1.61  0.96  0.45   BUN/Creat Ratio 6 - 22 (calc) NOT APPLICABLE  NOT APPLICABLE  NOT APPLICABLE   Sodium 135 - 146 mmol/L 139  143  140   Potassium 3.5 - 5.3 mmol/L 4.7  4.5  4.7   Chloride 98 - 110 mmol/L 102  105  100   CO2 20 - 32 mmol/L 29  32  28   Calcium 8.6 - 10.2 mg/dL 9.9  16.1  09.6    Chest imaging:  PFT:     No data to display          Labs:  Path:  Echo:  Heart Catheterization:    Assessment & Plan:   Moderate persistent asthma without complication - Plan: Tiotropium Bromide Monohydrate (SPIRIVA RESPIMAT) 1.25 MCG/ACT AERS, CBC with Differential/Platelet, IgE, Pulmonary Function Test, IgE, CBC with Differential/Platelet  Discussion: Toni Harmon is a 47 year old  woman, never smoker with history of GERD, hypertension and asthma who is referred to pulmonary clinic for asthma.   She appears to have moderate persistent asthma based on her history.  Check CBC with differential and IgE levels today.  He is to continue Wixela 500-50 mcg 1 puff twice daily and we will add Spiriva 1.25 mcg 2 puffs daily.  She can continue as needed albuterol use.  We will check pulmonary function test at follow-up visit in 2 months.  We will consider biologic therapy with Dupixent based on her lab and PFT results along with monitoring her response to the addition of LAMA therapy to her regimen.  Follow-up in 2 months.  Melody Comas, MD [facility] Office: [phone]    Current Outpatient Medications:    albuterol (PROVENTIL) (2.5 MG/3ML) 0.083% nebulizer solution, Take 3 mLs (2.5 mg total) by nebulization every 6 (six) hours as needed for wheezing or shortness of breath., Disp: 150 mL, Rfl: 1   albuterol (VENTOLIN HFA) 108 (90 Base) MCG/ACT inhaler, INHALE 2 PUFFS BY MOUTH EVERY 4 HOURS AS NEEDED FOR WHEEZING - VENTOLIN DAW (PT SYMPTOMS BETTER CONTROLLED W/ BRAND NAME), Disp: 18 each, Rfl: PRN   AMBULATORY NON FORMULARY MEDICATION, Nebulizer with necessary accessories.  Dx: Moderate Persistent Asthma, Disp: 1 Units, Rfl: 0   budesonide (RHINOCORT AQUA) 32 MCG/ACT nasal spray, Place 2 sprays into both nostrils daily., Disp: 8.6 g, Rfl: 11   cetirizine HCl (ZYRTEC) 5 MG/5ML SOLN, Take 2.5 mg by mouth daily. Patient takes  1/4 teaspoon at bedtime., Disp: , Rfl:    Choriogonadotropin Alfa 250 MCG/0.5ML injection, , Disp: , Rfl:    EPINEPHrine (EPIPEN 2-PAK) 0.3 mg/0.3 mL IJ SOAJ injection, Inject 0.3 mg into the muscle as needed for anaphylaxis., Disp: 1 each, Rfl: 1   escitalopram (LEXAPRO) 20 MG tablet, Take 1 tablet (20 mg total) by mouth daily., Disp: 90 tablet, Rfl: 1   hydrochlorothiazide (HYDRODIURIL) 25 MG tablet, TAKE 1 TABLET (25 MG TOTAL)  BY MOUTH DAILY., Disp: 30 tablet, Rfl: 2   ibuprofen (ADVIL,MOTRIN) 600 MG tablet, 2 (two) times daily as needed., Disp: , Rfl: 0   ipratropium (ATROVENT) 0.06 % nasal spray, SPRAY 2 SPRAYS INTO EACH NOSTRIL 4X DAILY AS NEEDED FOR SINUS OR EAR CONGESTION/POSTNASAL DRIP, Disp: 15 mL, Rfl: 1   letrozole (FEMARA) 2.5 MG tablet, Take by mouth., Disp: , Rfl:    loratadine (CLARITIN) 10 MG tablet, Take 1 tablet (10 mg total) by mouth daily., Disp: 90 tablet, Rfl: 1   montelukast (SINGULAIR) 10 MG tablet, TAKE 1 TABLET BY MOUTH EVERYDAY AT BEDTIME, Disp: 90 tablet, Rfl: 1   NON FORMULARY, daily., Disp: , Rfl:    NP THYROID 15 MG tablet, Take 45 mg by mouth every morning., Disp: , Rfl:    NP THYROID 30 MG  tablet, Take 30 mg by mouth daily., Disp: , Rfl:    Potassium 99 MG TABS, Take by mouth daily., Disp: , Rfl:    Prenatal Vit-Fe Fumarate-FA (PRENATAL MULTIVITAMIN) TABS tablet, Take 1 tablet by mouth daily at 12 noon., Disp: , Rfl:    Spacer/Aero-Holding Chambers (AEROCHAMBER PLUS) inhaler, Use as instructed, Disp: 1 each, Rfl: 2   Tiotropium Bromide Monohydrate (SPIRIVA RESPIMAT) 1.25 MCG/ACT AERS, Inhale 2 puffs into the lungs daily., Disp: 4 g, Rfl: 11   vitamin C (ASCORBIC ACID) 500 MG tablet, Take 500 mg by mouth daily., Disp: , Rfl:    WIXELA INHUB 500-50 MCG/ACT AEPB, INHALE 1 PUFF BY MOUTH TWICE A DAY, Disp: 60 each, Rfl: 1

## 2023-02-03 LAB — IGE: IgE (Immunoglobulin E), Serum: 20 kU/L (ref <=114)

## 2023-02-06 ENCOUNTER — Encounter: Payer: Self-pay | Admitting: Pulmonary Disease

## 2023-02-06 DIAGNOSIS — E039 Hypothyroidism, unspecified: Secondary | ICD-10-CM | POA: Diagnosis not present

## 2023-02-06 DIAGNOSIS — N979 Female infertility, unspecified: Secondary | ICD-10-CM | POA: Diagnosis not present

## 2023-02-06 DIAGNOSIS — D509 Iron deficiency anemia, unspecified: Secondary | ICD-10-CM | POA: Diagnosis not present

## 2023-02-06 DIAGNOSIS — J45909 Unspecified asthma, uncomplicated: Secondary | ICD-10-CM | POA: Diagnosis not present

## 2023-02-16 DIAGNOSIS — Z8639 Personal history of other endocrine, nutritional and metabolic disease: Secondary | ICD-10-CM | POA: Diagnosis not present

## 2023-02-24 DIAGNOSIS — M19071 Primary osteoarthritis, right ankle and foot: Secondary | ICD-10-CM | POA: Diagnosis not present

## 2023-02-24 DIAGNOSIS — Z13820 Encounter for screening for osteoporosis: Secondary | ICD-10-CM | POA: Diagnosis not present

## 2023-02-24 DIAGNOSIS — M2142 Flat foot [pes planus] (acquired), left foot: Secondary | ICD-10-CM | POA: Diagnosis not present

## 2023-02-24 DIAGNOSIS — M19072 Primary osteoarthritis, left ankle and foot: Secondary | ICD-10-CM | POA: Diagnosis not present

## 2023-03-31 ENCOUNTER — Ambulatory Visit
Admission: RE | Admit: 2023-03-31 | Discharge: 2023-03-31 | Disposition: A | Discharge status: Home / Self Care | Payer: Federal, State, Local not specified - PPO | Source: Ambulatory Visit | Attending: Medical-Surgical | Admitting: Medical-Surgical

## 2023-03-31 VITALS — BP 117/79 | HR 92 | Temp 98.1°F | Resp 16

## 2023-03-31 DIAGNOSIS — M79671 Pain in right foot: Secondary | ICD-10-CM | POA: Diagnosis not present

## 2023-03-31 DIAGNOSIS — M2011 Hallux valgus (acquired), right foot: Secondary | ICD-10-CM

## 2023-03-31 DIAGNOSIS — M79672 Pain in left foot: Secondary | ICD-10-CM | POA: Diagnosis not present

## 2023-03-31 DIAGNOSIS — M2012 Hallux valgus (acquired), left foot: Secondary | ICD-10-CM | POA: Diagnosis not present

## 2023-03-31 NOTE — Discharge Instructions (Addendum)
Advised patient if additional documentation is required please do not hesitate to follow-up with me.  Advised patient to follow-up with [facility] podiatry for further evaluation.  Contact information is provided with this AVS.

## 2023-03-31 NOTE — ED Triage Notes (Signed)
[facility] with [facility] of bunions, and corns, and blisters since employer is mandating leather shoes. [facility] . Needs note for work to be able to wear tennis shoes

## 2023-03-31 NOTE — ED Provider Notes (Signed)
[facility]    CSN: [id number] Arrival date & time: 03/31/23  1715      History   Chief Complaint Chief Complaint  Patient presents with   Foot Pain    Note allowing fabric sneakers due to foot pain - Entered by patient    HPI Toni Harmon is a 47 y.o. female.   HPI Pleasant 47 year old female presents with request for note to allow her to wear fabric sneakers due to current bilateral foot pain.  Patient presents with bilateral bunions and corns and blisters since her employer mandated leather shoes.  [facility] is not until 05/05/2023.  Patient request note that we will allow her to wear much more comfortable running shoes instead.  PMH significant for HTN, lung nodule, and IBS.  Past Medical History:  Diagnosis Date   Asthma    GERD (gastroesophageal reflux disease)    Hypertension    only to combat the effects of hctz    Patient Active Problem List   Diagnosis Date Noted   GERD (gastroesophageal reflux disease) 03/28/2022   Allergic reaction to food 07/20/2020   Asthma, chronic, severe persistent, uncomplicated 04/25/2020   Abnormal findings in stool 02/05/2020   Lung nodule < 6cm on CT 08/12/2018   HLD (hyperlipidemia) 12/28/2017   Family history of cervical cancer 12/25/2017   Vitamin D deficiency 09/02/2016   Iron deficiency anemia 09/02/2016   Essential hypertension 11/16/2012   Moderate persistent asthma 11/16/2012   Degeneration of cervical intervertebral disc 11/16/2012   Allergic rhinitis 11/16/2012   IBS (irritable bowel syndrome) 11/16/2012   Anxiety and depression 11/16/2012   Food allergy, peanut 08/18/2012   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 Authorizing Provider  albuterol (PROVENTIL) (2.5 MG/3ML) 0.083% nebulizer solution Take 3 mLs (2.5 mg total) by nebulization every 6 (six) hours as needed for wheezing or shortness of breath. 10/02/22   [name], NP  albuterol (VENTOLIN HFA) 108 (90 Base) MCG/ACT inhaler INHALE 2 PUFFS BY MOUTH EVERY 4 HOURS AS NEEDED FOR WHEEZING - VENTOLIN DAW (PT SYMPTOMS BETTER CONTROLLED W/ BRAND NAME) 10/02/22   [name], NP  AMBULATORY NON FORMULARY MEDICATION Nebulizer with necessary accessories.  Dx: Moderate Persistent Asthma 10/26/13   Hommel, Sean, DO  budesonide (RHINOCORT AQUA) 32 MCG/ACT nasal spray Place 2 sprays into both nostrils daily. 04/25/19   [name], DO  cetirizine HCl (ZYRTEC) 5 MG/5ML SOLN Take 2.5 mg by mouth daily. Patient takes  1/4 teaspoon at bedtime.    [provider]  Choriogonadotropin Alfa 250 MCG/0.5ML injection  09/13/20   [provider]  EPINEPHrine (EPIPEN 2-PAK) 0.3 mg/0.3 mL IJ SOAJ injection Inject 0.3 mg into the  muscle as needed for anaphylaxis. 05/19/22   [name], MD  escitalopram (LEXAPRO) 20 MG tablet Take 1 tablet (20 mg total) by mouth daily. 10/02/22   [name], NP  hydrochlorothiazide (HYDRODIURIL) 25 MG tablet TAKE 1 TABLET (25 MG TOTAL) BY MOUTH DAILY. 01/19/23   [name], NP  ibuprofen (ADVIL,MOTRIN) 600 MG tablet 2 (two) times daily as needed. 12/10/16   [provider]  ipratropium (ATROVENT) 0.06 % nasal spray SPRAY 2 SPRAYS INTO EACH NOSTRIL 4X DAILY AS NEEDED FOR SINUS OR EAR CONGESTION/POSTNASAL DRIP 10/13/22   [name], NP  letrozole [facility]) 2.5 MG tablet Take by mouth. 03/17/22   [provider]  loratadine (CLARITIN) 10 MG tablet Take 1 tablet (10 mg total) by mouth daily. 11/08/15   Hommel,  [name], DO  montelukast (SINGULAIR) 10 MG tablet TAKE 1 TABLET BY MOUTH EVERYDAY AT BEDTIME 01/06/23   [name], NP  NON FORMULARY daily.    [provider]  NP THYROID 15 MG tablet Take 45 mg by mouth every morning. 02/28/22   [provider]  NP THYROID 30 MG tablet Take 30 mg by mouth daily. 03/16/22   [provider]  Potassium 99 MG TABS Take by mouth daily.    [provider]  Prenatal Vit-Fe Fumarate-FA (PRENATAL MULTIVITAMIN) TABS tablet Take 1 tablet by mouth daily at 12 noon.    [provider]  Spacer/Aero-Holding Chambers (AEROCHAMBER PLUS) inhaler Use as instructed 01/16/20   [name], DO  Tiotropium Bromide Monohydrate (SPIRIVA RESPIMAT) 1.25 MCG/ACT AERS Inhale 2 puffs into the lungs daily. 02/02/23   [name], MD  vitamin C (ASCORBIC ACID) 500 MG tablet Take 500 mg by mouth daily.    [provider]  [name] 500-50 MCG/ACT AEPB INHALE 1 PUFF BY MOUTH TWICE A DAY 05/29/22   [name], NP    Family History Family History  Problem Relation Age of Onset   Cervical cancer Mother 40   Hypertension Mother    Food Allergy Mother    Cancer Mother    Diabetes Maternal Grandfather     Social History Social History   Tobacco Use   Smoking status: Never   Smokeless tobacco: Never  Vaping Use   Vaping Use: Never used  Substance Use Topics   Alcohol use: No    Alcohol/week: 0.0 standard drinks of alcohol   Drug use: No     Allergies   Compazine [prochlorperazine edisylate], Corn-containing products, Peanut-containing drug products, Penicillins, Ppd [tuberculin purified protein derivative], Sesame oil, Soybean oil, Egg-derived products, Nitrous oxide, Other, Shellfish allergy, Dust mite extract, Food, Mixed ragweed, Molds & smuts, Peanut oil, Pollen extract, Prochlorperazine, Tetanus toxoids, and Latex   Review of Systems Review of Systems  Musculoskeletal:        Bilateral foot pain due to employer's  request of wearing leather shoes-patient request note that I will permit her to wear her running shoes.     Physical Exam Triage Vital Signs ED Triage Vitals  Enc Vitals Group     BP      Pulse      Resp      Temp      Temp src      SpO2      Weight      Height      Head Circumference      Peak Flow      Pain Score      Pain Loc  Pain Edu?      Excl. in GC?    No data found.  Updated Vital Signs BP 117/79   Pulse 92   Temp 98.1 F (36.7 C)   Resp 16   SpO2 98%    Physical Exam Vitals and nursing note reviewed.  Constitutional:      Appearance: Normal appearance. She is normal weight.  HENT:     Head: Normocephalic and atraumatic.     Mouth/Throat:     Mouth: Mucous membranes are moist.     Pharynx: Oropharynx is clear.  Eyes:     Extraocular Movements: Extraocular movements intact.     Conjunctiva/sclera: Conjunctivae normal.     Pupils: Pupils are equal, round, and reactive to light.  Cardiovascular:     Rate and Rhythm: Normal rate and regular rhythm.     Pulses: Normal pulses.     Heart sounds: Normal heart sounds.  Pulmonary:     Effort: Pulmonary effort is normal.     Breath sounds: Normal breath sounds. No wheezing, rhonchi or rales.  Musculoskeletal:        General: Normal range of motion.     Cervical back: Normal range of motion and neck supple.     Comments: Bilateral MTP joints: Hallux valgus noted bilaterally-please see image image below  Skin:    General: Skin is warm and dry.  Neurological:     General: No focal deficit present.     Mental Status: She is alert and oriented to person, place, and time. Mental status is at baseline.  Psychiatric:        Mood and Affect: Mood normal.        Behavior: Behavior normal.      UC Treatments / Results  Labs (all labs ordered are listed, but only abnormal results are displayed) Labs Reviewed - No data to display  EKG   Radiology No results found.  Procedures Procedures  (including critical care time)  Medications Ordered in UC Medications - No data to display  Initial Impression / Assessment and Plan / UC Course  I have reviewed the triage vital signs and the nursing notes.  Pertinent labs & imaging results that were available during my care of the patient were reviewed by me and considered in my medical decision making (see chart for details).     MDM: 1.  Bilateral foot pain-secondary to previous leather shoe wear required by her employer; 2.  Hallux valgus of left foot-evident on exam; 3.  Hallux valgus of right foot-evident on exam.  Work note provided for patient to employer to allow patient to wear preferred shoes while at work.  Patient advised to follow-up with [facility] podiatry for further evaluation of hallux valgus bilaterally. Advised patient if additional documentation is required please do not hesitate to follow-up with me.  Advised patient to follow-up with [facility] podiatry for further evaluation.  Contact information is provided with this AVS. patient discharged home, hemodynamically stable.  Final Clinical Impressions(s) / UC Diagnoses   Final diagnoses:  Bilateral foot pain  Hallux valgus of left foot  Hallux valgus of right foot     Discharge Instructions      Advised patient if additional documentation is required please do not hesitate to follow-up with me.  Advised patient to follow-up with [facility] podiatry for further evaluation.  Contact information is provided with this AVS.     ED Prescriptions   None    PDMP not reviewed  this encounter.   Trevor Iha, FNP 03/31/23 [phone]

## 2023-04-02 ENCOUNTER — Other Ambulatory Visit: Payer: Self-pay | Admitting: Medical-Surgical

## 2023-04-02 ENCOUNTER — Ambulatory Visit: Payer: Federal, State, Local not specified - PPO | Admitting: Medical-Surgical

## 2023-04-02 ENCOUNTER — Encounter: Payer: Self-pay | Admitting: Medical-Surgical

## 2023-04-02 VITALS — BP 117/80 | HR 86 | Ht 66.0 in | Wt 151.0 lb

## 2023-04-02 DIAGNOSIS — F32A Depression, unspecified: Secondary | ICD-10-CM

## 2023-04-02 DIAGNOSIS — J4550 Severe persistent asthma, uncomplicated: Secondary | ICD-10-CM

## 2023-04-02 DIAGNOSIS — F419 Anxiety disorder, unspecified: Secondary | ICD-10-CM

## 2023-04-02 DIAGNOSIS — J4541 Moderate persistent asthma with (acute) exacerbation: Secondary | ICD-10-CM

## 2023-04-02 DIAGNOSIS — I10 Essential (primary) hypertension: Secondary | ICD-10-CM | POA: Diagnosis not present

## 2023-04-02 DIAGNOSIS — Z91010 Allergy to peanuts: Secondary | ICD-10-CM

## 2023-04-02 DIAGNOSIS — J301 Allergic rhinitis due to pollen: Secondary | ICD-10-CM

## 2023-04-02 MED ORDER — HYDROCHLOROTHIAZIDE 25 MG PO TABS: 25.0000 mg | ORAL_TABLET | Freq: Every day | ORAL | 1 refills | Status: DC

## 2023-04-02 MED ORDER — ALBUTEROL SULFATE HFA 108 (90 BASE) MCG/ACT IN AERS: INHALATION_SPRAY | RESPIRATORY_TRACT | 11 refills | Status: DC

## 2023-04-02 MED ORDER — ALBUTEROL SULFATE (2.5 MG/3ML) 0.083% IN NEBU: 2.5000 mg | INHALATION_SOLUTION | Freq: Four times a day (QID) | RESPIRATORY_TRACT | 1 refills | Status: DC | PRN

## 2023-04-02 MED ORDER — ESCITALOPRAM OXALATE 20 MG PO TABS: 20.0000 mg | ORAL_TABLET | Freq: Every day | ORAL | 1 refills | Status: DC

## 2023-04-02 MED ORDER — MONTELUKAST SODIUM 10 MG PO TABS: ORAL_TABLET | ORAL | 1 refills | Status: DC

## 2023-04-02 MED ORDER — BUDESONIDE 32 MCG/ACT NA SUSP: 2.0000 | Freq: Every day | NASAL | 11 refills | Status: DC

## 2023-04-02 MED ORDER — EPINEPHRINE 0.3 MG/0.3ML IJ SOAJ: 0.3000 mg | INTRAMUSCULAR | 5 refills | Status: DC | PRN

## 2023-04-02 MED ORDER — FLUTICASONE-SALMETEROL 500-50 MCG/ACT IN AEPB: INHALATION_SPRAY | RESPIRATORY_TRACT | 1 refills | Status: DC

## 2023-04-02 NOTE — Progress Notes (Signed)
Established patient visit  History, exam, impression, and plan:  1. Essential hypertension Very pleasant 47 year old female presenting today for follow-up on hypertension.  She has been checking blood pressures at home and reports readings are usually good.  She checks her blood pressure every day to determine whether she should take her HCTZ or not.  He is using this on a as needed basis and seems to work well for her.  Denies side effects or concerning symptoms.  See below for physical exam.  Continue HCTZ 25 mg daily as needed. - hydrochlorothiazide (HYDRODIURIL) 25 MG tablet; Take 1 tablet (25 mg total) by mouth daily.  Dispense: 90 tablet; Refill: 1  2. Asthma, chronic, severe persistent, uncomplicated She is currently being followed by pulmonology.  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  Reports that she is currently on Singulair, Wixela, and Spiriva.  Using as needed albuterol/Ventolin.  Taking Zyrtec at night.  Has some baseline shortness of breath but no recent exacerbations.  Requesting refills.  Continue current medications.  Follow-up with pulmonology as instructed. - albuterol (PROVENTIL) (2.5 MG/3ML) 0.083% nebulizer solution; Take 3 mLs (2.5 mg total) by nebulization every 6 (six) hours as needed for wheezing or shortness of breath.  Dispense: 150 mL; Refill: 1 - albuterol (VENTOLIN HFA) 108 (90 Base) MCG/ACT inhaler; INHALE 2 PUFFS BY MOUTH EVERY 4 HOURS AS NEEDED FOR WHEEZING - VENTOLIN DAW (PT SYMPTOMS BETTER CONTROLLED W/ BRAND NAME)  Dispense: 18 each; Refill: 11 - budesonide (RHINOCORT AQUA) 32 MCG/ACT nasal spray; Place 2 sprays into both nostrils daily.  Dispense: 8.6 g; Refill: 11 - montelukast (SINGULAIR) 10 MG tablet; TAKE 1 TABLET BY MOUTH EVERYDAY AT BEDTIME  Dispense: 90 tablet; Refill: 1 - fluticasone-salmeterol (WIXELA INHUB) 500-50 MCG/ACT AEPB;  INHALE 1 PUFF BY MOUTH TWICE A DAY  Dispense: 180 each; Refill: 1  4. Anxiety and depression Taking Lexapro 20 mg daily, tolerating well without side effects.  Is under quite a bit of stress in multiple areas of her life.  Having marital issues and is not sure if the medication is truly working well for her or not at this point.  Denies SI/HI.  [facility] but would like to [facility] them more frequently.  Unfortunately there scheduling does not allow this.  Briefly reviewed options for making medication changes but with her current parasitic infection and treatment, would like to avoid adding additional medications.  Offered referral to counseling outside of the [place] but not sure if insurance would cover this as well as her current [place].  For now, continue Lexapro 20 mg daily.  Advised her to let me know if she would like to make any changes or would like a referral. - escitalopram (LEXAPRO) 20 MG tablet; Take 1 tablet (20 mg total) by mouth daily.  Dispense: 90 tablet; Refill: 1  5. Food allergy, peanut Notable for peanut food allergy with anaphylactic reaction.  Reordering EpiPen.  Has had some close calls over the last year but has not had to use 1 in the last 12 months. - EPINEPHrine (EPIPEN 2-PAK) 0.3 mg/0.3 mL IJ SOAJ injection; Inject 0.3 mg into the muscle as needed for anaphylaxis.  Dispense: 2 each; Refill: 5  6. Non-seasonal allergic rhinitis due to pollen Continue antihistamines as noted above. - budesonide (RHINOCORT AQUA)  32 MCG/ACT nasal spray; Place 2 sprays into both nostrils daily.  Dispense: 8.6 g; Refill: 11 - montelukast (SINGULAIR) 10 MG tablet; TAKE 1 TABLET BY MOUTH EVERYDAY AT BEDTIME  Dispense: 90 tablet; Refill: 1  Of note: Patient works at the [employer] and is having significant difficulty with the required foot wear.  They mandate the use of leather shoes however after wearing the foot wear as instructed, she has had multiple use  with foot pain and irritation.  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  Had significant skin irritation as well as blisters on her heels when wearing the shoes.  Also has fallen arches and requires custom orthotics which are not able to be used in the leather shoes.  Today, they are refusing to allow a note from the urgent care provider that she should be exempt from the shoe requirement.  She has a meeting this afternoon to discuss further.  I did advise her that I will be glad to fill out accommodation forms for her regarding the footwear recommendation.  She will let me know if this is needed.  Discussed upcoming renewal of FMLA.  We will plan to keep the same parameters as this has been helpful for her in the past.  Once forms are available, we will get them completed at her earliest convenience. Procedures performed this visit: None.  Return in about 6 months (around 10/02/2023) for chronic disease follow up.  __________________________________ Thayer Ohm, DNP, APRN, FNP-BC Primary Care and Sports Medicine [facility] [place]

## 2023-04-03 ENCOUNTER — Ambulatory Visit: Payer: Federal, State, Local not specified - PPO | Admitting: Medical-Surgical

## 2023-05-05 DIAGNOSIS — B351 Tinea unguium: Secondary | ICD-10-CM | POA: Diagnosis not present

## 2023-05-05 DIAGNOSIS — M2012 Hallux valgus (acquired), left foot: Secondary | ICD-10-CM | POA: Diagnosis not present

## 2023-05-05 DIAGNOSIS — M2141 Flat foot [pes planus] (acquired), right foot: Secondary | ICD-10-CM | POA: Diagnosis not present

## 2023-05-05 DIAGNOSIS — M2142 Flat foot [pes planus] (acquired), left foot: Secondary | ICD-10-CM | POA: Diagnosis not present

## 2023-05-07 ENCOUNTER — Other Ambulatory Visit: Payer: Self-pay | Admitting: Medical-Surgical

## 2023-05-10 ENCOUNTER — Encounter: Payer: Self-pay | Admitting: Medical-Surgical

## 2023-05-10 DIAGNOSIS — F419 Anxiety disorder, unspecified: Secondary | ICD-10-CM

## 2023-05-11 DIAGNOSIS — B351 Tinea unguium: Secondary | ICD-10-CM | POA: Insufficient documentation

## 2023-05-20 ENCOUNTER — Encounter: Payer: Self-pay | Admitting: Medical-Surgical

## 2023-05-29 ENCOUNTER — Encounter: Payer: Self-pay | Admitting: Family Medicine

## 2023-05-29 ENCOUNTER — Other Ambulatory Visit: Payer: Self-pay

## 2023-05-29 ENCOUNTER — Ambulatory Visit
Admission: EM | Admit: 2023-05-29 | Discharge: 2023-05-29 | Disposition: A | Discharge status: Home / Self Care | Payer: No Typology Code available for payment source | Attending: Family Medicine | Admitting: Family Medicine

## 2023-05-29 DIAGNOSIS — J4550 Severe persistent asthma, uncomplicated: Secondary | ICD-10-CM | POA: Diagnosis not present

## 2023-05-29 DIAGNOSIS — H9201 Otalgia, right ear: Secondary | ICD-10-CM | POA: Diagnosis not present

## 2023-05-29 DIAGNOSIS — H6993 Unspecified Eustachian tube disorder, bilateral: Secondary | ICD-10-CM

## 2023-05-29 MED ORDER — PREDNISONE 10 MG (21) PO TBPK: ORAL_TABLET | Freq: Every day | ORAL | 0 refills | Status: DC

## 2023-05-29 NOTE — ED Triage Notes (Signed)
C/o asthma flare x3 days - has been using nebulizer every 4 hours. Also has right ear pain and nausea and vomiting x 1.

## 2023-05-29 NOTE — Discharge Instructions (Addendum)
 Advised patient to take medication as directed with food to completion.  Encouraged to increase daily water intake to 64 ounces per day while taking this medication.  Advised if symptoms worsen and/or unresolved please follow-up PCP or here for further evaluation.

## 2023-05-29 NOTE — ED Provider Notes (Signed)
[facility]    CSN: [id number] Arrival date & time: 05/29/23  1306      History   Chief Complaint Chief Complaint  Patient presents with   Otalgia    HPI Toni Harmon is a 47 y.o. female.   HPI 47 year old female presents with asthma flare for 3 days.  Reports using nebulizer every 4 hours also complains of right ear pain and nausea x 1.  PMH significant for asthma, chronic, severe persistent, HTN, and lung nodule.  Patient request work note for Wednesday, Thursday and today Friday of this week returning tomorrow Saturday, 05/30/2023.  Past Medical History:  Diagnosis Date   Asthma    GERD (gastroesophageal reflux disease)    Hypertension    only to combat the effects of hctz    Patient Active Problem List   Diagnosis Date Noted   Tinea unguium 05/11/2023   GERD (gastroesophageal reflux disease) 03/28/2022   Allergic reaction to food 07/20/2020   Asthma, chronic, severe persistent, uncomplicated 04/25/2020   Abnormal findings in stool 02/05/2020   Lung nodule < 6cm on CT 08/12/2018   HLD (hyperlipidemia) 12/28/2017   Family history of cervical cancer 12/25/2017   Vitamin D deficiency 09/02/2016   Iron deficiency anemia 09/02/2016   Essential hypertension 11/16/2012   Moderate persistent asthma 11/16/2012   Degeneration of cervical intervertebral disc 11/16/2012   Allergic rhinitis 11/16/2012   IBS (irritable bowel syndrome) 11/16/2012   Anxiety and depression 11/16/2012   Food allergy, peanut 08/18/2012   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 Authorizing Provider  predniSONE (STERAPRED UNI-PAK 21 TAB) 10 MG (21) TBPK tablet Take by mouth daily. Take 6 tabs by mouth daily  for 2 days, then 5 tabs for 2 days, then 4 tabs for 2 days, then 3 tabs for 2 days, 2 tabs for 2 days, then 1 tab by mouth daily for 2 days 05/29/23  Yes Trevor Iha, FNP  albuterol (PROVENTIL) (2.5 MG/3ML) 0.083% nebulizer solution Take 3 mLs (2.5 mg total) by nebulization every 6 (six) hours as needed for wheezing or shortness of breath. 04/02/23   [name], NP  albuterol (VENTOLIN HFA) 108 (90 Base) MCG/ACT inhaler INHALE 2 PUFFS BY MOUTH EVERY 4 HOURS AS NEEDED FOR WHEEZING - VENTOLIN DAW (PT SYMPTOMS BETTER CONTROLLED W/ BRAND NAME) 04/02/23   [name], NP  AMBULATORY NON FORMULARY MEDICATION Nebulizer with necessary accessories.  Dx: Moderate Persistent Asthma 10/26/13   Hommel, Sean, DO  budesonide (RHINOCORT AQUA) 32 MCG/ACT nasal spray Place 2 sprays into both nostrils daily. 04/02/23   [name], NP  cetirizine HCl (ZYRTEC) 5 MG/5ML SOLN Take 2.5 mg by mouth daily. Patient takes  1/4 teaspoon at bedtime.    [provider]  Choriogonadotropin Alfa 250 MCG/0.5ML injection  09/13/20   [provider]  EPINEPHrine (EPIPEN 2-PAK) 0.3 mg/0.3 mL IJ SOAJ injection Inject 0.3 mg into the muscle as needed for anaphylaxis. 04/02/23   [name], NP  escitalopram (LEXAPRO) 20 MG tablet Take 1 tablet (20 mg total) by mouth daily. 04/02/23   [name], NP  fluticasone-salmeterol (WIXELA INHUB) 500-50 MCG/ACT AEPB INHALE 1 PUFF BY MOUTH TWICE A DAY 04/02/23   [name], NP  hydrochlorothiazide (HYDRODIURIL) 25 MG tablet Take 1 tablet (25 mg total) by mouth daily. 04/02/23   [name], NP  ibuprofen (ADVIL,MOTRIN) 600 MG tablet 2 (two) times daily as needed. 12/10/16   [provider]  ipratropium (ATROVENT) 0.06 % nasal spray SPRAY 2 SPRAYS INTO EACH NOSTRIL 4X DAILY AS NEEDED FOR SINUS OR EAR CONGESTION/POSTNASAL  DRIP 05/07/23   [name], NP  letrozole [facility]) 2.5 MG tablet Take by mouth. 03/17/22   [provider]  loratadine (CLARITIN) 10 MG tablet Take 1 tablet (10 mg total) by mouth daily. 11/08/15   Hommel, [name], DO  montelukast (SINGULAIR) 10 MG tablet TAKE 1 TABLET BY MOUTH EVERYDAY AT BEDTIME 04/02/23   [name], NP  NON FORMULARY daily.    [provider]  NP THYROID 15 MG tablet Take 45 mg by mouth every morning. 02/28/22   [provider]  NP THYROID 30 MG tablet Take 30 mg by mouth daily. 03/16/22   [provider]  Potassium 99 MG TABS Take by mouth daily.    [provider]  Prenatal Vit-Fe Fumarate-FA (PRENATAL MULTIVITAMIN) TABS tablet Take 1 tablet by mouth daily at 12 noon.    [provider]  Spacer/Aero-Holding Chambers (AEROCHAMBER PLUS) inhaler Use as instructed 01/16/20   [name], DO  terbinafine (LAMISIL) 250 MG tablet Take by mouth. 05/05/23   [provider]  Tiotropium Bromide Monohydrate (SPIRIVA RESPIMAT) 1.25 MCG/ACT AERS Inhale 2 puffs into the lungs daily. 02/02/23   [name], MD  vitamin C (ASCORBIC ACID) 500 MG tablet Take 500 mg by mouth daily.    [provider]    Family History Family History  Problem Relation Age of Onset   Cervical cancer Mother 25   Hypertension Mother    Food Allergy Mother    Cancer Mother    Diabetes Maternal Grandfather     Social History Social History   Tobacco Use   Smoking status: Never   Smokeless tobacco: Never  Vaping Use   Vaping status: Never Used  Substance Use Topics   Alcohol use: No    Alcohol/week: 0.0 standard drinks of alcohol   Drug use: No     Allergies   Compazine [prochlorperazine edisylate], Corn-containing products, Peanut-containing drug products, Penicillins, Ppd [tuberculin purified protein derivative], Sesame oil, Soybean oil, Egg-derived products, Nitrous oxide, Other, Shellfish allergy, Dust mite extract, Food,  Mixed ragweed, Molds & smuts, Peanut oil, Pollen extract, Prochlorperazine, Tetanus toxoids, and Latex   Review of Systems Review of Systems  HENT:  Positive for ear pain.   Respiratory:  Positive for shortness of breath.      Physical Exam Triage Vital Signs ED Triage Vitals  Encounter Vitals Group     BP 05/29/23 1313 118/81     Systolic BP Percentile --      Diastolic BP Percentile --      Pulse Rate 05/29/23 1313 82     Resp 05/29/23 1313 16     Temp 05/29/23  1313 98 F (36.7 C)     Temp Source 05/29/23 1313 Oral     SpO2 05/29/23 1313 98 %     Weight --      Height --      Head Circumference --      Peak Flow --      Pain Score 05/29/23 1316 0     Pain Loc --      Pain Education --      Exclude from Growth Chart --    No data found.  Updated Vital Signs BP 118/81   Pulse 82   Temp 98 F (36.7 C) (Oral)   Resp 16   SpO2 98%    Physical Exam Vitals and nursing note reviewed.  Constitutional:      Appearance: Normal appearance. She is normal weight. She is ill-appearing.  HENT:     Head: Normocephalic and atraumatic.     Right Ear: Tympanic membrane and external ear normal.     Left Ear: Tympanic membrane and external ear normal.     Ears:     Comments: Moderate eustachian tube dysfunction noted bilaterally    Mouth/Throat:     Mouth: Mucous membranes are moist.     Pharynx: Oropharynx is clear.  Eyes:     Extraocular Movements: Extraocular movements intact.     Conjunctiva/sclera: Conjunctivae normal.     Pupils: Pupils are equal, round, and reactive to light.  Cardiovascular:     Rate and Rhythm: Normal rate and regular rhythm.     Pulses: Normal pulses.     Heart sounds: Normal heart sounds.  Pulmonary:     Effort: Pulmonary effort is normal.     Breath sounds: Normal breath sounds. No wheezing, rhonchi or rales.  Musculoskeletal:        General: Normal range of motion.     Cervical back: Normal range of motion and neck supple.  Skin:     General: Skin is warm and dry.  Neurological:     General: No focal deficit present.     Mental Status: She is alert and oriented to person, place, and time.  Psychiatric:        Mood and Affect: Mood normal.        Behavior: Behavior normal.        Thought Content: Thought content normal.      UC Treatments / Results  Labs (all labs ordered are listed, but only abnormal results are displayed) Labs Reviewed - No data to display  EKG   Radiology No results found.  Procedures Procedures (including critical care time)  Medications Ordered in UC Medications - No data to display  Initial Impression / Assessment and Plan / UC Course  I have reviewed the triage vital signs and the nursing notes.  Pertinent labs & imaging results that were available during my care of the patient were reviewed by me and considered in my medical decision making (see chart for details).     MDM: 1.  Asthma, chronic, severe persistent, uncomplicated-Rx'd Sterapred Unipak (tapering from 60 mg to 10 mg over 10 days); 2.  Eustachian tube dysfunction, bilaterally-Rx'd Sterapred Unipak (tapering from 60 mg to 10 mg over 10 days. Advised patient to take medication as directed with food to completion.  Encouraged to increase daily water intake to 64 ounces per day while taking this medication.  Advised if symptoms worsen and/or unresolved please follow-up PCP or here for further evaluation.  Patient discharged home,  hemodynamically stable.  Work note for Wednesday, 05/27/2023 through today Friday, 05/29/23 provided to patient per her request.  Final Clinical Impressions(s) / UC Diagnoses   Final diagnoses:  Asthma, chronic, severe persistent, uncomplicated  ETD (Eustachian tube dysfunction), bilateral     Discharge Instructions      Advised patient to take medication as directed with food to completion.  Encouraged to increase daily water intake to 64 ounces per day while taking this medication.  Advised  if symptoms worsen and/or unresolved please follow-up PCP or here for further evaluation.     ED Prescriptions     Medication Sig Dispense Auth. Provider   predniSONE (STERAPRED UNI-PAK 21 TAB) 10 MG (21) TBPK tablet Take by mouth daily. Take 6 tabs by mouth daily  for 2 days, then 5 tabs for 2 days, then 4 tabs for 2 days, then 3 tabs for 2 days, 2 tabs for 2 days, then 1 tab by mouth daily for 2 days 42 tablet Trevor Iha, FNP      PDMP not reviewed this encounter.   Trevor Iha, FNP 05/29/23 1407

## 2023-06-16 DIAGNOSIS — B351 Tinea unguium: Secondary | ICD-10-CM | POA: Diagnosis not present

## 2023-06-16 DIAGNOSIS — M2141 Flat foot [pes planus] (acquired), right foot: Secondary | ICD-10-CM | POA: Diagnosis not present

## 2023-06-16 DIAGNOSIS — M2012 Hallux valgus (acquired), left foot: Secondary | ICD-10-CM | POA: Diagnosis not present

## 2023-06-16 DIAGNOSIS — M2011 Hallux valgus (acquired), right foot: Secondary | ICD-10-CM | POA: Diagnosis not present

## 2023-06-16 DIAGNOSIS — M2142 Flat foot [pes planus] (acquired), left foot: Secondary | ICD-10-CM | POA: Diagnosis not present

## 2023-07-13 ENCOUNTER — Ambulatory Visit (INDEPENDENT_AMBULATORY_CARE_PROVIDER_SITE_OTHER): Payer: Federal, State, Local not specified - PPO | Admitting: Pulmonary Disease

## 2023-07-13 DIAGNOSIS — J4540 Moderate persistent asthma, uncomplicated: Secondary | ICD-10-CM

## 2023-07-13 NOTE — Progress Notes (Signed)
Pt unable to perform test today. She was having a hard time expellng air out without coughing. She reports having to use all 3 inhalers this morning 45 mins before test (Spiriva, Advair and Ventolin). Patient was also c/o dizziness and didn't think she could complete the test.

## 2023-07-13 NOTE — Patient Instructions (Signed)
No PFT performed today due to excessive cough and dizziness.

## 2023-07-17 ENCOUNTER — Other Ambulatory Visit: Payer: Self-pay | Admitting: Medical-Surgical

## 2023-07-17 DIAGNOSIS — J301 Allergic rhinitis due to pollen: Secondary | ICD-10-CM

## 2023-07-17 DIAGNOSIS — J4550 Severe persistent asthma, uncomplicated: Secondary | ICD-10-CM

## 2023-08-08 ENCOUNTER — Other Ambulatory Visit: Payer: Self-pay | Admitting: Medical-Surgical

## 2023-09-15 ENCOUNTER — Ambulatory Visit: Payer: Federal, State, Local not specified - PPO | Admitting: Pulmonary Disease

## 2023-09-15 ENCOUNTER — Encounter: Payer: Self-pay | Admitting: Pulmonary Disease

## 2023-09-15 VITALS — BP 116/75 | HR 80 | Ht 66.0 in | Wt 148.2 lb

## 2023-09-15 DIAGNOSIS — J4540 Moderate persistent asthma, uncomplicated: Secondary | ICD-10-CM

## 2023-09-15 NOTE — Patient Instructions (Addendum)
Continue wixella 500-51mcg 1 puff twice daily  Continue Spiriva 2 puffs daily  Use albuterol inhaler or nebulizer treatments as needed  Continue singulair, we will plan stopping this once dupixent has been started  We will complete dupixent paper work today but wait for final approval once infection has cleared  Follow up in 6 months, call sooner if needed

## 2023-09-15 NOTE — Progress Notes (Signed)
Synopsis: Referred in Aprl 2024 for asthma by Edison Nasuti, MD  Subjective:   PATIENT ID: Toni Harmon GENDER: female DOB: [birth year], MRN: [id number]  HPI  Chief Complaint  Patient presents with   Follow-up    Pt was unable to complete pft, still on inhalers. States still having flare ups    Toni Harmon is a 47 year old woman, never smoker with history of GERD, hypertension and asthma who returns to pulmonary clinic for asthma.   Initial OV 02/02/23 She was diagnosed with asthma at 47 years old. She has significant allergies that bother her asthma. She has exercise induced symptoms. Strong perfumes and colognes can bother her breathing. She was on prednisone in February for her breathing. She has a lot of post nasal drip. She is taking pseudophed every day. She is using rhinocort nasal spray daily. She is using atrovent nasal spray as needed. No history of nose or sinus surgeries. She does have deviated septum. No history of nasal or sinus polyps. She is using Claritin, cetirizine and Singulair daily for allergies. She has seen an allergist in the past.   She has issues with heartburn and reflux. She was taking pepcid previously. She is eating 2-3 hours prior to bed time. She is sleeping elevated on her pillows. She does have base adjustable bed and sleeps elevated as well.   She does have night time awakenings with cough, wheezing and shortness of breath.   She reports over recent years she is taking prednisone taper at least 6 times per year.   She works at the [employer], works as Lexicographer. Never smoker. Some second hand smoke in childhood.   Today OV 09/15/23 She was unable to complete PFTs in October due to shortness of breath despite using her inhalers. Absolute eosinophile count at last visit was 100 and IgE 20.   The patient, with a history of asthma, presents with ongoing breathing difficulties. They report a recent flare-up that required an urgent care  visit. Despite being on Wixela 500 (one puff twice a day) and Spiriva (two puffs in the morning), they continue to experience symptoms. They also use Ventolin at work, approximately once a day. They report that cold air exacerbates their symptoms. They have been on prednisone multiple times in the past year and report nighttime awakenings due to cough and shortness of breath.  The patient is also currently being treated for a tapeworm infection with albendazole, under the care of an integrative medicine specialist. They have taken the medication three times so far and will continue until the infection is eradicated.  They express concerns about Singulair, which they have been on for a long time, due to recent black box warnings they have seen on TV. They note that they started experiencing anxiety, for which they take Lexapro, after starting Singulair.  The patient works at the [employer] and has been working long hours recently due to the holiday season. They report that they are able to sleep through the night only two or three nights a week, and using their inhaler at night often prevents them from going back to sleep.  Past Medical History:  Diagnosis Date   Asthma    GERD (gastroesophageal reflux disease)    Hypertension    only to combat the effects of hctz     Family History  Problem Relation Age of Onset   Cervical cancer Mother 39   Hypertension Mother    Food Allergy Mother  Cancer Mother    Diabetes Maternal Grandfather      Social History   Socioeconomic History   Marital status: Married    Spouse name: Not on file   Number of children: Not on file   Years of education: Not on file   Highest education level: Not on file  Occupational History   Not on file  Tobacco Use   Smoking status: Never   Smokeless tobacco: Never  Vaping Use   Vaping status: Never Used  Substance and Sexual Activity   Alcohol use: No    Alcohol/week: 0.0 standard drinks of alcohol   Drug  use: No   Sexual activity: Yes    Partners: Male  Other Topics Concern   Not on file  Social History Narrative   Not on file   Social Determinants of Health   Financial Resource Strain: Not on file  Food Insecurity: Not on file  Transportation Needs: Not on file  Physical Activity: Not on file  Stress: Not on file  Social Connections: Unknown (02/15/2022)   Received from [facility], [facility]   Social Network    Social Network: Not on file  Intimate Partner Violence: Unknown (01/09/2022)   Received from [facility], [facility]   HITS    Physically Hurt: Not on file    Insult or Talk Down To: Not on file    Threaten Physical Harm: Not on file    Scream or Curse: Not on file     Allergies  Allergen Reactions   Compazine [Prochlorperazine Edisylate] Other (See Comments)    seizure   Corn-Containing Products Hives, Diarrhea and Other (See Comments)   Peanut-Containing Drug Products Anaphylaxis    Other reaction(s): Lip swelling   Penicillins Anaphylaxis   Ppd [Tuberculin Purified Protein Derivative] Hives and Swelling    Patient stated,"can't receive the injection anymore per MD order. Must get the xray."   Sesame Oil Hives   Soybean Oil Anaphylaxis   Egg-Derived Products Nausea And Vomiting and Hives   Nitrous Oxide Nausea And Vomiting   Other Hives and Other (See Comments)    Tree nuts, melon, gluten intolerance Other reaction(s): Lip swelling   Shellfish Allergy Swelling   Dust Mite Extract Hives   Food    Mixed Ragweed Hives   Molds & Smuts Hives   Peanut Oil Swelling   Pollen Extract Hives   Prochlorperazine Other (See Comments)   Tetanus Toxoids Other (See Comments)    Per patient - she gets an adverse reaction to the vaccine.    Latex Hives and Rash     Outpatient Medications Prior to Visit  Medication Sig Dispense Refill   albuterol (PROVENTIL) (2.5 MG/3ML) 0.083% nebulizer solution Take 3 mLs (2.5 mg total) by nebulization every 6 (six) hours  as needed for wheezing or shortness of breath. 150 mL 1   albuterol (VENTOLIN HFA) 108 (90 Base) MCG/ACT inhaler INHALE 2 PUFFS BY MOUTH EVERY 4 HOURS AS NEEDED FOR WHEEZING - VENTOLIN DAW (PT SYMPTOMS BETTER CONTROLLED W/ BRAND NAME) 18 each 11   AMBULATORY NON FORMULARY MEDICATION Nebulizer with necessary accessories.  Dx: Moderate Persistent Asthma 1 Units 0   budesonide (RHINOCORT AQUA) 32 MCG/ACT nasal spray Place 2 sprays into both nostrils daily. 8.6 g 11   cetirizine HCl (ZYRTEC) 5 MG/5ML SOLN Take 2.5 mg by mouth daily. Patient takes  1/4 teaspoon at bedtime.     Choriogonadotropin Alfa 250 MCG/0.5ML injection      EPINEPHrine (EPIPEN  2-PAK) 0.3 mg/0.3 mL IJ SOAJ injection Inject 0.3 mg into the muscle as needed for anaphylaxis. 2 each 5   escitalopram (LEXAPRO) 20 MG tablet Take 1 tablet (20 mg total) by mouth daily. 90 tablet 1   fluticasone-salmeterol (WIXELA INHUB) 500-50 MCG/ACT AEPB INHALE 1 PUFF BY MOUTH TWICE A DAY 180 each 1   hydrochlorothiazide (HYDRODIURIL) 25 MG tablet Take 1 tablet (25 mg total) by mouth daily. 90 tablet 1   ibuprofen (ADVIL,MOTRIN) 600 MG tablet 2 (two) times daily as needed.  0   ipratropium (ATROVENT) 0.06 % nasal spray SPRAY 2 SPRAYS INTO EACH NOSTRIL 4X DAILY AS NEEDED FOR SINUS OR EAR CONGESTION/POSTNASAL DRIP 15 mL 1   letrozole (FEMARA) 2.5 MG tablet Take by mouth.     loratadine (CLARITIN) 10 MG tablet Take 1 tablet (10 mg total) by mouth daily. 90 tablet 1   montelukast (SINGULAIR) 10 MG tablet TAKE 1 TABLET BY MOUTH EVERYDAY AT BEDTIME 90 tablet 1   NON FORMULARY daily.     NP THYROID 15 MG tablet Take 45 mg by mouth every morning.     NP THYROID 30 MG tablet Take 30 mg by mouth daily.     Potassium 99 MG TABS Take by mouth daily.     Prenatal Vit-Fe Fumarate-FA (PRENATAL MULTIVITAMIN) TABS tablet Take 1 tablet by mouth daily at 12 noon.     Spacer/Aero-Holding Chambers (AEROCHAMBER PLUS) inhaler Use as instructed 1 each 2   terbinafine  (LAMISIL) 250 MG tablet Take by mouth.     Tiotropium Bromide Monohydrate (SPIRIVA RESPIMAT) 1.25 MCG/ACT AERS Inhale 2 puffs into the lungs daily. 4 g 11   vitamin C (ASCORBIC ACID) 500 MG tablet Take 500 mg by mouth daily.     predniSONE (STERAPRED UNI-PAK 21 TAB) 10 MG (21) TBPK tablet Take by mouth daily. Take 6 tabs by mouth daily  for 2 days, then 5 tabs for 2 days, then 4 tabs for 2 days, then 3 tabs for 2 days, 2 tabs for 2 days, then 1 tab by mouth daily for 2 days 42 tablet 0   No facility-administered medications prior to visit.   Review of Systems  Constitutional:  Negative for chills, fever, malaise/fatigue and weight loss.  HENT:  Positive for congestion. Negative for sinus pain and sore throat.   Eyes: Negative.   Respiratory:  Positive for shortness of breath. Negative for cough, hemoptysis, sputum production and wheezing.   Cardiovascular:  Negative for chest pain, palpitations, orthopnea, claudication and leg swelling.  Gastrointestinal:  Negative for abdominal pain, heartburn, nausea and vomiting.  Genitourinary: Negative.   Musculoskeletal:  Negative for joint pain and myalgias.  Skin:  Negative for rash.  Neurological:  Negative for weakness.  Endo/Heme/Allergies:  Positive for environmental allergies.  Psychiatric/Behavioral:  The patient is nervous/anxious.    Objective:   Vitals:   09/15/23 1403  BP: 116/75  Pulse: 80  SpO2: 100%  Weight: 148 lb 3.2 oz (67.2 kg)  Height: 5\' 6"  (1.676 m)     Physical Exam Constitutional:      General: She is not in acute distress.    Appearance: She is not ill-appearing.  HENT:     Head: Normocephalic and atraumatic.  Eyes:     Conjunctiva/sclera: Conjunctivae normal.  Cardiovascular:     Rate and Rhythm: Normal rate and regular rhythm.     Pulses: Normal pulses.     Heart sounds: Normal heart sounds. No murmur heard. Pulmonary:  Effort: Pulmonary effort is normal.     Breath sounds: Normal breath sounds. No  wheezing, rhonchi or rales.  Musculoskeletal:     Right lower leg: No edema.     Left lower leg: No edema.  Skin:    General: Skin is warm and dry.  Neurological:     General: No focal deficit present.     Mental Status: She is alert.    CBC    Component Value Date/Time   WBC 4.2 02/02/2023 0958   RBC 4.27 02/02/2023 0958   HGB 12.6 02/02/2023 0958   HGB 13.4 10/07/2018 0821   HCT 37.4 02/02/2023 0958   PLT 433.0 (H) 02/02/2023 0958   PLT 360 10/07/2018 0821   MCV 87.5 02/02/2023 0958   MCH 29.0 04/02/2022 0000   MCHC 33.6 02/02/2023 0958   RDW 14.1 02/02/2023 0958   LYMPHSABS 1.9 02/02/2023 0958   MONOABS 0.4 02/02/2023 0958   EOSABS 0.1 02/02/2023 0958   BASOSABS 0.0 02/02/2023 0958      Latest Ref Rng & Units 04/02/2022   12:00 AM 10/03/2021   12:00 AM 10/18/2020   10:23 AM  BMP  Glucose 65 - 99 mg/dL 78  72  72   BUN 7 - 25 mg/dL 16  9  24    Creatinine 0.50 - 0.99 mg/dL 1.02  7.25  3.66   BUN/Creat Ratio 6 - 22 (calc) NOT APPLICABLE  NOT APPLICABLE  NOT APPLICABLE   Sodium 135 - 146 mmol/L 139  143  140   Potassium 3.5 - 5.3 mmol/L 4.7  4.5  4.7   Chloride 98 - 110 mmol/L 102  105  100   CO2 20 - 32 mmol/L 29  32  28   Calcium 8.6 - 10.2 mg/dL 9.9  44.0  34.7    Chest imaging:  PFT:     No data to display          Labs:  Path:  Echo:  Heart Catheterization:    Assessment & Plan:   Moderate persistent asthma without complication  Discussion: Toni Harmon is a 47 year old woman, never smoker with history of GERD, hypertension and asthma who is referred to pulmonary clinic for asthma.   Asthma Frequent exacerbations requiring prednisone and urgent care visits. Nighttime awakenings due to cough and shortness of breath. Currently on Wixela 500 one puff twice a day, Spiriva two puffs in the morning, and Singulair. Patient has concerns about Singulair due to anxiety and recent black box warning. -Continue current inhaler therapy and Singulair  for now. -Plan to initiate Dupixent once patient's tapeworm infection is resolved. Patient to notify office when ready to proceed. -Consider reducing or discontinuing Singulair and potentially Spiriva once Dupixent is initiated.  Tapeworm Infection Currently being treated with albendazole under the care of Dr. Jeanann Lewandowsky at [facility]. -Continue current treatment plan as directed by Dr. Lyla Son.  Follow-up in 6 months or sooner if needed.  Melody Comas, MD [facility] Office: [phone]    Current Outpatient Medications:    albuterol (PROVENTIL) (2.5 MG/3ML) 0.083% nebulizer solution, Take 3 mLs (2.5 mg total) by nebulization every 6 (six) hours as needed for wheezing or shortness of breath., Disp: 150 mL, Rfl: 1   albuterol (VENTOLIN HFA) 108 (90 Base) MCG/ACT inhaler, INHALE 2 PUFFS BY MOUTH EVERY 4 HOURS AS NEEDED FOR WHEEZING - VENTOLIN DAW (PT SYMPTOMS BETTER CONTROLLED W/ BRAND NAME), Disp: 18 each, Rfl: 11   AMBULATORY  NON FORMULARY MEDICATION, Nebulizer with necessary accessories.  Dx: Moderate Persistent Asthma, Disp: 1 Units, Rfl: 0   budesonide (RHINOCORT AQUA) 32 MCG/ACT nasal spray, Place 2 sprays into both nostrils daily., Disp: 8.6 g, Rfl: 11   cetirizine HCl (ZYRTEC) 5 MG/5ML SOLN, Take 2.5 mg by mouth daily. Patient takes  1/4 teaspoon at bedtime., Disp: , Rfl:    Choriogonadotropin Alfa 250 MCG/0.5ML injection, , Disp: , Rfl:    EPINEPHrine (EPIPEN 2-PAK) 0.3 mg/0.3 mL IJ SOAJ injection, Inject 0.3 mg into the muscle as needed for anaphylaxis., Disp: 2 each, Rfl: 5   escitalopram (LEXAPRO) 20 MG tablet, Take 1 tablet (20 mg total) by mouth daily., Disp: 90 tablet, Rfl: 1   fluticasone-salmeterol (WIXELA INHUB) 500-50 MCG/ACT AEPB, INHALE 1 PUFF BY MOUTH TWICE A DAY, Disp: 180 each, Rfl: 1   hydrochlorothiazide (HYDRODIURIL) 25 MG tablet, Take 1 tablet (25 mg total) by mouth daily., Disp: 90 tablet, Rfl: 1   ibuprofen  (ADVIL,MOTRIN) 600 MG tablet, 2 (two) times daily as needed., Disp: , Rfl: 0   ipratropium (ATROVENT) 0.06 % nasal spray, SPRAY 2 SPRAYS INTO EACH NOSTRIL 4X DAILY AS NEEDED FOR SINUS OR EAR CONGESTION/POSTNASAL DRIP, Disp: 15 mL, Rfl: 1   letrozole (FEMARA) 2.5 MG tablet, Take by mouth., Disp: , Rfl:    loratadine (CLARITIN) 10 MG tablet, Take 1 tablet (10 mg total) by mouth daily., Disp: 90 tablet, Rfl: 1   montelukast (SINGULAIR) 10 MG tablet, TAKE 1 TABLET BY MOUTH EVERYDAY AT BEDTIME, Disp: 90 tablet, Rfl: 1   NON FORMULARY, daily., Disp: , Rfl:    NP THYROID 15 MG tablet, Take 45 mg by mouth every morning., Disp: , Rfl:    NP THYROID 30 MG tablet, Take 30 mg by mouth daily., Disp: , Rfl:    Potassium 99 MG TABS, Take by mouth daily., Disp: , Rfl:    Prenatal Vit-Fe Fumarate-FA (PRENATAL MULTIVITAMIN) TABS tablet, Take 1 tablet by mouth daily at 12 noon., Disp: , Rfl:    Spacer/Aero-Holding Chambers (AEROCHAMBER PLUS) inhaler, Use as instructed, Disp: 1 each, Rfl: 2   terbinafine (LAMISIL) 250 MG tablet, Take by mouth., Disp: , Rfl:    Tiotropium Bromide Monohydrate (SPIRIVA RESPIMAT) 1.25 MCG/ACT AERS, Inhale 2 puffs into the lungs daily., Disp: 4 g, Rfl: 11   vitamin C (ASCORBIC ACID) 500 MG tablet, Take 500 mg by mouth daily., Disp: , Rfl:

## 2023-09-24 ENCOUNTER — Ambulatory Visit
Admission: RE | Admit: 2023-09-24 | Discharge: 2023-09-24 | Disposition: A | Discharge status: Home / Self Care | Payer: Federal, State, Local not specified - PPO | Source: Ambulatory Visit | Attending: Family Medicine | Admitting: Family Medicine

## 2023-09-24 VITALS — BP 138/85 | HR 89 | Temp 97.7°F | Resp 18

## 2023-09-24 DIAGNOSIS — J4541 Moderate persistent asthma with (acute) exacerbation: Secondary | ICD-10-CM

## 2023-09-24 MED ORDER — PREDNISONE 10 MG (21) PO TBPK: ORAL_TABLET | Freq: Every day | ORAL | 0 refills | Status: DC

## 2023-09-24 NOTE — ED Triage Notes (Signed)
Pt c/o asthma flare up since Sunday. Called out sick Sunday. Intermittent cough which rattles at times. Nebulizer and inhaler prn. States she usually gets prednisone for these types of flare ups.

## 2023-09-24 NOTE — ED Provider Notes (Signed)
[facility]    CSN: [id number] Arrival date & time: 09/24/23  1336      History   Chief Complaint Chief Complaint  Patient presents with   Letter for School/Work   Asthma    HPI Toni Harmon is a 47 y.o. female.   HPI pleasant 47 year old female patient presents with possible asthma flareup and request for letter for school/work.  PMH significant for HTN, asthma, and GERD.  Past Medical History:  Diagnosis Date   Asthma    GERD (gastroesophageal reflux disease)    Hypertension    only to combat the effects of hctz    Patient Active Problem List   Diagnosis Date Noted   Tinea unguium 05/11/2023   GERD (gastroesophageal reflux disease) 03/28/2022   Allergic reaction to food 07/20/2020   Asthma, chronic, severe persistent, uncomplicated 04/25/2020   Abnormal findings in stool 02/05/2020   Lung nodule < 6cm on CT 08/12/2018   HLD (hyperlipidemia) 12/28/2017   Family history of cervical cancer 12/25/2017   Vitamin D deficiency 09/02/2016   Iron deficiency anemia 09/02/2016   Essential hypertension 11/16/2012   Moderate persistent asthma 11/16/2012   Degeneration of cervical intervertebral disc 11/16/2012   Allergic rhinitis 11/16/2012   IBS (irritable bowel syndrome) 11/16/2012   Anxiety and depression 11/16/2012   Food allergy, peanut 08/18/2012   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 Authorizing Provider  predniSONE (STERAPRED UNI-PAK 21 TAB) 10 MG (21) TBPK tablet Take by mouth daily. Take 6 tabs by mouth daily  for  2 days, then 5 tabs for 2 days, then 4 tabs for 2 days, then 3 tabs for 2 days, 2 tabs for 2 days, then 1 tab by mouth daily for 2 days 09/24/23  Yes Trevor Iha, FNP  albuterol (PROVENTIL) (2.5 MG/3ML) 0.083% nebulizer solution Take 3 mLs (2.5 mg total) by nebulization every 6 (six) hours as needed for wheezing or shortness of breath. 04/02/23   [name], NP  albuterol (VENTOLIN HFA) 108 (90 Base) MCG/ACT inhaler INHALE 2 PUFFS BY MOUTH EVERY 4 HOURS AS NEEDED FOR WHEEZING - VENTOLIN DAW (PT SYMPTOMS BETTER CONTROLLED W/ BRAND NAME) 04/02/23   [name], NP  AMBULATORY NON FORMULARY MEDICATION Nebulizer with necessary accessories.  Dx: Moderate Persistent Asthma 10/26/13   Hommel, Sean, DO  budesonide (RHINOCORT AQUA) 32 MCG/ACT nasal spray Place 2 sprays into both nostrils daily. 04/02/23   [name], NP  cetirizine HCl (ZYRTEC) 5 MG/5ML SOLN Take 2.5 mg by mouth daily. Patient takes  1/4 teaspoon at bedtime.    [provider]  Choriogonadotropin Alfa 250 MCG/0.5ML injection  09/13/20   [provider]  EPINEPHrine (EPIPEN 2-PAK) 0.3 mg/0.3 mL IJ SOAJ injection Inject 0.3 mg into  the muscle as needed for anaphylaxis. 04/02/23   [name], NP  escitalopram (LEXAPRO) 20 MG tablet Take 1 tablet (20 mg total) by mouth daily. 04/02/23   [name], NP  fluticasone-salmeterol (WIXELA INHUB) 500-50 MCG/ACT AEPB INHALE 1 PUFF BY MOUTH TWICE A DAY 04/02/23   [name], NP  hydrochlorothiazide (HYDRODIURIL) 25 MG tablet Take 1 tablet (25 mg total) by mouth daily. 04/02/23   [name], NP  ibuprofen (ADVIL,MOTRIN) 600 MG tablet 2 (two) times daily as needed. 12/10/16   [provider]  ipratropium (ATROVENT) 0.06 % nasal spray SPRAY 2 SPRAYS INTO EACH NOSTRIL 4X DAILY AS NEEDED FOR SINUS OR EAR CONGESTION/POSTNASAL DRIP 08/11/23   [name], NP  letrozole [facility]) 2.5 MG tablet Take by mouth. 03/17/22   [provider]  loratadine (CLARITIN) 10 MG tablet Take 1 tablet  (10 mg total) by mouth daily. 11/08/15   Hommel, [name], DO  montelukast (SINGULAIR) 10 MG tablet TAKE 1 TABLET BY MOUTH EVERYDAY AT BEDTIME 04/02/23   [name], NP  NON FORMULARY daily.    [provider]  NP THYROID 15 MG tablet Take 45 mg by mouth every morning. 02/28/22   [provider]  NP THYROID 30 MG tablet Take 30 mg by mouth daily. 03/16/22   [provider]  Potassium 99 MG TABS Take by mouth daily.    [provider]  Prenatal Vit-Fe Fumarate-FA (PRENATAL MULTIVITAMIN) TABS tablet Take 1 tablet by mouth daily at 12 noon.    [provider]  Spacer/Aero-Holding Chambers (AEROCHAMBER PLUS) inhaler Use as instructed 01/16/20   [name], DO  terbinafine (LAMISIL) 250 MG tablet Take by mouth. 05/05/23   [provider]  Tiotropium Bromide Monohydrate (SPIRIVA RESPIMAT) 1.25 MCG/ACT AERS Inhale 2 puffs into the lungs daily. 02/02/23   [name], MD  vitamin C (ASCORBIC ACID) 500 MG tablet Take 500 mg by mouth daily.    [provider]    Family History Family History  Problem Relation Age of Onset   Cervical cancer Mother 62   Hypertension Mother    Food Allergy Mother    Cancer Mother    Diabetes Maternal Grandfather     Social History Social History   Tobacco Use   Smoking status: Never   Smokeless tobacco: Never  Vaping Use   Vaping status: Never Used  Substance Use Topics   Alcohol use: No    Alcohol/week: 0.0 standard drinks of alcohol   Drug use: No     Allergies   Compazine [prochlorperazine edisylate], Corn-containing products, Peanut-containing drug products, Penicillins, Ppd [tuberculin purified protein derivative], Sesame oil, Soybean oil, Egg-derived products, Nitrous oxide, Other, Shellfish allergy, Dust mite extract, Food, Mixed ragweed, Molds & smuts, Peanut oil, Pollen extract, Prochlorperazine, Tetanus toxoids, and Latex   Review of Systems Review of Systems  Respiratory:   Positive for cough.   All other systems reviewed and are negative.    Physical Exam Triage Vital Signs ED Triage Vitals  Encounter Vitals Group     BP      Systolic BP Percentile      Diastolic BP Percentile      Pulse      Resp      Temp      Temp src      SpO2      Weight      Height      Head Circumference      Peak Flow  Pain Score      Pain Loc      Pain Education      Exclude from Growth Chart    No data found.  Updated Vital Signs BP 138/85 (BP Location: Left Arm)   Pulse 89   Temp 97.7 F (36.5 C) (Oral)   Resp 18   SpO2 100%    Physical Exam Vitals and nursing note reviewed.  Constitutional:      Appearance: Normal appearance. She is normal weight.  HENT:     Head: Normocephalic and atraumatic.     Right Ear: Tympanic membrane, ear canal and external ear normal.     Left Ear: Tympanic membrane, ear canal and external ear normal.     Nose: Nose normal.     Mouth/Throat:     Mouth: Mucous membranes are moist.     Pharynx: Oropharynx is clear.  Eyes:     Extraocular Movements: Extraocular movements intact.     Conjunctiva/sclera: Conjunctivae normal.     Pupils: Pupils are equal, round, and reactive to light.  Cardiovascular:     Rate and Rhythm: Normal rate and regular rhythm.     Pulses: Normal pulses.     Heart sounds: Normal heart sounds.  Pulmonary:     Effort: Pulmonary effort is normal.     Breath sounds: Normal breath sounds. No wheezing, rhonchi or rales.  Musculoskeletal:        General: Normal range of motion.     Cervical back: Normal range of motion and neck supple.  Skin:    General: Skin is warm and dry.  Neurological:     General: No focal deficit present.     Mental Status: She is alert and oriented to person, place, and time. Mental status is at baseline.  Psychiatric:        Mood and Affect: Mood normal.        Behavior: Behavior normal.      UC Treatments / Results  Labs (all labs ordered are listed, but only  abnormal results are displayed) Labs Reviewed - No data to display  EKG   Radiology No results found.  Procedures Procedures (including critical care time)  Medications Ordered in UC Medications - No data to display  Initial Impression / Assessment and Plan / UC Course  I have reviewed the triage vital signs and the nursing notes.  Pertinent labs & imaging results that were available during my care of the patient were reviewed by me and considered in my medical decision making (see chart for details).     MDM: 1.  Moderate persistent asthma with exacerbation-Rx'd Sterapred Unipak (tapering from 60 mg to 10 mg over 10 days). Advised patient to take medication as directed with food to completion.  Encouraged increase daily water intake to 64 ounces per day while taking these medications.  Advised if symptoms worsen and/or unresolved please follow-up PCP or here for further evaluation.  Patient discharged home, hemodynamically stable.  Note provided per patient request. Final Clinical Impressions(s) / UC Diagnoses   Final diagnoses:  Moderate persistent asthma with exacerbation     Discharge Instructions      Advised patient to take medication as directed with food to completion.  Encouraged increase daily water intake to 64 ounces per day while taking these medications.  Advised if symptoms worsen and/or unresolved please follow-up PCP or here for further evaluation.     ED Prescriptions     Medication Sig Dispense Auth. Provider  predniSONE (STERAPRED UNI-PAK 21 TAB) 10 MG (21) TBPK tablet Take by mouth daily. Take 6 tabs by mouth daily  for 2 days, then 5 tabs for 2 days, then 4 tabs for 2 days, then 3 tabs for 2 days, 2 tabs for 2 days, then 1 tab by mouth daily for 2 days 42 tablet Trevor Iha, FNP      PDMP not reviewed this encounter.   Trevor Iha, FNP 09/24/23 1408

## 2023-09-24 NOTE — Discharge Instructions (Addendum)
Advised patient to take medication as directed with food to completion.  Encouraged increase daily water intake to 64 ounces per day while taking these medications.  Advised if symptoms worsen and/or unresolved please follow-up PCP or here for further evaluation.

## 2023-10-02 ENCOUNTER — Encounter: Payer: Self-pay | Admitting: Medical-Surgical

## 2023-10-02 ENCOUNTER — Ambulatory Visit: Payer: Federal, State, Local not specified - PPO | Admitting: Medical-Surgical

## 2023-10-02 VITALS — BP 107/73 | HR 81 | Resp 20 | Ht 66.0 in | Wt 148.3 lb

## 2023-10-02 DIAGNOSIS — F32A Depression, unspecified: Secondary | ICD-10-CM

## 2023-10-02 DIAGNOSIS — F419 Anxiety disorder, unspecified: Secondary | ICD-10-CM

## 2023-10-02 DIAGNOSIS — I10 Essential (primary) hypertension: Secondary | ICD-10-CM

## 2023-10-02 DIAGNOSIS — F322 Major depressive disorder, single episode, severe without psychotic features: Secondary | ICD-10-CM | POA: Insufficient documentation

## 2023-10-02 DIAGNOSIS — J301 Allergic rhinitis due to pollen: Secondary | ICD-10-CM

## 2023-10-02 DIAGNOSIS — J4550 Severe persistent asthma, uncomplicated: Secondary | ICD-10-CM | POA: Diagnosis not present

## 2023-10-02 MED ORDER — HYDROCHLOROTHIAZIDE 25 MG PO TABS: 25.0000 mg | ORAL_TABLET | Freq: Every day | ORAL | 1 refills | Status: DC

## 2023-10-02 MED ORDER — ALBUTEROL SULFATE HFA 108 (90 BASE) MCG/ACT IN AERS: INHALATION_SPRAY | RESPIRATORY_TRACT | 11 refills | Status: DC

## 2023-10-02 MED ORDER — FLUTICASONE-SALMETEROL 500-50 MCG/ACT IN AEPB: INHALATION_SPRAY | RESPIRATORY_TRACT | 1 refills | Status: DC

## 2023-10-02 MED ORDER — ALBUTEROL SULFATE (2.5 MG/3ML) 0.083% IN NEBU: 2.5000 mg | INHALATION_SOLUTION | Freq: Four times a day (QID) | RESPIRATORY_TRACT | 1 refills | Status: DC | PRN

## 2023-10-02 MED ORDER — IPRATROPIUM BROMIDE 0.06 % NA SOLN: NASAL | 1 refills | Status: DC

## 2023-10-02 MED ORDER — MONTELUKAST SODIUM 10 MG PO TABS: ORAL_TABLET | ORAL | 1 refills | Status: DC

## 2023-10-02 MED ORDER — ESCITALOPRAM OXALATE 20 MG PO TABS: 20.0000 mg | ORAL_TABLET | Freq: Every day | ORAL | 1 refills | Status: DC

## 2023-10-02 NOTE — Progress Notes (Signed)
Established patient visit  History, exam, impression, and plan:  1. Asthma, chronic, severe persistent, uncomplicated Very pleasant 47 year old female presenting today for chronic disease follow-up.  She has severe persistent asthma that continues to be a challenge.  Having frequent flares of symptoms requiring treatment for exacerbation.  She has been to see pulmonology who recommended continuing her current regimen for now however they are interested in starting Dupixent once her tapeworm infection has cleared.  Today, she reports that she is very hesitant to consider Dupixent as this should not be used in conjunction with parasitic infections.  She is also very concerned about the black box warning that it has been placed with Singulair due to mental health concerns.  She is requesting refills on her medications today so sending in [place], albuterol inhaler, albuterol nebulizer, and Singulair 10 mg nightly.  Will defer to pulmonology for any regimen changes and further management. - albuterol (PROVENTIL) (2.5 MG/3ML) 0.083% nebulizer solution; Take 3 mLs (2.5 mg total) by nebulization every 6 (six) hours as needed for wheezing or shortness of breath.  Dispense: 150 mL; Refill: 1 - albuterol (VENTOLIN HFA) 108 (90 Base) MCG/ACT inhaler; INHALE 2 PUFFS BY MOUTH EVERY 4 HOURS AS NEEDED FOR WHEEZING - VENTOLIN DAW (PT SYMPTOMS BETTER CONTROLLED W/ BRAND NAME)  Dispense: 18 each; Refill: 11 - fluticasone-salmeterol (WIXELA INHUB) 500-50 MCG/ACT AEPB; INHALE 1 PUFF BY MOUTH TWICE A DAY  Dispense: 180 each; Refill: 1 - montelukast (SINGULAIR) 10 MG tablet; TAKE 1 TABLET BY MOUTH EVERYDAY AT BEDTIME  Dispense: 90 tablet; Refill: 1  2. Non-seasonal allergic rhinitis due to pollen As noted above, continuing Singulair.  She would like to eventually avoid taking this medication but for now, this is the only thing that is helping keep her allergies in line. - montelukast (SINGULAIR) 10 MG tablet; TAKE 1  TABLET BY MOUTH EVERYDAY AT BEDTIME  Dispense: 90 tablet; Refill: 1  3. Anxiety and depression 4. Moderately severe major depression (HCC) (Primary) Continues to deal with anxiety and depression.  Taking Lexapro 20 mg daily and feels the medication is still helpful but dealing with a lot of situational stressors.  She and her husband are still separated and there is a lot of of angst surrounding the relationship as well as work stress.  She is doing counseling through the [place].  Discussed possibly adding an adjunct to the Lexapro or switching medications but she would like to hold off for now.  Continue Lexapro 20 mg daily.  Advised her to reach out via MyChart message should she decide she wants to make a change in her regimen.  Recommend continuing therapy. - escitalopram (LEXAPRO) 20 MG tablet; Take 1 tablet (20 mg total) by mouth daily.  Dispense: 90 tablet; Refill: 1  5. Essential hypertension History of essential hypertension currently treated with hydrochlorothiazide 25 mg daily.  The medication is working well for her.  She does not check blood pressures at home unless she is dizzy with orthostasis symptoms.  Following a low-sodium diet and working on being active on a daily basis.  Blood pressure is at goal today.  Continue HCTZ 25 mg daily as prescribed. - hydrochlorothiazide (HYDRODIURIL) 25 MG tablet; Take 1 tablet (25 mg total) by mouth daily.  Dispense: 90 tablet; Refill: 1  Procedures performed this visit: None.  Return in about 6 months (around 04/01/2024) for chronic disease follow up.  __________________________________ Thayer Ohm, DNP, APRN, FNP-BC Primary Care and Sports Medicine  [facility] [place]

## 2023-10-29 DIAGNOSIS — J45909 Unspecified asthma, uncomplicated: Secondary | ICD-10-CM | POA: Diagnosis not present

## 2023-10-29 DIAGNOSIS — D509 Iron deficiency anemia, unspecified: Secondary | ICD-10-CM | POA: Diagnosis not present

## 2023-10-29 DIAGNOSIS — E039 Hypothyroidism, unspecified: Secondary | ICD-10-CM | POA: Diagnosis not present

## 2023-10-29 DIAGNOSIS — N979 Female infertility, unspecified: Secondary | ICD-10-CM | POA: Diagnosis not present

## 2023-11-09 DIAGNOSIS — N943 Premenstrual tension syndrome: Secondary | ICD-10-CM | POA: Diagnosis not present

## 2023-11-09 DIAGNOSIS — E039 Hypothyroidism, unspecified: Secondary | ICD-10-CM | POA: Diagnosis not present

## 2023-12-02 DIAGNOSIS — Z131 Encounter for screening for diabetes mellitus: Secondary | ICD-10-CM | POA: Diagnosis not present

## 2023-12-02 DIAGNOSIS — E559 Vitamin D deficiency, unspecified: Secondary | ICD-10-CM | POA: Diagnosis not present

## 2023-12-02 DIAGNOSIS — R5383 Other fatigue: Secondary | ICD-10-CM | POA: Diagnosis not present

## 2023-12-02 DIAGNOSIS — N979 Female infertility, unspecified: Secondary | ICD-10-CM | POA: Diagnosis not present

## 2023-12-02 DIAGNOSIS — E039 Hypothyroidism, unspecified: Secondary | ICD-10-CM | POA: Diagnosis not present

## 2023-12-02 DIAGNOSIS — E785 Hyperlipidemia, unspecified: Secondary | ICD-10-CM | POA: Diagnosis not present

## 2023-12-28 DIAGNOSIS — N979 Female infertility, unspecified: Secondary | ICD-10-CM | POA: Diagnosis not present

## 2023-12-28 DIAGNOSIS — J45909 Unspecified asthma, uncomplicated: Secondary | ICD-10-CM | POA: Diagnosis not present

## 2023-12-28 DIAGNOSIS — E039 Hypothyroidism, unspecified: Secondary | ICD-10-CM | POA: Diagnosis not present

## 2023-12-28 DIAGNOSIS — D509 Iron deficiency anemia, unspecified: Secondary | ICD-10-CM | POA: Diagnosis not present

## 2024-01-17 ENCOUNTER — Other Ambulatory Visit: Payer: Self-pay | Admitting: Medical-Surgical

## 2024-02-05 ENCOUNTER — Other Ambulatory Visit: Payer: Self-pay | Admitting: Medical-Surgical

## 2024-02-05 DIAGNOSIS — F32A Depression, unspecified: Secondary | ICD-10-CM

## 2024-02-05 DIAGNOSIS — J4550 Severe persistent asthma, uncomplicated: Secondary | ICD-10-CM

## 2024-02-05 DIAGNOSIS — J301 Allergic rhinitis due to pollen: Secondary | ICD-10-CM

## 2024-02-05 DIAGNOSIS — I10 Essential (primary) hypertension: Secondary | ICD-10-CM

## 2024-03-07 DIAGNOSIS — R7989 Other specified abnormal findings of blood chemistry: Secondary | ICD-10-CM | POA: Diagnosis not present

## 2024-03-07 DIAGNOSIS — E039 Hypothyroidism, unspecified: Secondary | ICD-10-CM | POA: Diagnosis not present

## 2024-03-07 DIAGNOSIS — N943 Premenstrual tension syndrome: Secondary | ICD-10-CM | POA: Diagnosis not present

## 2024-03-07 DIAGNOSIS — R5383 Other fatigue: Secondary | ICD-10-CM | POA: Diagnosis not present

## 2024-03-18 ENCOUNTER — Encounter: Payer: Self-pay | Admitting: Physician Assistant

## 2024-03-18 ENCOUNTER — Ambulatory Visit: Admitting: Physician Assistant

## 2024-03-18 ENCOUNTER — Ambulatory Visit: Payer: Self-pay

## 2024-03-18 VITALS — BP 105/75 | HR 76 | Temp 98.6°F | Ht 66.0 in | Wt 157.0 lb

## 2024-03-18 DIAGNOSIS — J329 Chronic sinusitis, unspecified: Secondary | ICD-10-CM | POA: Diagnosis not present

## 2024-03-18 DIAGNOSIS — J40 Bronchitis, not specified as acute or chronic: Secondary | ICD-10-CM

## 2024-03-18 MED ORDER — AZITHROMYCIN 250 MG PO TABS: ORAL_TABLET | ORAL | 0 refills | Status: DC

## 2024-03-18 MED ORDER — METHYLPREDNISOLONE 4 MG PO TBPK: ORAL_TABLET | ORAL | 0 refills | Status: DC

## 2024-03-18 NOTE — Telephone Encounter (Signed)
 FYI Only or Action Required?: FYI only for provider  Patient was last seen in primary care on 10/02/2023 by Toni Cornish, NP. Called Nurse Triage reporting Cough. Symptoms began several days ago. Interventions attempted: OTC medications: Dayquil and Prescription medications: inhalers. Symptoms are: unchanged.  Triage Disposition: See HCP Within 4 Hours (Or PCP Triage)  Patient/caregiver understands and will follow disposition?: Yes                   Copied from CRM [id number]. Topic: Clinical - Red Word Triage >> Mar 18, 2024 11:18 AM Kevelyn M wrote: Red Word that prompted transfer to Nurse Triage: Bad headache and sinus pressure Coughing up phlegm Covid Flu test negative Symptoms since Wednesday. Reason for Disposition  Wheezing is present  Answer Assessment - Initial Assessment Questions 1. ONSET: When did the cough begin?      wednesday 2. SEVERITY: How bad is the cough today?      mod 3. SPUTUM: Describe the color of your sputum (none, dry cough; clear, white, yellow, green)     yellow 4. HEMOPTYSIS: Are you coughing up any blood? If so ask: How much? (flecks, streaks, tablespoons, etc.)     no 5. DIFFICULTY BREATHING: Are you having difficulty breathing? If Yes, ask: How bad is it? (e.g., mild, moderate, severe)    - MILD: No SOB at rest, mild SOB with walking, speaks normally in sentences, can lie down, no retractions, pulse < 100.    - MODERATE: SOB at rest, SOB with minimal exertion and prefers to sit, cannot lie down flat, speaks in phrases, mild retractions, audible wheezing, pulse 100-120.    - SEVERE: Very SOB at rest, speaks in single words, struggling to breathe, sitting hunched forward, retractions, pulse > 120      mild 6. FEVER: Do you have a fever? If Yes, ask: What is your temperature, how was it measured, and when did it start?     Havent taken temp 7. CARDIAC HISTORY: Do you have any history of heart disease? (e.g., heart attack,  congestive heart failure)      no 8. LUNG HISTORY: Do you have any history of lung disease?  (e.g., pulmonary embolus, asthma, emphysema)     asthma 9. PE RISK FACTORS: Do you have a history of blood clots? (or: recent major surgery, recent prolonged travel, bedridden)     no 10. OTHER SYMPTOMS: Do you have any other symptoms? (e.g., runny nose, wheezing, chest pain)       Headache, sore throat, sinus pressure, wheezing, congestion  Protocols used: Cough - Acute Productive-A-AH

## 2024-03-18 NOTE — Progress Notes (Unsigned)
   Acute Office Visit  Subjective:     Patient ID: Toni Harmon, female    DOB: 12/15/1975, 48 y.o.   MRN: [id number]  No chief complaint on file.   HPI Patient is in today for productive cough, congestion, chest tightness for the last week. She has severe asthma and takes wixella and spiriva  daily. She has been using her rescue inhaler more often at least 2-3 times a day. She is taking dayquil and nyquil. She denies any fever, chills, body aches. She tested negative with home test for flu and covid.   ROS See HPI.     Objective:    BP 105/75   Pulse 76   Temp 98.6 F (37 C) (Oral)   Ht 5' 6 (1.676 m)   Wt 157 lb (71.2 kg)   SpO2 100%   BMI 25.34 kg/m  BP Readings from Last 3 Encounters:  03/18/24 105/75  10/02/23 107/73  09/24/23 138/85   Wt Readings from Last 3 Encounters:  03/18/24 157 lb (71.2 kg)  10/02/23 148 lb 4.8 oz (67.3 kg)  09/15/23 148 lb 3.2 oz (67.2 kg)      Physical Exam Constitutional:      Appearance: Normal appearance.  HENT:     Head: Normocephalic.     Comments: Tenderness over maxillary and frontal sinuses to palpation.     Right Ear: Tympanic membrane normal. There is no impacted cerumen.     Left Ear: Tympanic membrane normal. There is no impacted cerumen.     Ears:     Comments: Dull TMs, bilaterally.     Nose: Nose normal.     Mouth/Throat:     Mouth: Mucous membranes are moist.     Pharynx: Posterior oropharyngeal erythema present. No oropharyngeal exudate.   Eyes:     Conjunctiva/sclera: Conjunctivae normal.    Cardiovascular:     Rate and Rhythm: Normal rate and regular rhythm.     Pulses: Normal pulses.  Pulmonary:     Effort: Pulmonary effort is normal.     Breath sounds: Normal breath sounds. No wheezing or rhonchi.   Musculoskeletal:     Cervical back: Normal range of motion and neck supple. No tenderness.     Right lower leg: No edema.     Left lower leg: No edema.  Lymphadenopathy:     Cervical: Cervical  adenopathy present.   Neurological:     General: No focal deficit present.     Mental Status: She is alert and oriented to person, place, and time.   Psychiatric:        Mood and Affect: Mood normal.          Assessment & Plan:  Aaron AasAaron AasDiagnoses and all orders for this visit:  Sinobronchitis -     azithromycin  (ZITHROMAX  Z-PAK) 250 MG tablet; Take 2 tablets (500 mg) on  Day 1,  followed by 1 tablet (250 mg) once daily on Days 2 through 5. -     methylPREDNISolone  (MEDROL  DOSEPAK) 4 MG TBPK tablet; Take as directed by package insert.   Vitals reassuring  Pulse ox 100 percent Continue asthma maintain medication Added zpak and medrol  dose pack Continue to use rescue inhaler as needed every 2-4 hours Rest and hydrate Follow up if not improving or if symptoms worsening    Sandy Crumb, PA-C

## 2024-03-18 NOTE — Patient Instructions (Signed)

## 2024-03-21 ENCOUNTER — Encounter: Payer: Self-pay | Admitting: Physician Assistant

## 2024-03-22 ENCOUNTER — Other Ambulatory Visit: Payer: Self-pay | Admitting: Medical-Surgical

## 2024-04-04 ENCOUNTER — Encounter: Payer: Self-pay | Admitting: Medical-Surgical

## 2024-04-04 ENCOUNTER — Ambulatory Visit: Payer: Federal, State, Local not specified - PPO | Admitting: Medical-Surgical

## 2024-04-04 VITALS — BP 114/75 | HR 67 | Resp 20 | Ht 66.0 in | Wt 160.0 lb

## 2024-04-04 DIAGNOSIS — F322 Major depressive disorder, single episode, severe without psychotic features: Secondary | ICD-10-CM | POA: Diagnosis not present

## 2024-04-04 DIAGNOSIS — E039 Hypothyroidism, unspecified: Secondary | ICD-10-CM | POA: Diagnosis not present

## 2024-04-04 DIAGNOSIS — F32A Depression, unspecified: Secondary | ICD-10-CM

## 2024-04-04 DIAGNOSIS — J4550 Severe persistent asthma, uncomplicated: Secondary | ICD-10-CM

## 2024-04-04 DIAGNOSIS — F419 Anxiety disorder, unspecified: Secondary | ICD-10-CM

## 2024-04-04 DIAGNOSIS — I10 Essential (primary) hypertension: Secondary | ICD-10-CM

## 2024-04-04 DIAGNOSIS — J301 Allergic rhinitis due to pollen: Secondary | ICD-10-CM

## 2024-04-04 DIAGNOSIS — N979 Female infertility, unspecified: Secondary | ICD-10-CM | POA: Diagnosis not present

## 2024-04-04 DIAGNOSIS — D509 Iron deficiency anemia, unspecified: Secondary | ICD-10-CM | POA: Diagnosis not present

## 2024-04-04 DIAGNOSIS — Z91010 Allergy to peanuts: Secondary | ICD-10-CM

## 2024-04-04 DIAGNOSIS — J45909 Unspecified asthma, uncomplicated: Secondary | ICD-10-CM | POA: Diagnosis not present

## 2024-04-04 MED ORDER — FLUTICASONE-SALMETEROL 500-50 MCG/ACT IN AEPB: INHALATION_SPRAY | RESPIRATORY_TRACT | 3 refills | Status: AC

## 2024-04-04 MED ORDER — MONTELUKAST SODIUM 10 MG PO TABS: ORAL_TABLET | ORAL | 3 refills | Status: AC

## 2024-04-04 MED ORDER — HYDROCHLOROTHIAZIDE 25 MG PO TABS: 25.0000 mg | ORAL_TABLET | Freq: Every day | ORAL | 3 refills | Status: AC

## 2024-04-04 MED ORDER — ALBUTEROL SULFATE HFA 108 (90 BASE) MCG/ACT IN AERS: INHALATION_SPRAY | RESPIRATORY_TRACT | 11 refills | Status: AC

## 2024-04-04 MED ORDER — ALBUTEROL SULFATE (2.5 MG/3ML) 0.083% IN NEBU: 2.5000 mg | INHALATION_SOLUTION | Freq: Four times a day (QID) | RESPIRATORY_TRACT | 3 refills | Status: AC | PRN

## 2024-04-04 MED ORDER — BUDESONIDE 32 MCG/ACT NA SUSP: 2.0000 | Freq: Every day | NASAL | 11 refills | Status: AC

## 2024-04-04 MED ORDER — EPINEPHRINE 0.3 MG/0.3ML IJ SOAJ: 0.3000 mg | INTRAMUSCULAR | 5 refills | Status: AC | PRN

## 2024-04-04 MED ORDER — ESCITALOPRAM OXALATE 20 MG PO TABS: 20.0000 mg | ORAL_TABLET | Freq: Every day | ORAL | 3 refills | Status: AC

## 2024-04-04 NOTE — Progress Notes (Unsigned)
        Established patient visit  History, exam, impression, and plan:  1. LUQ pain Pleasant 48 year old female presenting today with reports of LUQ pain that is described as a dull nagging ache. Feels similar to pain that she has experienced from her gall bladder. Known history of gall stones but has not had her gall bladder removed. LUQ pain is worse with eating fatty, greasy foods. Occasional nausea but no vomiting. No change in bowel habits. No fever/chills. Exam benign with no reproducible tenderness. No HSM. Bowel sounds present throughout. Unclear etiology. Differentials include but not limited to referred pain from gall bladder, splenic flexure syndrome, spleen etiology, pancreatic inflammation, or constipation. Checking labs as below. Getting CT abd/pelvis for further evaluation.  - CBC with Differential/Platelet - CMP14+EGFR - Lipase - Amylase - CT ABDOMEN PELVIS WO CONTRAST; Future  2. Moderate persistent asthma, unspecified whether complicated Hx of asthma currently well controlled. Refilling Flovent  inhaler per patient request.  - fluticasone  (FLOVENT  HFA) 110 MCG/ACT inhaler; INHALE 2 PUFFS INTO THE LUNGS 2 (TWO) TIMES DAILY. FOR COUGH  Dispense: 12 each; Refill: 3   Procedures performed this visit: None.  Return if symptoms worsen or fail to improve.  __________________________________ Toni FREDRIK Palin, DNP, APRN, FNP-BC Primary Care and Sports Medicine [facility] [place]

## 2024-05-22 ENCOUNTER — Other Ambulatory Visit: Payer: Self-pay | Admitting: Medical-Surgical

## 2024-07-05 DIAGNOSIS — Z131 Encounter for screening for diabetes mellitus: Secondary | ICD-10-CM | POA: Diagnosis not present

## 2024-07-05 DIAGNOSIS — E559 Vitamin D deficiency, unspecified: Secondary | ICD-10-CM | POA: Diagnosis not present

## 2024-07-05 DIAGNOSIS — E039 Hypothyroidism, unspecified: Secondary | ICD-10-CM | POA: Diagnosis not present

## 2024-07-05 DIAGNOSIS — R5383 Other fatigue: Secondary | ICD-10-CM | POA: Diagnosis not present

## 2024-07-23 ENCOUNTER — Other Ambulatory Visit: Payer: Self-pay | Admitting: Medical-Surgical

## 2024-07-25 DIAGNOSIS — E039 Hypothyroidism, unspecified: Secondary | ICD-10-CM | POA: Diagnosis not present

## 2024-07-25 DIAGNOSIS — D509 Iron deficiency anemia, unspecified: Secondary | ICD-10-CM | POA: Diagnosis not present

## 2024-07-25 DIAGNOSIS — J45909 Unspecified asthma, uncomplicated: Secondary | ICD-10-CM | POA: Diagnosis not present

## 2024-07-25 DIAGNOSIS — N943 Premenstrual tension syndrome: Secondary | ICD-10-CM | POA: Diagnosis not present

## 2024-08-12 ENCOUNTER — Encounter: Payer: Self-pay | Admitting: Medical-Surgical

## 2024-08-12 ENCOUNTER — Ambulatory Visit (INDEPENDENT_AMBULATORY_CARE_PROVIDER_SITE_OTHER): Admitting: Medical-Surgical

## 2024-08-12 VITALS — BP 95/64 | HR 78 | Resp 20 | Ht 66.0 in | Wt 157.0 lb

## 2024-08-12 DIAGNOSIS — I10 Essential (primary) hypertension: Secondary | ICD-10-CM

## 2024-08-12 DIAGNOSIS — Z1231 Encounter for screening mammogram for malignant neoplasm of breast: Secondary | ICD-10-CM

## 2024-08-12 DIAGNOSIS — F419 Anxiety disorder, unspecified: Secondary | ICD-10-CM

## 2024-08-12 DIAGNOSIS — F32A Depression, unspecified: Secondary | ICD-10-CM | POA: Diagnosis not present

## 2024-08-12 NOTE — Progress Notes (Signed)
   Established Patient Office Visit  Subjective   Patient ID: Toni Harmon, female    DOB: September 12, 1976  Age: 48 y.o. MRN: [id number]  Chief Complaint  Patient presents with   Form Completion    FMLA     HPI  48 year old female presents to discuss updating FMLA paperwork for anxiety and panic attacks. She is currently taking Lexapro  20 mg daily for her anxiety. She states that she continues to have issues with getting started in the morning due to over thinking. Denies having side effects to her medications. Current FMLA include 1-3 episodic days per month with a 1-5 day recovery. Patient would like to continue with the current plan.  Review of Systems  Constitutional: Negative.   HENT: Negative.    Eyes: Negative.   Respiratory: Negative.    Cardiovascular: Negative.   Gastrointestinal: Negative.   Genitourinary: Negative.   Musculoskeletal: Negative.   Skin: Negative.   Neurological: Negative.   Endo/Heme/Allergies: Negative.   Psychiatric/Behavioral:  The patient is nervous/anxious.       Objective:     BP 95/64 (BP Location: Left Arm, Cuff Size: Normal)   Pulse 78   Resp 20   Ht 5' 6 (1.676 m)   Wt 71.2 kg   SpO2 98%   BMI 25.35 kg/m  BP Readings from Last 3 Encounters:  08/12/24 95/64  04/04/24 114/75  03/18/24 105/75      Physical Exam Vitals and nursing note reviewed.  Constitutional:      General: She is not in acute distress.    Appearance: Normal appearance.  Cardiovascular:     Rate and Rhythm: Normal rate and regular rhythm.     Pulses: Normal pulses.     Heart sounds: Normal heart sounds.  Pulmonary:     Effort: Pulmonary effort is normal.     Breath sounds: Normal breath sounds.  Neurological:     General: No focal deficit present.     Mental Status: She is alert and oriented to person, place, and time.  Psychiatric:        Mood and Affect: Mood normal.        Behavior: Behavior normal.        Thought Content: Thought content  normal.        Judgment: Judgment normal.    No results found for any visits on 08/12/24.   The 10-year ASCVD risk score (Arnett DK, et al., 2019) is: 0.5%    Assessment & Plan:   1. Anxiety and depression (Primary) -Continue on Lexapro  20 mg daily -FMLA paperwork updated   2. Essential hypertension -Taper Hydrochlorothiazide  down to 12.5 mg daily, and then as needed for elevated blood pressure -Monitor blood pressures at home 2-3 times weekly  3. Screening mammogram for breast cancer -Referral placed for mammogram    Derrek JINNY Freund, NP Student

## 2024-08-12 NOTE — Progress Notes (Signed)
 Medical screening examination/treatment was performed by qualified clinical staff member and as supervising provider I was immediately available for consultation/collaboration. I have reviewed documentation and agree with assessment and plan.  Thayer Ohm, DNP, APRN, FNP-BC [facility] and Sports Medicine

## 2024-08-16 ENCOUNTER — Telehealth: Payer: Self-pay

## 2024-08-16 NOTE — Telephone Encounter (Signed)
 We do not have a record of a mammogram. Left message for a return call.   Patient may have had a mammogram from an outside source.

## 2024-09-26 ENCOUNTER — Encounter: Payer: Self-pay | Admitting: Medical-Surgical

## 2024-09-26 ENCOUNTER — Ambulatory Visit: Admitting: Medical-Surgical

## 2024-09-26 VITALS — BP 109/79 | HR 71 | Temp 97.7°F | Resp 16 | Ht 66.0 in | Wt 157.1 lb

## 2024-09-26 DIAGNOSIS — B9689 Other specified bacterial agents as the cause of diseases classified elsewhere: Secondary | ICD-10-CM | POA: Diagnosis not present

## 2024-09-26 DIAGNOSIS — J019 Acute sinusitis, unspecified: Secondary | ICD-10-CM | POA: Diagnosis not present

## 2024-09-26 DIAGNOSIS — F419 Anxiety disorder, unspecified: Secondary | ICD-10-CM | POA: Diagnosis not present

## 2024-09-26 DIAGNOSIS — I10 Essential (primary) hypertension: Secondary | ICD-10-CM | POA: Diagnosis not present

## 2024-09-26 DIAGNOSIS — F32A Depression, unspecified: Secondary | ICD-10-CM | POA: Diagnosis not present

## 2024-09-26 MED ORDER — PREDNISONE 10 MG (21) PO TBPK: ORAL_TABLET | ORAL | 0 refills | Status: AC

## 2024-09-26 MED ORDER — CEFDINIR 300 MG PO CAPS: 300.0000 mg | ORAL_CAPSULE | Freq: Two times a day (BID) | ORAL | 0 refills | Status: AC

## 2024-09-26 NOTE — Progress Notes (Signed)
 "  Established Patient Office Visit  Subjective   Patient ID: Toni Harmon, female    DOB: March 12, 1976  Age: 48 y.o. MRN: [id number]  Chief Complaint  Patient presents with   Anxiety   Depression    Anxiety    Depression        Past medical history includes anxiety.     48 year old female presents for 6 month chronic disease follow up  Essential Hypertension Patient is currently taking hydrochlorothiazide  25 mg daily. Patient is doing well on current dose. Does not routinely check her blood pressures at home. Denies having any side effects to her medication.  Anxiety and Depression Patient is currently taking Lexapro  20 mg daily. Patient states that she is doing well on current dose. Denies changes in mood.  Patient reports sinus congestion, sore throat, bilateral ear pressure, and chest tightness. Sinus congestion started about month ago, but treated with otc allergy meds an nasal decongestants (Afrin and sudafed) with some relief.  She states that symptoms are progressively getting worse. Also complains of head pressure Reports having a metallic taste in her mouth.. Additional sx started 3 days ago. Denies discolored mucus, fever, or chills.  Review of Systems  Constitutional: Negative.   HENT:  Positive for congestion, ear pain and sore throat.   Eyes: Negative.   Respiratory: Negative.         Chest tightness  Cardiovascular: Negative.   Gastrointestinal: Negative.   Genitourinary: Negative.   Musculoskeletal: Negative.   Skin: Negative.   Neurological: Negative.   Endo/Heme/Allergies: Negative.   Psychiatric/Behavioral:  Positive for depression.       Objective:     BP 109/79 (BP Location: Right Arm, Patient Position: Sitting, Cuff Size: Normal)   Pulse 71   Temp 97.7 F (36.5 C) (Oral)   Resp 16   Ht 5' 6 (1.676 m)   Wt 71.3 kg   SpO2 100%   BMI 25.35 kg/m  BP Readings from Last 3 Encounters:  09/26/24 109/79  08/12/24 95/64  04/04/24 114/75    Wt Readings from Last 3 Encounters:  09/26/24 71.3 kg  08/12/24 71.2 kg  04/04/24 72.6 kg      Physical Exam Vitals and nursing note reviewed.  Constitutional:      General: She is not in acute distress.    Appearance: Normal appearance.  HENT:     Right Ear: Tympanic membrane, ear canal and external ear normal. There is no impacted cerumen.     Left Ear: Tympanic membrane, ear canal and external ear normal. There is no impacted cerumen.     Nose: Congestion present.     Mouth/Throat:     Mouth: Mucous membranes are moist.     Pharynx: Oropharynx is clear.  Cardiovascular:     Rate and Rhythm: Normal rate and regular rhythm.     Pulses: Normal pulses.     Heart sounds: Normal heart sounds.  Pulmonary:     Effort: Pulmonary effort is normal.     Breath sounds: Normal breath sounds.  Neurological:     General: No focal deficit present.     Mental Status: She is alert and oriented to person, place, and time.  Psychiatric:        Mood and Affect: Mood normal.        Behavior: Behavior normal.        Thought Content: Thought content normal.        Judgment: Judgment normal.  No results found for any visits on 09/26/24.   The 10-year ASCVD risk score (Arnett DK, et al., 2019) is: 0.7%    Assessment & Plan:   1. Essential hypertension (Primary) -Continue with current dosage hydrochlorothiazide  25 mg -Continue to monitor BP at home   2. Anxiety and depression -Continue with current plan Lexapro  20 mg daily   3. Acute bacterial rhinosinusitis -Rx for Cefidinir and prednisone  to treat for bacterial sinusitis -Recommended conservative management for other symptoms    Return in about 6 months (around 03/27/2025) for Chronic Disease Follow Up.    Derrek JINNY Freund, NP Student  "

## 2024-09-26 NOTE — Progress Notes (Signed)
 Medical screening examination/treatment was performed by qualified clinical staff member and as supervising provider I was immediately available for consultation/collaboration. I have reviewed documentation and agree with assessment and plan.  Thayer Ohm, DNP, APRN, FNP-BC [facility] and Sports Medicine

## 2024-09-29 ENCOUNTER — Other Ambulatory Visit: Payer: Self-pay | Admitting: Medical-Surgical

## 2025-03-27 ENCOUNTER — Ambulatory Visit: Admitting: Medical-Surgical
